# Patient Record
Sex: Female | Born: 1980 | Race: Black or African American | Hispanic: No | Marital: Single | Smoking: Never smoker
Health system: Southern US, Community
[De-identification: ages and names within clinical notes are randomized; demographics above are authoritative.]

## PROBLEM LIST (undated history)

## (undated) ENCOUNTER — Inpatient Hospital Stay (HOSPITAL_COMMUNITY): Payer: Self-pay

## (undated) DIAGNOSIS — G43909 Migraine, unspecified, not intractable, without status migrainosus: Secondary | ICD-10-CM

## (undated) DIAGNOSIS — I10 Essential (primary) hypertension: Secondary | ICD-10-CM

## (undated) HISTORY — PX: CHOLECYSTECTOMY: SHX55

---
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
## 2001-01-02 ENCOUNTER — Encounter: Admission: RE | Admit: 2001-01-02 | Discharge: 2001-01-02 | Payer: Self-pay | Admitting: Family Medicine

## 2001-02-09 ENCOUNTER — Encounter: Admission: RE | Admit: 2001-02-09 | Discharge: 2001-02-09 | Payer: Self-pay | Admitting: Sports Medicine

## 2001-02-13 ENCOUNTER — Emergency Department (HOSPITAL_COMMUNITY): Admission: EM | Admit: 2001-02-13 | Discharge: 2001-02-13 | Payer: Self-pay | Admitting: Emergency Medicine

## 2001-02-21 ENCOUNTER — Encounter: Admission: RE | Admit: 2001-02-21 | Discharge: 2001-02-21 | Payer: Self-pay | Admitting: Sports Medicine

## 2001-03-14 ENCOUNTER — Encounter: Admission: RE | Admit: 2001-03-14 | Discharge: 2001-03-14 | Payer: Self-pay | Admitting: Sports Medicine

## 2001-04-12 ENCOUNTER — Encounter: Admission: RE | Admit: 2001-04-12 | Discharge: 2001-04-12 | Payer: Self-pay | Admitting: Family Medicine

## 2001-04-19 ENCOUNTER — Encounter: Admission: RE | Admit: 2001-04-19 | Discharge: 2001-04-19 | Payer: Self-pay | Admitting: Family Medicine

## 2001-07-14 ENCOUNTER — Encounter: Admission: RE | Admit: 2001-07-14 | Discharge: 2001-07-14 | Payer: Self-pay | Admitting: Family Medicine

## 2001-10-09 ENCOUNTER — Encounter: Admission: RE | Admit: 2001-10-09 | Discharge: 2001-10-09 | Payer: Self-pay | Admitting: Sports Medicine

## 2002-01-01 ENCOUNTER — Encounter: Admission: RE | Admit: 2002-01-01 | Discharge: 2002-01-01 | Payer: Self-pay | Admitting: Family Medicine

## 2002-02-02 ENCOUNTER — Encounter: Admission: RE | Admit: 2002-02-02 | Discharge: 2002-02-02 | Payer: Self-pay | Admitting: Family Medicine

## 2002-03-27 ENCOUNTER — Encounter: Admission: RE | Admit: 2002-03-27 | Discharge: 2002-03-27 | Payer: Self-pay | Admitting: Family Medicine

## 2002-03-29 ENCOUNTER — Encounter: Payer: Self-pay | Admitting: Emergency Medicine

## 2002-03-29 ENCOUNTER — Emergency Department (HOSPITAL_COMMUNITY): Admission: EM | Admit: 2002-03-29 | Discharge: 2002-03-29 | Payer: Self-pay | Admitting: Emergency Medicine

## 2002-06-20 ENCOUNTER — Encounter: Admission: RE | Admit: 2002-06-20 | Discharge: 2002-06-20 | Payer: Self-pay | Admitting: Family Medicine

## 2002-06-25 ENCOUNTER — Encounter: Admission: RE | Admit: 2002-06-25 | Discharge: 2002-06-25 | Payer: Self-pay | Admitting: Family Medicine

## 2002-07-11 ENCOUNTER — Encounter: Admission: RE | Admit: 2002-07-11 | Discharge: 2002-07-11 | Payer: Self-pay | Admitting: Family Medicine

## 2002-08-23 ENCOUNTER — Encounter: Admission: RE | Admit: 2002-08-23 | Discharge: 2002-08-23 | Payer: Self-pay | Admitting: Family Medicine

## 2002-08-29 ENCOUNTER — Encounter: Admission: RE | Admit: 2002-08-29 | Discharge: 2002-08-29 | Payer: Self-pay | Admitting: Family Medicine

## 2002-09-18 ENCOUNTER — Encounter: Admission: RE | Admit: 2002-09-18 | Discharge: 2002-09-18 | Payer: Self-pay | Admitting: Family Medicine

## 2002-10-10 ENCOUNTER — Encounter: Admission: RE | Admit: 2002-10-10 | Discharge: 2002-10-10 | Payer: Self-pay | Admitting: Sports Medicine

## 2002-10-10 ENCOUNTER — Encounter: Payer: Self-pay | Admitting: Sports Medicine

## 2002-10-10 ENCOUNTER — Encounter: Admission: RE | Admit: 2002-10-10 | Discharge: 2002-10-10 | Payer: Self-pay | Admitting: Family Medicine

## 2002-10-11 ENCOUNTER — Encounter: Admission: RE | Admit: 2002-10-11 | Discharge: 2002-10-11 | Payer: Self-pay | Admitting: Family Medicine

## 2002-12-13 ENCOUNTER — Encounter: Admission: RE | Admit: 2002-12-13 | Discharge: 2002-12-13 | Payer: Self-pay | Admitting: Sports Medicine

## 2003-02-28 ENCOUNTER — Encounter: Admission: RE | Admit: 2003-02-28 | Discharge: 2003-02-28 | Payer: Self-pay | Admitting: Family Medicine

## 2003-03-13 ENCOUNTER — Encounter: Admission: RE | Admit: 2003-03-13 | Discharge: 2003-03-13 | Payer: Self-pay | Admitting: Family Medicine

## 2003-05-12 ENCOUNTER — Emergency Department (HOSPITAL_COMMUNITY): Admission: EM | Admit: 2003-05-12 | Discharge: 2003-05-12 | Payer: Self-pay | Admitting: Emergency Medicine

## 2003-05-20 ENCOUNTER — Encounter: Admission: RE | Admit: 2003-05-20 | Discharge: 2003-05-20 | Payer: Self-pay | Admitting: Family Medicine

## 2003-06-26 ENCOUNTER — Encounter: Admission: RE | Admit: 2003-06-26 | Discharge: 2003-06-26 | Payer: Self-pay | Admitting: Family Medicine

## 2003-08-05 ENCOUNTER — Encounter: Admission: RE | Admit: 2003-08-05 | Discharge: 2003-08-05 | Payer: Self-pay | Admitting: Family Medicine

## 2003-10-10 ENCOUNTER — Encounter: Admission: RE | Admit: 2003-10-10 | Discharge: 2003-10-10 | Payer: Self-pay | Admitting: Sports Medicine

## 2003-10-24 ENCOUNTER — Encounter: Admission: RE | Admit: 2003-10-24 | Discharge: 2003-10-24 | Payer: Self-pay | Admitting: Sports Medicine

## 2003-11-18 ENCOUNTER — Encounter: Admission: RE | Admit: 2003-11-18 | Discharge: 2003-11-18 | Payer: Self-pay | Admitting: Family Medicine

## 2003-12-06 ENCOUNTER — Encounter: Admission: RE | Admit: 2003-12-06 | Discharge: 2003-12-06 | Payer: Self-pay | Admitting: Family Medicine

## 2003-12-28 ENCOUNTER — Emergency Department (HOSPITAL_COMMUNITY): Admission: EM | Admit: 2003-12-28 | Discharge: 2003-12-28 | Payer: Self-pay | Admitting: Family Medicine

## 2004-01-22 ENCOUNTER — Encounter: Admission: RE | Admit: 2004-01-22 | Discharge: 2004-01-22 | Payer: Self-pay | Admitting: Sports Medicine

## 2004-02-06 ENCOUNTER — Encounter: Admission: RE | Admit: 2004-02-06 | Discharge: 2004-02-06 | Payer: Self-pay | Admitting: Sports Medicine

## 2004-04-22 ENCOUNTER — Ambulatory Visit: Payer: Self-pay | Admitting: Family Medicine

## 2004-07-22 ENCOUNTER — Ambulatory Visit: Payer: Self-pay | Admitting: Family Medicine

## 2004-10-21 ENCOUNTER — Ambulatory Visit: Payer: Self-pay | Admitting: Family Medicine

## 2005-01-12 ENCOUNTER — Emergency Department (HOSPITAL_COMMUNITY): Admission: EM | Admit: 2005-01-12 | Discharge: 2005-01-12 | Payer: Self-pay | Admitting: Emergency Medicine

## 2005-01-19 ENCOUNTER — Ambulatory Visit: Payer: Self-pay | Admitting: Family Medicine

## 2005-01-28 ENCOUNTER — Ambulatory Visit: Payer: Self-pay | Admitting: Family Medicine

## 2005-04-15 ENCOUNTER — Ambulatory Visit: Payer: Self-pay | Admitting: Family Medicine

## 2005-06-08 ENCOUNTER — Emergency Department (HOSPITAL_COMMUNITY): Admission: EM | Admit: 2005-06-08 | Discharge: 2005-06-08 | Payer: Self-pay | Admitting: Family Medicine

## 2005-07-12 ENCOUNTER — Ambulatory Visit: Payer: Self-pay | Admitting: Family Medicine

## 2005-10-11 ENCOUNTER — Ambulatory Visit: Payer: Self-pay | Admitting: Family Medicine

## 2005-10-22 ENCOUNTER — Ambulatory Visit: Payer: Self-pay | Admitting: Family Medicine

## 2005-12-12 ENCOUNTER — Emergency Department (HOSPITAL_COMMUNITY): Admission: EM | Admit: 2005-12-12 | Discharge: 2005-12-12 | Payer: Self-pay | Admitting: Family Medicine

## 2005-12-28 ENCOUNTER — Ambulatory Visit (HOSPITAL_COMMUNITY): Admission: RE | Admit: 2005-12-28 | Discharge: 2005-12-28 | Payer: Self-pay | Admitting: Family Medicine

## 2005-12-28 ENCOUNTER — Ambulatory Visit: Payer: Self-pay | Admitting: Family Medicine

## 2006-01-03 ENCOUNTER — Ambulatory Visit: Payer: Self-pay | Admitting: Family Medicine

## 2006-01-14 ENCOUNTER — Ambulatory Visit: Payer: Self-pay | Admitting: Sports Medicine

## 2006-03-28 ENCOUNTER — Ambulatory Visit: Payer: Self-pay | Admitting: Sports Medicine

## 2006-04-08 ENCOUNTER — Ambulatory Visit: Payer: Self-pay | Admitting: Family Medicine

## 2006-08-11 DIAGNOSIS — F329 Major depressive disorder, single episode, unspecified: Secondary | ICD-10-CM

## 2006-08-11 DIAGNOSIS — K649 Unspecified hemorrhoids: Secondary | ICD-10-CM

## 2006-08-12 ENCOUNTER — Encounter (INDEPENDENT_AMBULATORY_CARE_PROVIDER_SITE_OTHER): Payer: Self-pay | Admitting: *Deleted

## 2006-09-28 ENCOUNTER — Encounter (INDEPENDENT_AMBULATORY_CARE_PROVIDER_SITE_OTHER): Payer: Self-pay | Admitting: *Deleted

## 2006-09-28 ENCOUNTER — Ambulatory Visit: Payer: Self-pay | Admitting: Family Medicine

## 2006-12-02 ENCOUNTER — Telehealth: Payer: Self-pay | Admitting: *Deleted

## 2006-12-25 ENCOUNTER — Telehealth (INDEPENDENT_AMBULATORY_CARE_PROVIDER_SITE_OTHER): Payer: Self-pay | Admitting: Family Medicine

## 2007-05-10 ENCOUNTER — Encounter (INDEPENDENT_AMBULATORY_CARE_PROVIDER_SITE_OTHER): Payer: Self-pay | Admitting: *Deleted

## 2007-05-10 ENCOUNTER — Ambulatory Visit: Payer: Self-pay | Admitting: Family Medicine

## 2007-05-28 ENCOUNTER — Emergency Department (HOSPITAL_COMMUNITY): Admission: EM | Admit: 2007-05-28 | Discharge: 2007-05-28 | Payer: Self-pay | Admitting: Emergency Medicine

## 2007-06-20 ENCOUNTER — Telehealth (INDEPENDENT_AMBULATORY_CARE_PROVIDER_SITE_OTHER): Payer: Self-pay | Admitting: Family Medicine

## 2007-06-21 ENCOUNTER — Ambulatory Visit: Payer: Self-pay

## 2007-07-14 ENCOUNTER — Encounter: Payer: Self-pay | Admitting: *Deleted

## 2007-07-14 ENCOUNTER — Telehealth: Payer: Self-pay | Admitting: *Deleted

## 2007-07-14 ENCOUNTER — Ambulatory Visit: Payer: Self-pay | Admitting: Family Medicine

## 2007-07-14 ENCOUNTER — Encounter (INDEPENDENT_AMBULATORY_CARE_PROVIDER_SITE_OTHER): Payer: Self-pay | Admitting: Family Medicine

## 2007-07-14 LAB — CONVERTED CEMR LAB
Antibody Screen: NEGATIVE
Basophils Absolute: 0 10*3/uL (ref 0.0–0.1)
Basophils Relative: 0 % (ref 0–1)
Eosinophils Relative: 2 % (ref 0–5)
HCT: 36.2 % (ref 36.0–46.0)
Hemoglobin: 12.1 g/dL (ref 12.0–15.0)
MCHC: 33.4 g/dL (ref 30.0–36.0)
MCV: 87.7 fL (ref 78.0–100.0)
Monocytes Absolute: 1 10*3/uL (ref 0.1–1.0)
Monocytes Relative: 8 % (ref 3–12)
RDW: 14.9 % (ref 11.5–15.5)

## 2007-07-15 ENCOUNTER — Encounter (INDEPENDENT_AMBULATORY_CARE_PROVIDER_SITE_OTHER): Payer: Self-pay | Admitting: Family Medicine

## 2007-07-19 ENCOUNTER — Telehealth: Payer: Self-pay | Admitting: *Deleted

## 2007-07-19 ENCOUNTER — Encounter (INDEPENDENT_AMBULATORY_CARE_PROVIDER_SITE_OTHER): Payer: Self-pay | Admitting: *Deleted

## 2007-07-20 ENCOUNTER — Telehealth: Payer: Self-pay | Admitting: *Deleted

## 2007-07-31 ENCOUNTER — Telehealth: Payer: Self-pay | Admitting: *Deleted

## 2007-08-07 ENCOUNTER — Encounter (INDEPENDENT_AMBULATORY_CARE_PROVIDER_SITE_OTHER): Payer: Self-pay | Admitting: Family Medicine

## 2007-08-07 ENCOUNTER — Ambulatory Visit: Payer: Self-pay | Admitting: Sports Medicine

## 2007-08-07 LAB — CONVERTED CEMR LAB
Chlamydia, DNA Probe: NEGATIVE
Chlamydia, DNA Probe: NEGATIVE
Pap Smear: NORMAL
Protein, U semiquant: NEGATIVE

## 2007-08-10 ENCOUNTER — Encounter (INDEPENDENT_AMBULATORY_CARE_PROVIDER_SITE_OTHER): Payer: Self-pay | Admitting: Family Medicine

## 2007-08-17 ENCOUNTER — Ambulatory Visit (HOSPITAL_COMMUNITY): Admission: RE | Admit: 2007-08-17 | Discharge: 2007-08-17 | Payer: Self-pay | Admitting: Family Medicine

## 2007-08-17 ENCOUNTER — Encounter (INDEPENDENT_AMBULATORY_CARE_PROVIDER_SITE_OTHER): Payer: Self-pay | Admitting: Family Medicine

## 2007-08-23 ENCOUNTER — Encounter (INDEPENDENT_AMBULATORY_CARE_PROVIDER_SITE_OTHER): Payer: Self-pay | Admitting: Family Medicine

## 2007-08-24 ENCOUNTER — Telehealth: Payer: Self-pay | Admitting: *Deleted

## 2007-08-25 ENCOUNTER — Encounter (INDEPENDENT_AMBULATORY_CARE_PROVIDER_SITE_OTHER): Payer: Self-pay | Admitting: *Deleted

## 2007-08-25 ENCOUNTER — Ambulatory Visit: Payer: Self-pay | Admitting: Family Medicine

## 2007-08-25 ENCOUNTER — Telehealth: Payer: Self-pay | Admitting: *Deleted

## 2007-08-25 LAB — CONVERTED CEMR LAB
BUN: 3 mg/dL — ABNORMAL LOW (ref 6–23)
CO2: 20 meq/L (ref 19–32)
Calcium: 8.6 mg/dL (ref 8.4–10.5)
Chloride: 105 meq/L (ref 96–112)
Creatinine, Ser: 0.55 mg/dL (ref 0.40–1.20)
Glucose, Bld: 80 mg/dL (ref 70–99)
Glucose, Urine, Semiquant: NEGATIVE
Nitrite: NEGATIVE
pH: 6.5

## 2007-08-28 ENCOUNTER — Ambulatory Visit: Payer: Self-pay | Admitting: Sports Medicine

## 2007-08-28 LAB — CONVERTED CEMR LAB
Blood in Urine, dipstick: NEGATIVE
Specific Gravity, Urine: >=1.03
pH: 6

## 2007-09-01 ENCOUNTER — Telehealth: Payer: Self-pay | Admitting: *Deleted

## 2007-09-04 ENCOUNTER — Ambulatory Visit: Payer: Self-pay | Admitting: Family Medicine

## 2007-09-04 LAB — CONVERTED CEMR LAB
Blood in Urine, dipstick: NEGATIVE
Nitrite: 0
WBC Urine, dipstick: NEGATIVE
pH: 6

## 2007-09-05 ENCOUNTER — Ambulatory Visit: Payer: Self-pay | Admitting: Family Medicine

## 2007-09-05 LAB — CONVERTED CEMR LAB
Glucose, Urine, Semiquant: NEGATIVE
Specific Gravity, Urine: >=1.03
pH: 6

## 2007-09-07 ENCOUNTER — Ambulatory Visit (HOSPITAL_COMMUNITY): Admission: RE | Admit: 2007-09-07 | Discharge: 2007-09-07 | Payer: Self-pay | Admitting: Family Medicine

## 2007-09-11 ENCOUNTER — Ambulatory Visit: Payer: Self-pay | Admitting: Family Medicine

## 2007-09-11 LAB — CONVERTED CEMR LAB
Glucose, Urine, Semiquant: NEGATIVE
Protein, U semiquant: NEGATIVE

## 2007-09-15 ENCOUNTER — Telehealth (INDEPENDENT_AMBULATORY_CARE_PROVIDER_SITE_OTHER): Payer: Self-pay | Admitting: Family Medicine

## 2007-09-19 ENCOUNTER — Ambulatory Visit: Payer: Self-pay | Admitting: Family Medicine

## 2007-09-19 LAB — CONVERTED CEMR LAB: GTT, 1 hr: 145 mg/dL

## 2007-09-25 ENCOUNTER — Inpatient Hospital Stay (HOSPITAL_COMMUNITY): Admission: AD | Admit: 2007-09-25 | Discharge: 2007-09-25 | Payer: Self-pay | Admitting: Family Medicine

## 2007-09-28 ENCOUNTER — Ambulatory Visit (HOSPITAL_COMMUNITY): Admission: RE | Admit: 2007-09-28 | Discharge: 2007-09-28 | Payer: Self-pay | Admitting: Family Medicine

## 2007-10-02 ENCOUNTER — Ambulatory Visit: Payer: Self-pay | Admitting: Sports Medicine

## 2007-10-02 ENCOUNTER — Ambulatory Visit: Payer: Self-pay | Admitting: Family Medicine

## 2007-10-02 ENCOUNTER — Encounter (INDEPENDENT_AMBULATORY_CARE_PROVIDER_SITE_OTHER): Payer: Self-pay | Admitting: Family Medicine

## 2007-10-02 ENCOUNTER — Encounter (INDEPENDENT_AMBULATORY_CARE_PROVIDER_SITE_OTHER): Payer: Self-pay | Admitting: *Deleted

## 2007-10-02 LAB — CONVERTED CEMR LAB

## 2007-10-04 ENCOUNTER — Telehealth (INDEPENDENT_AMBULATORY_CARE_PROVIDER_SITE_OTHER): Payer: Self-pay | Admitting: Family Medicine

## 2007-10-12 ENCOUNTER — Encounter (INDEPENDENT_AMBULATORY_CARE_PROVIDER_SITE_OTHER): Payer: Self-pay | Admitting: Family Medicine

## 2007-10-12 ENCOUNTER — Ambulatory Visit (HOSPITAL_COMMUNITY): Admission: RE | Admit: 2007-10-12 | Discharge: 2007-10-12 | Payer: Self-pay | Admitting: Sports Medicine

## 2007-10-16 ENCOUNTER — Telehealth (INDEPENDENT_AMBULATORY_CARE_PROVIDER_SITE_OTHER): Payer: Self-pay | Admitting: Family Medicine

## 2007-10-18 ENCOUNTER — Ambulatory Visit: Payer: Self-pay | Admitting: Family Medicine

## 2007-10-18 LAB — CONVERTED CEMR LAB: Glucose, Urine, Semiquant: NEGATIVE

## 2007-10-20 ENCOUNTER — Telehealth (INDEPENDENT_AMBULATORY_CARE_PROVIDER_SITE_OTHER): Payer: Self-pay | Admitting: Family Medicine

## 2007-10-25 ENCOUNTER — Telehealth (INDEPENDENT_AMBULATORY_CARE_PROVIDER_SITE_OTHER): Payer: Self-pay | Admitting: Family Medicine

## 2007-11-01 ENCOUNTER — Telehealth: Payer: Self-pay | Admitting: *Deleted

## 2007-11-02 ENCOUNTER — Encounter (INDEPENDENT_AMBULATORY_CARE_PROVIDER_SITE_OTHER): Payer: Self-pay | Admitting: Family Medicine

## 2007-11-03 ENCOUNTER — Telehealth: Payer: Self-pay | Admitting: *Deleted

## 2007-11-04 ENCOUNTER — Telehealth (INDEPENDENT_AMBULATORY_CARE_PROVIDER_SITE_OTHER): Payer: Self-pay | Admitting: Family Medicine

## 2007-11-08 ENCOUNTER — Ambulatory Visit: Payer: Self-pay | Admitting: Family Medicine

## 2007-11-13 ENCOUNTER — Telehealth: Payer: Self-pay | Admitting: *Deleted

## 2007-11-15 ENCOUNTER — Ambulatory Visit: Payer: Self-pay | Admitting: Family Medicine

## 2007-11-15 ENCOUNTER — Telehealth: Payer: Self-pay | Admitting: *Deleted

## 2007-11-16 ENCOUNTER — Ambulatory Visit: Payer: Self-pay | Admitting: Family Medicine

## 2007-11-16 LAB — CONVERTED CEMR LAB: Glucose, Urine, Semiquant: NEGATIVE

## 2007-11-24 ENCOUNTER — Ambulatory Visit: Payer: Self-pay | Admitting: Obstetrics & Gynecology

## 2007-11-24 ENCOUNTER — Inpatient Hospital Stay (HOSPITAL_COMMUNITY): Admission: AD | Admit: 2007-11-24 | Discharge: 2007-11-24 | Payer: Self-pay | Admitting: Family Medicine

## 2007-12-01 ENCOUNTER — Ambulatory Visit: Payer: Self-pay | Admitting: Family Medicine

## 2007-12-01 ENCOUNTER — Telehealth: Payer: Self-pay | Admitting: *Deleted

## 2007-12-12 ENCOUNTER — Encounter (INDEPENDENT_AMBULATORY_CARE_PROVIDER_SITE_OTHER): Payer: Self-pay | Admitting: Family Medicine

## 2007-12-12 ENCOUNTER — Ambulatory Visit: Payer: Self-pay | Admitting: Family Medicine

## 2007-12-12 LAB — CONVERTED CEMR LAB
GTT, 1 hr: 136 mg/dL
Glucose, Urine, Semiquant: NEGATIVE
Hemoglobin: 10.2 g/dL

## 2007-12-13 ENCOUNTER — Encounter (INDEPENDENT_AMBULATORY_CARE_PROVIDER_SITE_OTHER): Payer: Self-pay | Admitting: Family Medicine

## 2007-12-13 ENCOUNTER — Ambulatory Visit: Payer: Self-pay | Admitting: Family Medicine

## 2007-12-28 ENCOUNTER — Telehealth: Payer: Self-pay | Admitting: *Deleted

## 2007-12-29 ENCOUNTER — Encounter (INDEPENDENT_AMBULATORY_CARE_PROVIDER_SITE_OTHER): Payer: Self-pay | Admitting: Family Medicine

## 2007-12-29 ENCOUNTER — Ambulatory Visit: Payer: Self-pay | Admitting: Family Medicine

## 2008-01-01 ENCOUNTER — Telehealth: Payer: Self-pay | Admitting: *Deleted

## 2008-01-01 ENCOUNTER — Ambulatory Visit: Payer: Self-pay | Admitting: Family Medicine

## 2008-01-11 ENCOUNTER — Telehealth (INDEPENDENT_AMBULATORY_CARE_PROVIDER_SITE_OTHER): Payer: Self-pay | Admitting: *Deleted

## 2008-01-16 ENCOUNTER — Ambulatory Visit: Payer: Self-pay | Admitting: Family Medicine

## 2008-01-19 ENCOUNTER — Ambulatory Visit: Payer: Self-pay | Admitting: Family Medicine

## 2008-01-19 ENCOUNTER — Telehealth: Payer: Self-pay | Admitting: *Deleted

## 2008-01-19 ENCOUNTER — Encounter: Payer: Self-pay | Admitting: Family Medicine

## 2008-01-19 LAB — CONVERTED CEMR LAB
Chlamydia, DNA Probe: NEGATIVE
GC Probe Amp, Genital: NEGATIVE
Glucose, Urine, Semiquant: NEGATIVE
Protein, U semiquant: NEGATIVE

## 2008-01-24 ENCOUNTER — Encounter: Payer: Self-pay | Admitting: Family Medicine

## 2008-02-06 ENCOUNTER — Encounter (INDEPENDENT_AMBULATORY_CARE_PROVIDER_SITE_OTHER): Payer: Self-pay | Admitting: Family Medicine

## 2008-02-06 ENCOUNTER — Ambulatory Visit: Payer: Self-pay | Admitting: Family Medicine

## 2008-02-06 LAB — CONVERTED CEMR LAB

## 2008-02-15 ENCOUNTER — Encounter (INDEPENDENT_AMBULATORY_CARE_PROVIDER_SITE_OTHER): Payer: Self-pay | Admitting: Family Medicine

## 2008-02-15 ENCOUNTER — Ambulatory Visit: Payer: Self-pay | Admitting: Family Medicine

## 2008-02-20 ENCOUNTER — Ambulatory Visit: Payer: Self-pay | Admitting: Family Medicine

## 2008-02-20 LAB — CONVERTED CEMR LAB: Glucose, Urine, Semiquant: NEGATIVE

## 2008-02-28 ENCOUNTER — Telehealth: Payer: Self-pay | Admitting: *Deleted

## 2008-02-29 ENCOUNTER — Ambulatory Visit: Payer: Self-pay | Admitting: Family Medicine

## 2008-02-29 LAB — CONVERTED CEMR LAB

## 2008-03-01 ENCOUNTER — Encounter (INDEPENDENT_AMBULATORY_CARE_PROVIDER_SITE_OTHER): Payer: Self-pay | Admitting: Family Medicine

## 2008-03-01 ENCOUNTER — Ambulatory Visit (HOSPITAL_COMMUNITY): Admission: RE | Admit: 2008-03-01 | Discharge: 2008-03-01 | Payer: Self-pay | Admitting: Family Medicine

## 2008-03-06 ENCOUNTER — Ambulatory Visit: Payer: Self-pay | Admitting: Family Medicine

## 2008-03-06 LAB — CONVERTED CEMR LAB: Protein, U semiquant: NEGATIVE

## 2008-03-07 ENCOUNTER — Ambulatory Visit: Payer: Self-pay | Admitting: Family Medicine

## 2008-03-07 ENCOUNTER — Encounter (INDEPENDENT_AMBULATORY_CARE_PROVIDER_SITE_OTHER): Payer: Self-pay | Admitting: Family Medicine

## 2008-03-09 ENCOUNTER — Ambulatory Visit: Payer: Self-pay | Admitting: Physician Assistant

## 2008-03-09 ENCOUNTER — Inpatient Hospital Stay (HOSPITAL_COMMUNITY): Admission: AD | Admit: 2008-03-09 | Discharge: 2008-03-10 | Payer: Self-pay | Admitting: Obstetrics & Gynecology

## 2008-03-11 ENCOUNTER — Encounter (INDEPENDENT_AMBULATORY_CARE_PROVIDER_SITE_OTHER): Payer: Self-pay | Admitting: Family Medicine

## 2008-03-12 ENCOUNTER — Telehealth (INDEPENDENT_AMBULATORY_CARE_PROVIDER_SITE_OTHER): Payer: Self-pay | Admitting: Family Medicine

## 2008-03-15 ENCOUNTER — Ambulatory Visit: Payer: Self-pay | Admitting: Family Medicine

## 2008-04-25 ENCOUNTER — Encounter (INDEPENDENT_AMBULATORY_CARE_PROVIDER_SITE_OTHER): Payer: Self-pay | Admitting: *Deleted

## 2008-06-04 ENCOUNTER — Ambulatory Visit: Payer: Self-pay | Admitting: Family Medicine

## 2008-06-21 ENCOUNTER — Telehealth: Payer: Self-pay | Admitting: *Deleted

## 2008-06-21 ENCOUNTER — Ambulatory Visit: Payer: Self-pay | Admitting: Family Medicine

## 2008-06-21 DIAGNOSIS — K219 Gastro-esophageal reflux disease without esophagitis: Secondary | ICD-10-CM

## 2008-07-09 ENCOUNTER — Telehealth (INDEPENDENT_AMBULATORY_CARE_PROVIDER_SITE_OTHER): Payer: Self-pay | Admitting: Family Medicine

## 2008-07-10 ENCOUNTER — Ambulatory Visit: Payer: Self-pay | Admitting: Family Medicine

## 2008-07-26 ENCOUNTER — Telehealth (INDEPENDENT_AMBULATORY_CARE_PROVIDER_SITE_OTHER): Payer: Self-pay | Admitting: Family Medicine

## 2008-07-27 ENCOUNTER — Emergency Department (HOSPITAL_COMMUNITY): Admission: EM | Admit: 2008-07-27 | Discharge: 2008-07-27 | Payer: Self-pay | Admitting: Family Medicine

## 2008-07-30 ENCOUNTER — Telehealth: Payer: Self-pay | Admitting: *Deleted

## 2008-08-02 ENCOUNTER — Ambulatory Visit: Payer: Self-pay | Admitting: Family Medicine

## 2008-08-02 DIAGNOSIS — E669 Obesity, unspecified: Secondary | ICD-10-CM | POA: Insufficient documentation

## 2008-08-02 LAB — CONVERTED CEMR LAB
Glucose, Urine, Semiquant: NEGATIVE
Ketones, urine, test strip: NEGATIVE
Protein, U semiquant: 30
Specific Gravity, Urine: >=1.03
pH: 5.5

## 2008-08-23 ENCOUNTER — Telehealth: Payer: Self-pay | Admitting: *Deleted

## 2008-08-30 ENCOUNTER — Ambulatory Visit: Payer: Self-pay | Admitting: Family Medicine

## 2008-10-31 ENCOUNTER — Ambulatory Visit: Payer: Self-pay | Admitting: Family Medicine

## 2008-10-31 DIAGNOSIS — J309 Allergic rhinitis, unspecified: Secondary | ICD-10-CM | POA: Insufficient documentation

## 2008-11-13 ENCOUNTER — Telehealth: Payer: Self-pay | Admitting: *Deleted

## 2008-11-13 ENCOUNTER — Ambulatory Visit: Payer: Self-pay | Admitting: Family Medicine

## 2008-11-20 ENCOUNTER — Encounter (INDEPENDENT_AMBULATORY_CARE_PROVIDER_SITE_OTHER): Payer: Self-pay | Admitting: Family Medicine

## 2008-11-28 ENCOUNTER — Ambulatory Visit: Payer: Self-pay | Admitting: Family Medicine

## 2009-02-10 ENCOUNTER — Emergency Department (HOSPITAL_COMMUNITY): Admission: EM | Admit: 2009-02-10 | Discharge: 2009-02-10 | Payer: Self-pay | Admitting: Emergency Medicine

## 2009-02-17 ENCOUNTER — Emergency Department (HOSPITAL_COMMUNITY): Admission: EM | Admit: 2009-02-17 | Discharge: 2009-02-17 | Payer: Self-pay | Admitting: Family Medicine

## 2009-02-20 ENCOUNTER — Ambulatory Visit: Payer: Self-pay | Admitting: Family Medicine

## 2009-05-14 ENCOUNTER — Ambulatory Visit: Payer: Self-pay | Admitting: Family Medicine

## 2009-06-06 ENCOUNTER — Emergency Department (HOSPITAL_COMMUNITY): Admission: EM | Admit: 2009-06-06 | Discharge: 2009-06-06 | Payer: Self-pay | Admitting: Emergency Medicine

## 2009-06-21 ENCOUNTER — Telehealth: Payer: Self-pay | Admitting: Family Medicine

## 2009-08-08 ENCOUNTER — Ambulatory Visit: Payer: Self-pay | Admitting: Family Medicine

## 2009-08-21 ENCOUNTER — Ambulatory Visit: Payer: Self-pay | Admitting: Family Medicine

## 2009-09-15 ENCOUNTER — Telehealth: Payer: Self-pay | Admitting: Family Medicine

## 2009-11-04 ENCOUNTER — Ambulatory Visit: Payer: Self-pay | Admitting: Family Medicine

## 2009-11-04 ENCOUNTER — Encounter: Payer: Self-pay | Admitting: Family Medicine

## 2009-11-04 DIAGNOSIS — M940 Chondrocostal junction syndrome [Tietze]: Secondary | ICD-10-CM | POA: Insufficient documentation

## 2010-01-30 ENCOUNTER — Ambulatory Visit: Payer: Self-pay | Admitting: Family Medicine

## 2010-02-09 ENCOUNTER — Telehealth: Payer: Self-pay | Admitting: Family Medicine

## 2010-02-09 DIAGNOSIS — K089 Disorder of teeth and supporting structures, unspecified: Secondary | ICD-10-CM | POA: Insufficient documentation

## 2010-03-10 ENCOUNTER — Encounter: Payer: Self-pay | Admitting: Family Medicine

## 2010-03-10 ENCOUNTER — Emergency Department (HOSPITAL_COMMUNITY): Admission: EM | Admit: 2010-03-10 | Discharge: 2010-03-10 | Payer: Self-pay | Admitting: Family Medicine

## 2010-03-13 ENCOUNTER — Ambulatory Visit: Payer: Self-pay | Admitting: Family Medicine

## 2010-03-13 ENCOUNTER — Telehealth: Payer: Self-pay | Admitting: *Deleted

## 2010-03-13 DIAGNOSIS — L03019 Cellulitis of unspecified finger: Secondary | ICD-10-CM | POA: Insufficient documentation

## 2010-03-15 ENCOUNTER — Emergency Department (HOSPITAL_COMMUNITY): Admission: EM | Admit: 2010-03-15 | Discharge: 2010-03-15 | Payer: Self-pay | Admitting: Emergency Medicine

## 2010-03-18 ENCOUNTER — Telehealth: Payer: Self-pay | Admitting: Family Medicine

## 2010-03-29 ENCOUNTER — Encounter: Payer: Self-pay | Admitting: Family Medicine

## 2010-04-23 ENCOUNTER — Ambulatory Visit: Payer: Self-pay | Admitting: Family Medicine

## 2010-06-28 ENCOUNTER — Emergency Department (HOSPITAL_COMMUNITY)
Admission: EM | Admit: 2010-06-28 | Discharge: 2010-06-28 | Payer: Self-pay | Source: Home / Self Care | Admitting: Family Medicine

## 2010-07-10 ENCOUNTER — Ambulatory Visit: Admit: 2010-07-10 | Payer: Self-pay

## 2010-07-14 NOTE — Progress Notes (Signed)
Summary: Emergency call: skin irritation   Phone Note Outgoing Call   Call placed by: Cat Ta MD,  March 18, 2010 6:59 PM Call placed to: Patient Summary of Call: Pt changed soap two days ago and now has skin irritation in vaginal area.  No vaginal discharge.  No bleeding.  On depot for bc.  Not pregnant.  Advised that she needs to stop using the soap.  If irritation continues then she can come in to be seen. Told her that this is Emergency line that this is not an emergency issue.  Initial call taken by: Cat Ta MD,  March 18, 2010 7:03 PM

## 2010-07-14 NOTE — Assessment & Plan Note (Signed)
Summary: depo/bmc   Nurse Visit   Allergies: No Known Drug Allergies  Medication Administration  Injection # 1:    Medication: Depo-Provera 150mg     Diagnosis: CONTRACEPTIVE MANAGEMENT (ICD-V25.09)    Route: IM    Site: RUOQ gluteus    Exp Date: 08/2012    Lot #: [id number]    Mfr: [email or web address]    Comments: next depo due Jan 26 thru Jul 23, 2010    Patient tolerated injection without complications    Given by: Theresia Lo RN (April 23, 2010 12:16 PM)  Orders Added: 1)  Depo-Provera 150mg  [J1055] 2)  Admin of Injection (IM/SQ) [04540]   Medication Administration  Injection # 1:    Medication: Depo-Provera 150mg     Diagnosis: CONTRACEPTIVE MANAGEMENT (ICD-V25.09)    Route: IM    Site: RUOQ gluteus    Exp Date: 08/2012    Lot #: [id number]    Mfr: [email or web address]    Comments: next depo due Jan 26 thru Jul 23, 2010    Patient tolerated injection without complications    Given by: Theresia Lo RN (April 23, 2010 12:16 PM)  Orders Added: 1)  Depo-Provera 150mg  [J1055] 2)  Admin of Injection (IM/SQ) [47829]  .  patient's last pap was 08/08/2007. Consulted with Dr. Mauricio Po and he advised to give depo today and schedule appointment for CPE and pap.  appointment scheduled for CPE for 05/06/2010 with Dr. Earlene Plater.  Theresia Lo RN  April 23, 2010 12:17 PM

## 2010-07-14 NOTE — Assessment & Plan Note (Signed)
Summary: hand pain,df   Vital Signs:  Patient profile:   30 year old female Height:      62 inches Weight:      201 pounds BMI:     36.90 Temp:     98.2 degrees F oral Pulse rate:   79 / minute BP sitting:   132 / 83  (left arm) Cuff size:   large  Vitals Entered By: Tessie Fass CMA (August 21, 2009 2:54 PM) CC: right hand pain x 3 days Is Patient Diabetic? No Pain Assessment Patient in pain? yes     Location: right hand Intensity: 5   Primary Care Provider:  Ardeen Garland  MD  CC:  right hand pain x 3 days.  History of Present Illness: Erin Osborn comes in today for right middle finger pain since Tuesday.  Woke up with "soreness" in MTP and PIP joint.  NO swelling.  No redness.  No known injury.  Has not tried anything for the pain.  Works in housekeeping, uses hands a lot, but no more than usual lately.   Also endorses heartburn, foul taste in her mouth, and nausea.  Used to take heartburn medicine but hasn't for a long time.  No blood in stool or dark, tarry stools.   Habits & Providers  Alcohol-Tobacco-Diet     Tobacco Status: never  Allergies: No Known Drug Allergies  Physical Exam  General:  obese, alert, NAD vitals reviewed Abdomen:  obese, soft, nontender, nondistended Extremities:  right middle finger with no swelling or erythema.  Pain with flexion at MCP and PIP joint though FROM.  No bruising.  Full strength of all fingers and hand.  NVI distally.   Impression & Recommendations:  Problem # 1:  FINGER PAIN (ICD-729.5) Assessment New  No red flags.  Possible tendinitis.  Will try short course of HD anti-inflammatory with ice.  May splint for comfort.  If does not improve or worsens, consider arthritis work-up.   Orders: [facility]- Est  Level 4 (01093)  Problem # 2:  GERD (ICD-530.81) Assessment: Deteriorated  Start Nexium.  Her updated medication list for this problem includes:    Nexium 40 Mg Cpdr (Esomeprazole magnesium) .Marland Kitchen... 1 tab by mouth daily  for acid reflux  Orders: [facility]- Est  Level 4 (23557)  Complete Medication List: 1)  Ibuprofen 800 Mg Tabs (Ibuprofen) .Marland Kitchen.. 1 tab by mouth three times a day with food for 10 days 2)  Nexium 40 Mg Cpdr (Esomeprazole magnesium) .Marland Kitchen.. 1 tab by mouth daily for acid reflux  Patient Instructions: 1)  Use the ibuprofen, with food, 3 times a day for the next 10 days.   2)  Use ice to that finger at least 1-2 times a day.   3)  You can use a splint if you like, but I wouldn't use it for more than 3-5 days or it may cause the finger to stiffen up.  4)  Start the nexium for your stomach and see if it helps the nausea and bad taste in your mouth. 5)  Schedule a full physical on your way out today.  Prescriptions: NEXIUM 40 MG CPDR (ESOMEPRAZOLE MAGNESIUM) 1 tab by mouth daily for acid reflux  #30 x 6   Entered and Authorized by:   Ardeen Garland  MD   Signed by:   Ardeen Garland  MD on 08/21/2009   Method used:   Print then Give to Patient   RxID:   [id number] IBUPROFEN 800 MG TABS (IBUPROFEN) 1  tab by mouth three times a day with food for 10 days  #30 x 1   Entered and Authorized by:   Ardeen Garland  MD   Signed by:   Ardeen Garland  MD on 08/21/2009   Method used:   Print then Give to Patient   RxID:   [id number]

## 2010-07-14 NOTE — Miscellaneous (Signed)
Summary: Asthma Update - Remove Dx

## 2010-07-14 NOTE — Progress Notes (Signed)
Summary: referral   Phone Note Call from Patient Call back at [phone]   Caller: Patient Summary of Call: needs referral to dental clinic Initial call taken by: De Nurse,  February 09, 2010 10:40 AM  Follow-up for Phone Call        spoke with patient and she has actually 2 teeth  hurting . started 2 days ago . she takes ibuprofen and this helps some. explained to her how the [facility]  works and that we have sent in our 10 allotment for the month of August. recommended she come in here to see the doctor and if she requires antibiotic and  pain  med we can ask for an urgent appointment. she can't come today or tomorrow. explained that we can send in referral but it will be Sept 1 before referral can be sent and may be several weeks before appointment can be made unless it requires urgent attention and we'll not know about that unless she comes here to examined. she may call back tomorrow for a workin appointment. Follow-up by: Theresia Lo RN,  February 09, 2010 11:30 AM  New Problems: DENTAL PAIN (ICD-525.9)   New Problems: DENTAL PAIN (ICD-525.9)  Appended Document: referral spoke with patient and explained that we will send in referral tomorrow. states she took ibuprofen and it helped the pain. again advised if needed to  call for appointment to be evaluated sooner for  more more acute dental problem and possibly more urgent appointment with dental clinic .

## 2010-07-14 NOTE — Progress Notes (Signed)
Summary: phone call about viral gastroenteritis    Pt says she is having diarrhea, stomach pain and vomiting. Hasn't eaten anything since last night (ate chicken and pepsi)  but vomited them up at 2am this morning. She is keeping down Ginger Ale. Her son brought this homr from school and now she and her 29 year old have it. Gave her precautions about when to come into the ED because of signs of dehydration. Told her she could drink some of the Pedialyte too if she wanted. It has electrolytes she needs. Pt voicese understanding.   Jamie Brookes MD  June 21, 2009 9:19 AM

## 2010-07-14 NOTE — Miscellaneous (Signed)
Summary: c/o carpel tunnell   Clinical Lists Changes states her hand is hurting her a lot & that she has carpel tunnel. wants to be seen now. told her we have no openings left & suggested UC. she agreed.Golden Circle RN  March 10, 2010 11:29 AM

## 2010-07-14 NOTE — Assessment & Plan Note (Signed)
Vital Signs:  Patient profile:   30 year old female Height:      62 inches Weight:      200 pounds BMI:     36.71 Temp:     98.1 degrees F oral Pulse rate:   71 / minute BP sitting:   114 / 79  (right arm) Cuff size:   large  Vitals Entered By: Tessie Fass CMA (Nov 04, 2009 11:13 AM) CC: soreness across chest x 3 days Is Patient Diabetic? No   Primary Care Provider:  Ardeen Garland  MD  CC:  soreness across chest x 3 days.  History of Present Illness: Chest wall pain for 2 days, did not do anything strenuous.  Has a two year old but does not carry her.  Habits & Providers  Alcohol-Tobacco-Diet     Tobacco Status: never  Current Medications (verified): 1)  Ibuprofen 800 Mg Tabs (Ibuprofen) .Marland Kitchen.. 1 Tab By Mouth Three Times A Day For 1-2 Weeks Then As Needed, 2)  Nexium 40 Mg Cpdr (Esomeprazole Magnesium) .Marland Kitchen.. 1 Tab By Mouth Daily For Acid Reflux  Allergies: No Known Drug Allergies  Review of Systems General:  Denies fever. CV:  Denies shortness of breath with exertion and swelling of feet.  Physical Exam  General:  Well-developed,well-nourished,in no acute distress; alert,appropriate and cooperative throughout examination Chest Wall:  Tender costochondral margins of chest Lungs:  normal respiratory effort and normal breath sounds.   Heart:  normal rate and regular rhythm.     Impression & Recommendations:  Problem # 1:  COSTOCHONDRITIS, ACUTE (ICD-733.6)  Ibuprofen 800 mg three times a day for one week then as needed, ice and heat, gave handout on condition  Orders: [facility]- Est Level  3 (16109)  Problem # 2:  CONTRACEPTIVE MANAGEMENT (ICD-V25.09)  Orders: Depo-Provera 150mg  (J1055) [facility]- Est Level  3 (60454)  Complete Medication List: 1)  Ibuprofen 800 Mg Tabs (Ibuprofen) .Marland Kitchen.. 1 tab by mouth three times a day for 1-2 weeks then as needed, 2)  Nexium 40 Mg Cpdr (Esomeprazole magnesium) .Marland Kitchen.. 1 tab by mouth daily for acid reflux  Patient Instructions: 1)   you condition is known as costochondritis, it will get better 2)  Use the ibuprofen three times a day for 1-2 weeks then as needed 3)  use ice and heat to help Prescriptions: IBUPROFEN 800 MG TABS (IBUPROFEN) 1 tab by mouth three times a day for 1-2 weeks then as needed,  #50 x 1   Entered and Authorized by:   Luretha Murphy NP   Signed by:   Luretha Murphy NP on 11/04/2009   Method used:   Electronically to        [facility]. [phone]* (retail)       [address], [place]  [address]       Ph: [phone] or [phone]       Fax: [phone]   RxID:   [id number]    Medication Administration  Injection # 1:    Medication: Depo-Provera 150mg     Diagnosis: CONTRACEPTIVE MANAGEMENT (ICD-V25.09)    Route: IM    Site: L deltoid    Exp Date: 09/2011    Lot #: [id number]    Mfr: Pharmacia    Comments: NEXT DEPO DUE 01/20/2010 THROUGH 02/03/2010    Patient tolerated injection without complications    Given by: Tessie Fass CMA (Nov 04, 2009 11:40 AM)  Orders Added: 1)  Depo-Provera 150mg  [J1055] 2)  [facility]- Est Level  3 [16109]

## 2010-07-14 NOTE — Assessment & Plan Note (Signed)
Summary: depo,df   Nurse Visit   Allergies: No Known Drug Allergies  Medication Administration  Injection # 1:    Medication: Depo-Provera 150mg     Diagnosis: CONTRACEPTIVE MANAGEMENT (ICD-V25.09)    Route: IM    Site: R deltoid    Exp Date: 08/2012    Lot #: [id number]    Mfr: [email or web address]    Comments: Next depo due Nov 4 thru May 01, 2010    Patient tolerated injection without complications    Given by: Theresia Lo RN (January 30, 2010 3:52 PM)  Orders Added: 1)  Depo-Provera 150mg  [J1055] 2)  Admin of Injection (IM/SQ) [62952]   Medication Administration  Injection # 1:    Medication: Depo-Provera 150mg     Diagnosis: CONTRACEPTIVE MANAGEMENT (ICD-V25.09)    Route: IM    Site: R deltoid    Exp Date: 08/2012    Lot #: [id number]    Mfr: [email or web address]    Comments: Next depo due Nov 4 thru May 01, 2010    Patient tolerated injection without complications    Given by: Theresia Lo RN (January 30, 2010 3:52 PM)  Orders Added: 1)  Depo-Provera 150mg  [J1055] 2)  Admin of Injection (IM/SQ) [40102]

## 2010-07-14 NOTE — Assessment & Plan Note (Signed)
Summary: WI for blisters to right hand/kf   Vital Signs:  Patient profile:   30 year old female Height:      62 inches Weight:      208 pounds BMI:     38.18 Temp:     98.0 degrees F oral Pulse rate:   64 / minute BP sitting:   123 / 80  (left arm) Cuff size:   large  Vitals Entered By: Jimmy Footman, CMA (March 13, 2010 10:10 AM) CC: right 3rd finger pain Is Patient Diabetic? No Pain Assessment Patient in pain? yes     Location: right hand Intensity: 5 Type: sharp   Primary Care Provider:  Ardeen Garland  MD  CC:  right 3rd finger pain.  History of Present Illness: 30 yo F:  1. Finger Pain: Right, 3rd digit, distally, x 2 days, worse with palpation, not improving. Tx - 1 day of finger soak in EOTH/H20 combo. + nail-biter. This has happened before to another finger on the same hand. Job - cleaning houses. No fever/chills, N/V/D, LE edema, other rash. Normal sensation to hand. Was seen in [facility] for right wrist pain this week - Dx with carpal tunnel. Rx Mobic and brace. Feeling better.  Habits & Providers  Alcohol-Tobacco-Diet     Tobacco Status: never  Current Medications (verified): 1)  Nexium 40 Mg Cpdr (Esomeprazole Magnesium) .Marland Kitchen.. 1 Tab By Mouth Daily For Acid Reflux 2)  Depo-Provera 150 Mg/ml Susp (Medroxyprogesterone Acetate) .... Q 3 Months Per Protocol 3)  Baciguent 500 Unit/gm Oint (Bacitracin) .... Apply To Nail Bed Four Times A Day After Soaking Per Instructions 4)  Meloxicam 15 Mg Tabs (Meloxicam) .... One By Mouth Daily  Allergies (verified): No Known Drug Allergies PMH-FH-SH reviewed for relevance  Review of Systems      See HPI  Physical Exam  General:  Well-developed, well-nourished, in no acute distress; alert, appropriate and cooperative throughout examination. Vitals reviewed. Skin:  Right 3rd digit with mild edema to distal end, + ttp along lateral nail bed. No drainage. No fluctuance. No open lesions. + DP.   Impression &  Recommendations:  Problem # 1:  PARONYCHIA, FINGER (ICD-681.02) Assessment New  No red flags. Paronchyia considered mild to moderate - characterized by minimal to moderate pain, little erythema, and no discharge. Will treat conservatively. See Patient Instructions. Follow up instructions provided.  Orders: [facility]- Est Level  3 (11914)  Complete Medication List: 1)  Nexium 40 Mg Cpdr (Esomeprazole magnesium) .Marland Kitchen.. 1 tab by mouth daily for acid reflux 2)  Depo-provera 150 Mg/ml Susp (Medroxyprogesterone acetate) .... Q 3 months per protocol 3)  Baciguent 500 Unit/gm Oint (Bacitracin) .... Apply to nail bed four times a day after soaking per instructions 4)  Meloxicam 15 Mg Tabs (Meloxicam) .... One by mouth daily  Patient Instructions: 1)  It was nice to meet you today! 2)  You have an infection next to your nail bed. Continue your water-peroxide soak four times a day. After, apply the Bacitracin - push this into your skin. If your finger gets worse or starts to drain pus, please follow up. Try not to bite your fingernails as this worsens the condition. Prescriptions: BACIGUENT 500 UNIT/GM OINT (BACITRACIN) apply to nail bed four times a day after soaking per instructions  #1 x 2   Entered and Authorized by:   Helane Rima DO   Signed by:   Helane Rima DO on 03/13/2010   Method used:   Print then Give to  Patient   RxID:   [id number]

## 2010-07-14 NOTE — Progress Notes (Signed)
Summary: triage   Phone Note Call from Patient Call back at [phone]   Caller: Patient Summary of Call: Pt's hand is getting blisters on them and hurting very bad.  Can she be seen today. Initial call taken by: Clydell Hakim,  March 13, 2010 9:04 AM  Follow-up for Phone Call        Fluid filled blisters on side of 2nd finger on right hand.  They do not itch and are very painful.  Advised her to come in around 10:30. Follow-up by: Dennison Nancy RN,  March 13, 2010 9:22 AM

## 2010-07-14 NOTE — Assessment & Plan Note (Signed)
Summary: depo,df   Nurse Visit   Allergies: No Known Drug Allergies  Medication Administration  Injection # 1:    Medication: Depo-Provera 150mg     Diagnosis: CONTRACEPTIVE MANAGEMENT (ICD-V25.09)    Route: IM    Site: L deltoid    Exp Date: 10/2011    Lot #: [id number]    Mfr: [email or web address]    Comments: next Depo due  May 13 thru May 25    Patient tolerated injection without complications    Given by: Theresia Lo RN (August 08, 2009 2:33 PM)  Orders Added: 1)  Depo-Provera 150mg  [J1055] 2)  Admin of Injection (IM/SQ) [81191]   Medication Administration  Injection # 1:    Medication: Depo-Provera 150mg     Diagnosis: CONTRACEPTIVE MANAGEMENT (ICD-V25.09)    Route: IM    Site: L deltoid    Exp Date: 10/2011    Lot #: [id number]    Mfr: greenstone    Comments: next Depo due  May 13 thru May 25    Patient tolerated injection without complications    Given by: Theresia Lo RN (August 08, 2009 2:33 PM)  Orders Added: 1)  Depo-Provera 150mg  [J1055] 2)  Admin of Injection (IM/SQ) [62130]

## 2010-07-14 NOTE — Progress Notes (Signed)
Summary: triage   Phone Note Call from Patient Call back at Home Phone [phone]   Caller: Patient Summary of Call: Has crick in her neck. Initial call taken by: Clydell Hakim,  September 15, 2009 4:02 PM  Follow-up for Phone Call        woke up with this today. took ibuprofen at 7am & 3pm. wanted to know if a heating pad or hot shower would help. yold her ok to try. she declined an appt. asked her to call early tomorrow if she changes her mind. able to move neck but painful on r side going into shoulder Follow-up by: Golden Circle RN,  September 15, 2009 4:12 PM

## 2010-07-23 ENCOUNTER — Encounter: Payer: Self-pay | Admitting: *Deleted

## 2010-07-23 ENCOUNTER — Ambulatory Visit (INDEPENDENT_AMBULATORY_CARE_PROVIDER_SITE_OTHER): Payer: Medicaid Other | Admitting: *Deleted

## 2010-07-23 DIAGNOSIS — Z309 Encounter for contraceptive management, unspecified: Secondary | ICD-10-CM

## 2010-07-23 MED ORDER — MEDROXYPROGESTERONE ACETATE 150 MG/ML IM SUSP
150.0000 mg | Freq: Once | INTRAMUSCULAR | Status: AC
  Administered 2010-07-23: 150 mg via INTRAMUSCULAR

## 2010-08-06 ENCOUNTER — Inpatient Hospital Stay (INDEPENDENT_AMBULATORY_CARE_PROVIDER_SITE_OTHER)
Admission: RE | Admit: 2010-08-06 | Discharge: 2010-08-06 | Disposition: A | Discharge status: Home / Self Care | Payer: Medicaid Other | Source: Ambulatory Visit | Attending: Family Medicine | Admitting: Family Medicine

## 2010-08-06 DIAGNOSIS — M545 Low back pain, unspecified: Secondary | ICD-10-CM

## 2010-08-23 ENCOUNTER — Inpatient Hospital Stay (INDEPENDENT_AMBULATORY_CARE_PROVIDER_SITE_OTHER)
Admission: RE | Admit: 2010-08-23 | Discharge: 2010-08-23 | Disposition: A | Discharge status: Home / Self Care | Payer: Medicaid Other | Source: Ambulatory Visit | Attending: Emergency Medicine | Admitting: Emergency Medicine

## 2010-08-23 ENCOUNTER — Telehealth: Payer: Self-pay | Admitting: Family Medicine

## 2010-08-23 DIAGNOSIS — H109 Unspecified conjunctivitis: Secondary | ICD-10-CM

## 2010-08-23 NOTE — Telephone Encounter (Signed)
Woke up this morning with some crusting, now it's really red, no mucus in the corner of her eye,  no stinging or burning, no vision changes. Pt wants to be seen tomorrow morning. Does not want to miss work. Pt wants a Rx for the pink eye tonight but explained to her that she needs to be seen in the urgent care or clinic. Pt wants to be seen in clinic tomorrow morning. Encouraged her to call at 8:30 and we will try to call her to make a same day appointment. Pt agrees.

## 2010-08-25 NOTE — Telephone Encounter (Signed)
Pt did not call as of 08/25/10

## 2010-09-19 LAB — HEPATIC FUNCTION PANEL
AST: 18 U/L (ref 0–37)
Albumin: 3.5 g/dL (ref 3.5–5.2)
Total Bilirubin: 0.6 mg/dL (ref 0.3–1.2)

## 2010-09-19 LAB — POCT URINALYSIS DIP (DEVICE)
Bilirubin Urine: NEGATIVE
Glucose, UA: NEGATIVE mg/dL
Nitrite: NEGATIVE
Urobilinogen, UA: 0.2 mg/dL (ref 0.0–1.0)
pH: 7 (ref 5.0–8.0)

## 2010-09-19 LAB — LIPASE, BLOOD: Lipase: 26 U/L (ref 11–59)

## 2010-09-29 LAB — URINE CULTURE: Colony Count: NO GROWTH

## 2010-09-29 LAB — GC/CHLAMYDIA PROBE AMP, GENITAL
Chlamydia, DNA Probe: NEGATIVE
GC Probe Amp, Genital: NEGATIVE

## 2010-09-29 LAB — POCT URINALYSIS DIP (DEVICE)
Glucose, UA: 250 mg/dL — AB
Specific Gravity, Urine: 1.02 (ref 1.005–1.030)
Urobilinogen, UA: 4 mg/dL — ABNORMAL HIGH (ref 0.0–1.0)

## 2010-09-29 LAB — GLUCOSE, CAPILLARY: Glucose-Capillary: 114 mg/dL — ABNORMAL HIGH (ref 70–99)

## 2010-09-29 LAB — WET PREP, GENITAL
Clue Cells Wet Prep HPF POC: NONE SEEN
Yeast Wet Prep HPF POC: NONE SEEN

## 2010-09-29 LAB — POCT PREGNANCY, URINE: Preg Test, Ur: NEGATIVE

## 2010-10-08 ENCOUNTER — Ambulatory Visit (INDEPENDENT_AMBULATORY_CARE_PROVIDER_SITE_OTHER): Payer: Medicaid Other | Admitting: Family Medicine

## 2010-10-08 ENCOUNTER — Encounter: Payer: Self-pay | Admitting: Family Medicine

## 2010-10-08 DIAGNOSIS — K219 Gastro-esophageal reflux disease without esophagitis: Secondary | ICD-10-CM

## 2010-10-08 DIAGNOSIS — R51 Headache: Secondary | ICD-10-CM

## 2010-10-08 DIAGNOSIS — R519 Headache, unspecified: Secondary | ICD-10-CM | POA: Insufficient documentation

## 2010-10-08 DIAGNOSIS — M940 Chondrocostal junction syndrome [Tietze]: Secondary | ICD-10-CM

## 2010-10-08 DIAGNOSIS — Z309 Encounter for contraceptive management, unspecified: Secondary | ICD-10-CM

## 2010-10-08 MED ORDER — IBUPROFEN 600 MG PO TABS: 600.0000 mg | ORAL_TABLET | Freq: Four times a day (QID) | ORAL | Status: AC | PRN

## 2010-10-08 MED ORDER — NORTRIPTYLINE HCL 25 MG PO CAPS: 25.0000 mg | ORAL_CAPSULE | Freq: Every day | ORAL | Status: DC

## 2010-10-08 MED ORDER — OMEPRAZOLE 20 MG PO CPDR: 20.0000 mg | DELAYED_RELEASE_CAPSULE | Freq: Two times a day (BID) | ORAL | Status: DC

## 2010-10-08 MED ORDER — MEDROXYPROGESTERONE ACETATE 150 MG/ML IM SUSP
150.0000 mg | Freq: Once | INTRAMUSCULAR | Status: AC
  Administered 2010-10-08: 150 mg via INTRAMUSCULAR

## 2010-10-08 NOTE — Patient Instructions (Signed)
Costochondritis (Costochondral Separation / Tietze Syndrome) Costochondritis (Tietze syndrome), or costochondral separation, is a swelling and irritation (inflammation) of the tissue (cartilage) that connects your ribs with your breastbone (sternum). It may occur on its own (spontaneously), through damage caused by an accident (trauma), or simply from coughing or minor exercise. It may take up to 6 weeks to get better and longer if you are unable to be conservative in your activities. HOME CARE INSTRUCTIONS  Avoid exhausting physical activity. Try not to strain your ribs during normal activity. This would include any activities using chest, belly (abdominal) and side muscles, especially if heavy weights are used.   Use ice for 10 minutes per hour while awake for the first 2 days. Place the ice in a plastic bag, and place a towel between the bag of ice and your skin.   Only take over-the-counter or prescription medicines for pain, discomfort, or fever as directed by your caregiver.  SEEK IMMEDIATE MEDICAL CARE IF:  Your pain increases or you are very uncomfortable.   An oral temperature above 100.4 develops.   You develop difficulty with your breathing.   You cough up blood.   You develop worse chest pains, shortness of breath, sweating, or vomiting.   You develop new, unexplained problems (symptoms).  MAKE SURE YOU:   Understand these instructions.   Will watch your condition.   Will get help right away if you are not doing well or get worse.  Document Released: 03/10/2005 Document Re-Released: 08/25/2009 [facility] Patient Information 2011 [place], [place].

## 2010-10-12 ENCOUNTER — Encounter: Payer: Self-pay | Admitting: Family Medicine

## 2010-10-12 NOTE — Assessment & Plan Note (Signed)
NO red flags. Discussed Dx and Tx - including NDAIDs - take for 4 more days with gentle stretching. Precautions given.

## 2010-10-12 NOTE — Assessment & Plan Note (Signed)
Discussed sleep hygiene at length. Also, discussed tapering NSAIDs (after next few days) as they will cause rebound HA. Starting Nortriptyline today. Follow up in 3-4 weeks.

## 2010-10-12 NOTE — Progress Notes (Signed)
  Subjective:    Patient ID: Erin Osborn, female    DOB: 1980-08-26, 30 y.o.   MRN: [id number]  HPI  1. CP: x several days, anterior, mid-chest, described as ache, worse with and after cleaning, not associated with SOB, diaphoresis, or exertion. No comorbidities except obesity. FamHx DM and HTN. No early cardiac death.  2. HA: Almost daily x for months, sometimes bandlike, sometimes unilateral pounding. +/- N, photo/phonophobia. No focal neurological deficits. Not sleeping well - wakes nightly at 3 am and stays awake for one hour watching TV. Taking Motrin DAILY or BID.  Review of Systems SEE HPI.   Objective:   Physical Exam  Constitutional: She is oriented to person, place, and time. She appears well-developed and well-nourished. No distress.  Cardiovascular: Normal rate, regular rhythm, normal heart sounds and intact distal pulses.   Pulmonary/Chest: Effort normal and breath sounds normal.  Abdominal: Soft. Bowel sounds are normal.  Musculoskeletal:       TTP along left costochondral junction and with arm abduction in the same spot.  Neurological: She is alert and oriented to person, place, and time. She has normal reflexes. No cranial nerve deficit. She exhibits normal muscle tone. Coordination normal.  Psychiatric: She has a normal mood and affect.      Assessment & Plan:

## 2010-10-14 ENCOUNTER — Telehealth: Payer: Self-pay | Admitting: Family Medicine

## 2010-10-14 NOTE — Telephone Encounter (Signed)
Received call from pt about R eye swelling x 1 day. Pt reports feeling that something went in to her eye yesterday. Throughout the day today, pt with noted worsening eye redness and puffiness. No visual loss, HA, fever, N/V. Pt came home from work, took a nap, woke up with R eye crusting and difficulty opening. Eye now with yellowish drainage. No nuchal rigidity, decreased extraocular eye movement. Discussed with pt no current red flags of systemic disease/infection/neuro involvement.  Instructed pt to go to UC/ED for further evaluation.

## 2010-10-30 NOTE — Op Note (Signed)
[facility]  Patient:    Erin Osborn, Erin Osborn                    MRN: [id number] Proc. Date: 11/24/99 Adm. Date:  [id number] Disc. Date: [id number] Attending:  Bonnetta Barry CC:         Ward Givens, M.D.                           Operative Report  PREOPERATIVE DIAGNOSES:  Chronic cholelithiasis, cholelithiasis, rule out common bile duct stone.  POSTOPERATIVE DIAGNOSES:  Chronic cholecystitis, cholelithiasis.  PROCEDURE:  Laparoscopic cholecystectomy with intraoperative cholangiography.  SURGEON:  Velora Heckler, M.D.  ASSISTANT:  Anselm Pancoast. Zachery Dakins, M.D.  ANESTHESIA:  General.  ESTIMATED BLOOD LOSS:  Minimal.  PREPARATION:  Betadine.  COMPLICATIONS:  None.  INDICATIONS:  The patient is an 30-year-old black female referred by Dr. Ward Givens at [facility].  The patient was Erin Osborn in the emergency room on two occasions in late May and early June for abdominal  pain.  Laboratory studies were normal.  An abdominal ultrasound performed on November 16, 1999, at [facility] demonstrated cholelithiasis with tiny gallstones within the gallbladder.  The common bile duct was borderline dilated at 7.7 mm.  Of note, the patient is four weeks postpartum.  DESCRIPTION OF PROCEDURE:  The procedure is done in operating room #1 at the [facility].  The patient is brought to the operating room and placed in the supine position on the operating room table.  Following the administration of general anesthesia the patient is positioned and then prepped and draped in he usual strict aspect fashion.  After ascertaining an adequate level of anesthesia had been obtained, an infraumbilical incision is made in the midline with a #15  blade.  Dissection is carried through the subcutaneous tissues.  The fascia is incised in the midline.  The peritoneal cavity is entered cautiously.  A  #0 Vicryl pursestring suture is placed in the fascia.  A Hasson cannula is introduced under direct vision and secured with the pursestring suture.  The abdomen is insufflated with carbon dioxide.  Operative ports are placed along the right costal margin n the midline, midclavicular line, and anterior axillary line.  The abdomen is explored.  The gallbladder is moderately intrahepatic.  It is elevated.  The cystic duct and cystic artery can clearly be seen through the peritoneum as well as the common bile duct.  The cystic duct is dissected out along its length.  The peritoneum is incised in the triangle of Calot and the cystic artery is dissected out along its length.  The cystic artery is doubly clipped proximally and distally and divided.  A clip is placed at the junction of the cystic duct with the neck of the gallbladder.  The cystic duct is incised with the scissors.  A Cook catheter is introducer percutaneously into the peritoneal cavity, and inserted without difficulty into the cystic duct.  It is secured with a Ligaclip.  Using C-arm fluoroscopy, real time cholangiography is performed.  Three separate dye runs are recorded and reviewed by Dr. Blair Hailey. Manson Passey from radiology.  Although the biliary tree appears mildly dilated, there are no filling defects and no obstruction to flow.  Both the right and left ductal systems are filled.  There is free flow into the duodenum.  The [facility] catheter is withdrawn.  The cystic duct s triply clipped and divided.  The gallbladder is then excised from the gallbladder bed using the spatial electrocautery for hemostasis.  The gallbladder is completely excised and extracted through the umbilical port without difficulty.  Then #0 Vicryl pursestring suture is tied securely.  The right upper quadrant is inspected for hemostasis.  It is irrigated with warm saline, which is evacuated.  The ports are removed under direct vision with good  hemostasis noted.  The pneumoperitoneum is released.  Both midline surgical wounds are closed with interrupted #4-0 Vicryl subcuticular sutures.  All four wounds are washed and dried, and Benzoin and Steri-Strips are applied.  Sterile gauze dressings are applied.  The patient is  awakened from anesthesia and brought to the recovery room in stable condition. The patient tolerated the procedure well. DD:  11/24/99 TD:  11/27/99 Job: [id number]

## 2010-12-22 ENCOUNTER — Encounter: Payer: Self-pay | Admitting: Family Medicine

## 2010-12-22 ENCOUNTER — Ambulatory Visit (INDEPENDENT_AMBULATORY_CARE_PROVIDER_SITE_OTHER): Payer: Medicaid Other | Admitting: Family Medicine

## 2010-12-22 ENCOUNTER — Ambulatory Visit (HOSPITAL_COMMUNITY)
Admission: RE | Admit: 2010-12-22 | Discharge: 2010-12-22 | Disposition: A | Discharge status: Home / Self Care | Payer: Medicaid Other | Source: Ambulatory Visit | Attending: Family Medicine | Admitting: Family Medicine

## 2010-12-22 VITALS — BP 123/80 | HR 77 | Temp 97.7°F | Ht 62.6 in | Wt 206.0 lb

## 2010-12-22 DIAGNOSIS — M79672 Pain in left foot: Secondary | ICD-10-CM

## 2010-12-22 DIAGNOSIS — M79609 Pain in unspecified limb: Secondary | ICD-10-CM

## 2010-12-22 DIAGNOSIS — R609 Edema, unspecified: Secondary | ICD-10-CM | POA: Insufficient documentation

## 2010-12-22 DIAGNOSIS — K006 Disturbances in tooth eruption: Secondary | ICD-10-CM

## 2010-12-22 DIAGNOSIS — K007 Teething syndrome: Secondary | ICD-10-CM | POA: Insufficient documentation

## 2010-12-22 MED ORDER — TRAMADOL HCL 50 MG PO TABS: 50.0000 mg | ORAL_TABLET | Freq: Four times a day (QID) | ORAL | Status: AC | PRN

## 2010-12-22 NOTE — Patient Instructions (Signed)
I think that you have a wisdom tooth coming in Try orajel on the area as well as motrin or tramadol. Come back if you think the tooth is draining or the swelling is increased or you have a fever   For the foot, I would like you to go get an xray.   Go to the radiology dept for this at [facility] They can look up the order I will call you this PM or tomorrow to talk about that.

## 2010-12-24 ENCOUNTER — Telehealth: Payer: Self-pay | Admitting: Family Medicine

## 2010-12-24 NOTE — Telephone Encounter (Signed)
Called to give result of foot xray.  Left VM.  No broken bones in her foot so can wear any comfortable shoe

## 2010-12-24 NOTE — Assessment & Plan Note (Signed)
Foot pain and swelling after dropping a vase on foot.  No bruising, walking on it.  Unlikely broken but will check.  Advised wear supportive shoe, xray of foot.

## 2010-12-24 NOTE — Progress Notes (Signed)
  Subjective:    Patient ID: Erin Osborn, female    DOB: Nov 10, 1980, 30 y.o.   MRN: [id number]  HPI Pt dropped vase on top of foot 3 days previous.  Has been walking on it but still in pain.   Some swelling but no bruising.  Able to walk on it immediately.  Having trouble with pain while at work   Tooth ache- x2 days.  No fevers, no drainage.  Pain but able to eat   Review of Systems See above    Objective:   Physical Exam     Vital signs reviewed General appearance - alert, well appearing, and in no distress and oriented to person, place, and time MSK-left foot with minor swelling.  Good ROM.  Good dp and PT pulses.  No ecchymosis.  TTP over mid foot and forefoot   Assessment & Plan:

## 2010-12-24 NOTE — Assessment & Plan Note (Signed)
Wisdom tooth eruption.  Advised salt water gargles and tramadol.  Pt should call dentist to see about payment plan for extraction if needed.

## 2011-01-05 ENCOUNTER — Ambulatory Visit (INDEPENDENT_AMBULATORY_CARE_PROVIDER_SITE_OTHER): Payer: Medicaid Other | Admitting: *Deleted

## 2011-01-05 DIAGNOSIS — Z3049 Encounter for surveillance of other contraceptives: Secondary | ICD-10-CM

## 2011-01-05 DIAGNOSIS — Z309 Encounter for contraceptive management, unspecified: Secondary | ICD-10-CM

## 2011-01-05 MED ORDER — MEDROXYPROGESTERONE ACETATE 150 MG/ML IM SUSP
150.0000 mg | Freq: Once | INTRAMUSCULAR | Status: AC
  Administered 2011-01-05: 150 mg via INTRAMUSCULAR

## 2011-03-09 LAB — URINALYSIS, ROUTINE W REFLEX MICROSCOPIC
Ketones, ur: >80 — AB
Nitrite: NEGATIVE
Protein, ur: NEGATIVE
pH: 6.5

## 2011-03-11 LAB — URINALYSIS, ROUTINE W REFLEX MICROSCOPIC
Bilirubin Urine: NEGATIVE
Ketones, ur: NEGATIVE
Nitrite: NEGATIVE
Specific Gravity, Urine: 1.01
Urobilinogen, UA: 0.2

## 2011-03-15 LAB — CBC
HCT: 35.1 — ABNORMAL LOW
Hemoglobin: 11.6 — ABNORMAL LOW
MCHC: 32.9
MCHC: 33.4
MCV: 91.7
Platelets: 192
RDW: 14.4

## 2011-03-22 LAB — POCT URINALYSIS DIP (DEVICE)
Operator id: 235561
Protein, ur: NEGATIVE
Urobilinogen, UA: 0.2

## 2011-03-22 LAB — POCT PREGNANCY, URINE: Preg Test, Ur: NEGATIVE

## 2011-03-23 ENCOUNTER — Telehealth: Payer: Self-pay | Admitting: Family Medicine

## 2011-03-23 NOTE — Telephone Encounter (Signed)
Spoke with patient and advised since she has had chicken pox she  will not contract the virus from someone with shingles. explained to her how shingles  occurs.

## 2011-03-23 NOTE — Telephone Encounter (Signed)
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  She has had the Chicken Pox, but is concerned about some symptoms she is having.  She would like to speak to a nurse.

## 2011-03-30 ENCOUNTER — Ambulatory Visit (INDEPENDENT_AMBULATORY_CARE_PROVIDER_SITE_OTHER): Payer: Medicaid Other | Admitting: Family Medicine

## 2011-03-30 ENCOUNTER — Encounter: Payer: Self-pay | Admitting: Family Medicine

## 2011-03-30 VITALS — BP 118/80 | Temp 98.2°F | Wt 205.5 lb

## 2011-03-30 DIAGNOSIS — H10509 Unspecified blepharoconjunctivitis, unspecified eye: Secondary | ICD-10-CM | POA: Insufficient documentation

## 2011-03-30 DIAGNOSIS — Z309 Encounter for contraceptive management, unspecified: Secondary | ICD-10-CM

## 2011-03-30 DIAGNOSIS — H109 Unspecified conjunctivitis: Secondary | ICD-10-CM

## 2011-03-30 MED ORDER — MEDROXYPROGESTERONE ACETATE 150 MG/ML IM SUSP
150.0000 mg | Freq: Once | INTRAMUSCULAR | Status: AC
  Administered 2011-03-30: 150 mg via INTRAMUSCULAR

## 2011-03-30 NOTE — Patient Instructions (Addendum)
It does not look like there is a scratch on your eye. It could be bacterial conjunctivitis (pink eye).  Since you already have the clindamycin eye drops, go ahead and use 1 drop 1x/day for the next 5 days. Make sure you clean the tip of the dropper off very well before using it so that you don't re-infect your eye with a previous bacteria.  You also got your depo today.  Please come back if you start having eye pain or tenderness, itching, pain with eye movement, blurry vision, fever or chills.

## 2011-03-30 NOTE — Assessment & Plan Note (Signed)
Unilateral so less likely to be viral, could be bacterial, pt already w/ clinda eye drops so will have her use these in case it is a bacterial infection. No obvious foreign body or scratch. NO vision changes, pain, or other red flags.

## 2011-03-30 NOTE — Progress Notes (Signed)
S: Pt comes in today for depo shot and ?conjunctivitis.  Noticed redness in her left eye yesterday. No pain or itching, no burning, no foreign body sensation, no pain with movement or light, no vision changes or blurry vision.  Does not think she has been rubbing her eyes or got anything in it recently.  Did wake up w/ some "green goop" that she had to wipe off when she got up this morning. No runny nose, cough, or congestion recently.  No fever or chills. No N/V/D. 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 Does not wear contacts.    ROS: Per HPI  History  Smoking status  . Never Smoker   Smokeless tobacco  . Never Used    O:  Filed Vitals:   03/30/11 1022  BP: 118/80  Temp: 98.2 F (36.8 C)    Gen: NAD HEENT: MMM, no pharyngeal erythema or exudate, right eye normal, left eye with scleral erythema, EOMI, PERRLA, fluorescin/black light test negative- no obvious scratches    A/P: 30 y.o. female p/w scleral erythema -See problem list -f/u PRN

## 2011-06-02 ENCOUNTER — Ambulatory Visit: Payer: Self-pay

## 2011-06-02 ENCOUNTER — Other Ambulatory Visit: Payer: Self-pay | Admitting: Occupational Medicine

## 2011-06-02 DIAGNOSIS — R52 Pain, unspecified: Secondary | ICD-10-CM

## 2011-06-09 ENCOUNTER — Ambulatory Visit (HOSPITAL_COMMUNITY): Payer: Medicaid Other

## 2011-06-09 ENCOUNTER — Ambulatory Visit (HOSPITAL_COMMUNITY)
Admission: RE | Admit: 2011-06-09 | Discharge: 2011-06-09 | Disposition: A | Discharge status: Home / Self Care | Payer: Medicaid Other | Source: Ambulatory Visit | Attending: Family Medicine | Admitting: Family Medicine

## 2011-06-09 ENCOUNTER — Other Ambulatory Visit (HOSPITAL_COMMUNITY): Payer: Self-pay | Admitting: Family Medicine

## 2011-06-09 DIAGNOSIS — M79609 Pain in unspecified limb: Secondary | ICD-10-CM | POA: Insufficient documentation

## 2011-06-09 DIAGNOSIS — R52 Pain, unspecified: Secondary | ICD-10-CM

## 2011-06-09 NOTE — Progress Notes (Signed)
*  PRELIMINARY RESULTS*  Left Lower Extremity Venous has been performed. Left:  No evidence of DVT or Baker's cyst.   Farrel Demark RDMS 06/09/2011, 4:42 PM

## 2011-06-17 ENCOUNTER — Encounter (HOSPITAL_COMMUNITY): Payer: Medicaid Other

## 2011-06-21 ENCOUNTER — Ambulatory Visit (INDEPENDENT_AMBULATORY_CARE_PROVIDER_SITE_OTHER): Payer: Medicaid Other | Admitting: *Deleted

## 2011-06-21 DIAGNOSIS — Z309 Encounter for contraceptive management, unspecified: Secondary | ICD-10-CM

## 2011-06-21 MED ORDER — MEDROXYPROGESTERONE ACETATE 150 MG/ML IM SUSP
150.0000 mg | Freq: Once | INTRAMUSCULAR | Status: AC
  Administered 2011-06-21: 150 mg via INTRAMUSCULAR

## 2011-06-22 NOTE — Progress Notes (Signed)
Patient notified of need for CPE and pap. Appointment scheduled for 07/02/2011 with PCP.

## 2011-07-01 ENCOUNTER — Ambulatory Visit: Payer: Self-pay

## 2011-07-02 ENCOUNTER — Ambulatory Visit: Payer: Self-pay | Admitting: Family Medicine

## 2011-08-04 ENCOUNTER — Encounter: Payer: Self-pay | Admitting: Family Medicine

## 2011-08-04 ENCOUNTER — Ambulatory Visit: Payer: Self-pay | Admitting: Family Medicine

## 2011-08-04 ENCOUNTER — Ambulatory Visit (INDEPENDENT_AMBULATORY_CARE_PROVIDER_SITE_OTHER): Payer: Self-pay | Admitting: Family Medicine

## 2011-08-04 VITALS — BP 122/82 | HR 84 | Temp 98.8°F | Ht 62.0 in | Wt 208.0 lb

## 2011-08-04 DIAGNOSIS — K219 Gastro-esophageal reflux disease without esophagitis: Secondary | ICD-10-CM

## 2011-08-04 MED ORDER — BENZONATATE 100 MG PO CAPS: 100.0000 mg | ORAL_CAPSULE | Freq: Two times a day (BID) | ORAL | Status: AC | PRN

## 2011-08-04 MED ORDER — OMEPRAZOLE 20 MG PO CPDR: 20.0000 mg | DELAYED_RELEASE_CAPSULE | Freq: Two times a day (BID) | ORAL | Status: DC

## 2011-08-04 NOTE — Patient Instructions (Signed)
If you develop fever, chills, vomiting or worsening cough, please return to clinic. Take Omeprazole and Tessalon perles as directed. Schedule follow up appointment with Dr. Denyse Amass in 2 weeks.  Upper Respiratory Infection, Adult An upper respiratory infection (URI) is also known as the common cold. It is often caused by a type of germ (virus). Colds are easily spread (contagious). You can pass it to others by kissing, coughing, sneezing, or drinking out of the same glass. Usually, you get better in 1 or 2 weeks.  HOME CARE   Only take medicine as told by your doctor.   Use a warm mist humidifier or breathe in steam from a hot shower.   Drink enough water and fluids to keep your pee (urine) clear or pale yellow.   Get plenty of rest.   Return to work when your temperature is back to normal or as told by your doctor. You may use a face mask and wash your hands to stop your cold from spreading.  GET HELP RIGHT AWAY IF:   After the first few days, you feel you are getting worse.   You have questions about your medicine.   You have chills, shortness of breath, or brown or red spit (mucus).   You have yellow or brown snot (nasal discharge) or pain in the face, especially when you bend forward.   You have a fever, puffy (swollen) neck, pain when you swallow, or white spots in the back of your throat.   You have a bad headache, ear pain, sinus pain, or chest pain.   You have a high-pitched whistling sound when you breathe in and out (wheezing).   You have a lasting cough or cough up blood.   You have sore muscles or a stiff neck.  MAKE SURE YOU:   Understand these instructions.   Will watch your condition.   Will get help right away if you are not doing well or get worse.  Document Released: 11/17/2007 Document Revised: 02/10/2011 Document Reviewed: 10/05/2010 [facility] Patient Information 2012 [place], [place].

## 2011-08-04 NOTE — Progress Notes (Signed)
  Subjective:     Erin Osborn is a 31 y.o. female who presents for evaluation of symptoms of a URI. Symptoms include congestion and cough described as nonproductive and nocturnal.  She also complains of rhinorrhea.  Patient does not smoke cigarettes.  She denies any CP, SOB, fever, chills, NS, N/V.  Patient denies any acid reflux, indigestion, or abdominal pain.  Onset of symptoms was 2 weeks ago, and has been unchanged since that time. Treatment to date: she has tried Mucinex DM which has not relieved cough.  Patient was taking Omeprazole at one time, but has stopped.  She cannot remember why she was on that medication.  The following portions of the patient's history were reviewed and updated as appropriate: allergies, current medications and past social history.  Review of Systems Pertinent items are noted in HPI.   Objective:    BP 122/82  Pulse 84  Temp(Src) 98.8 F (37.1 C) (Oral)  Ht 5\' 2"  (1.575 m)  Wt 208 lb (94.348 kg)  BMI 38.04 kg/m2  General Appearance:    Alert, cooperative, no distress, appears stated age  Head:    Normocephalic, without obvious abnormality, atraumatic  Eyes:    PERRL, conjunctiva/corneas clear, EOM's intact, fundi    benign, both eyes  Ears:    Normal TM's and external ear canals, both ears  Nose:   Nares normal, septum midline, mucosa normal, no drainage    or sinus tenderness  Throat:   Lips, mucosa, and tongue normal; teeth and gums normal  Neck:   Supple, symmetrical, trachea midline, no adenopathy;    thyroid:  no enlargement/tenderness/nodules; no carotid   bruit or JVD  Back:     Symmetric, no curvature, ROM normal, no CVA tenderness  Lungs:     Clear to auscultation bilaterally, respirations unlabored  Chest Wall:    No tenderness or deformity   Heart:    Regular rate and rhythm, S1 and S2 normal, no murmur, rub   or gallop  Breast Exam:    No tenderness, masses, or nipple abnormality  Abdomen:     Soft, non-tender, bowel sounds active  all four quadrants,    no masses, no organomegaly  Genitalia:    Normal female without lesion, discharge or tenderness  Rectal:    Normal tone, normal prostate, no masses or tenderness;   guaiac negative stool  Extremities:   Extremities normal, atraumatic, no cyanosis or edema  Pulses:   2+ and symmetric all extremities  Skin:   Skin color, texture, turgor normal, no rashes or lesions  Lymph nodes:   Cervical, supraclavicular, and axillary nodes normal  Neurologic:   CNII-XII intact, normal strength, sensation and reflexes    throughout     Assessment:    viral upper respiratory illness   Plan:     Discussed diagnosis and treatment of URI. Discussed the importance of avoiding unnecessary antibiotic therapy. Sent Rx for [employer] as needed for cough. Also, refilled PPI in case this is secondary to GERD. Nasal saline spray for congestion. Follow up as needed. Call in 7 days if symptoms aren't resolving. Follow up with PCP in 2 weeks or as needed.

## 2011-08-05 ENCOUNTER — Encounter: Payer: Self-pay | Admitting: Family Medicine

## 2011-08-05 ENCOUNTER — Ambulatory Visit (INDEPENDENT_AMBULATORY_CARE_PROVIDER_SITE_OTHER): Payer: Self-pay | Admitting: Family Medicine

## 2011-08-05 VITALS — BP 136/82 | HR 80 | Temp 98.3°F | Ht 62.0 in | Wt 209.0 lb

## 2011-08-05 DIAGNOSIS — H109 Unspecified conjunctivitis: Secondary | ICD-10-CM

## 2011-08-05 MED ORDER — POLYMYXIN B-TRIMETHOPRIM 10000-0.1 UNIT/ML-% OP SOLN: 1.0000 [drp] | OPHTHALMIC | Status: AC

## 2011-08-05 NOTE — Progress Notes (Signed)
  Subjective:    Patient ID: Erin Osborn, female    DOB: 08-07-1980, 31 y.o.   MRN: [id number]  HPI Evaluated and examined patient with MS3.  Agree with note as written  HPI:  Left pinkeye in setting of uri.  Itchy, crusty.  No other sick contacts.  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  NO pain, no vision change.   Review of Systemssee above     Objective:   Physical Exam GEN: Alert & Oriented, No acute distress EYE: EOMI, PERRLA.  Injected left conjunctiva. CV:  Regular Rate & Rhythm, no murmur Respiratory:  Normal work of breathing, CTAB         Assessment & Plan:

## 2011-08-05 NOTE — Progress Notes (Signed)
Erin Osborn is a 31 y.o. female with no significant PMHx who presents to [facility] today for eye pain and redness.  Pt reports that her right eye started to hurt yesterday evening but was not red until this morning.  Today when she woke up, her right eye was closed shut with crust and was very red and uncomfortable.  She describes the feeling in her eye as itchy and scratchy.  Denies vision changes, n/v/d, fever, chills, rash.  Denies HA, numbness, tingling.  Pt was seen yesterday here with URI.  Pt does not wear contacts.  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   PMH reviewed.  ROS as above otherwise neg Medications reviewed. Current Outpatient Prescriptions  Medication Sig Dispense Refill  . benzonatate (TESSALON) 100 MG capsule Take 1 capsule (100 mg total) by mouth 2 (two) times daily as needed for cough.  40 capsule  0  . medroxyPROGESTERone (DEPO-PROVERA) 150 MG/ML injection Inject 150 mg into the muscle every 3 (three) months. Per protocol.       . nortriptyline (PAMELOR) 25 MG capsule Take 1 capsule (25 mg total) by mouth at bedtime.  30 capsule  2  . omeprazole (PRILOSEC) 20 MG capsule Take 1 capsule (20 mg total) by mouth 2 (two) times daily.  60 capsule  1  . traMADol (ULTRAM) 50 MG tablet Take 1 tablet (50 mg total) by mouth every 6 (six) hours as needed for pain.  60 tablet  0  . trimethoprim-polymyxin b (POLYTRIM) ophthalmic solution Place 1 drop into the right eye every 4 (four) hours.  10 mL  0    Exam:  BP 136/82  Pulse 80  Temp(Src) 98.3 F (36.8 C) (Oral)  Ht 5\' 2"  (1.575 m)  Wt 209 lb (94.802 kg)  BMI 38.23 kg/m2 Gen: Well NAD HEENT: EOMI,  MMM Lungs: CTABL, no w/r/r Heart: RRR no MRG Eye: PERRL, no pain with ocular movements, right conjunctiva is diffusely injected, minimal crusting around medial aspect of right eye, no blepharitis or edema surrounding eye; fundoscopic: no papilledema, red reflexes intact   Assessment and Plan: Ms. Weilbacher is a 31 yo  F with no significant PMHx who presents to PCP with eye redness and pain.  History and physical findings are consistent with conjunctivitis (most likely viral).  Due to patient's current viral URI, suspect that the same virus could be causing her conjunctivitis.  Because bacterial and viral conjunctivitis are quite similar in presentation and disease course, management is quite similar.  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  1. Polytrim drops to affected eye  2. May return to work tomorrow  Gerlene Fee, MS3

## 2011-08-05 NOTE — Patient Instructions (Signed)
polytrim drops in right eye sent to your pharmacy  Conjunctivitis Conjunctivitis is commonly called "pink eye." Conjunctivitis can be caused by bacterial or viral infection, allergies, or injuries. There is usually redness of the lining of the eye, itching, discomfort, and sometimes discharge. There may be deposits of matter along the eyelids. A viral infection usually causes a watery discharge, while a bacterial infection causes a yellowish, thick discharge. Pink eye is very contagious and spreads by direct contact. You may be given antibiotic eyedrops as part of your treatment. Before using your eye medicine, remove all drainage from the eye by washing gently with warm water and cotton balls. Continue to use the medication until you have awakened 2 mornings in a row without discharge from the eye. Do not rub your eye. This increases the irritation and helps spread infection. Use separate towels from other household members. Wash your hands with soap and water before and after touching your eyes. Use cold compresses to reduce pain and sunglasses to relieve irritation from light. Do not wear contact lenses or wear eye makeup until the infection is gone. SEEK MEDICAL CARE IF:    Your symptoms are not better after 3 days of treatment.     You have increased pain or trouble seeing.     The outer eyelids become very red or swollen.  Document Released: 07/08/2004 Document Revised: 02/10/2011 Document Reviewed: 05/31/2005 [facility] Patient Information 2012 [place], [place].

## 2011-08-05 NOTE — Assessment & Plan Note (Signed)
Conjunctivitis, likely viral given her concurrent viral uri.  Needs treatment in order to return to work.  Will treat empirically with polytrim.  Discussed precautions and proper hygiene to prevent spreading infection.  May return to work tomorrow.

## 2011-08-22 ENCOUNTER — Encounter (HOSPITAL_COMMUNITY): Payer: Self-pay

## 2011-08-22 ENCOUNTER — Emergency Department (INDEPENDENT_AMBULATORY_CARE_PROVIDER_SITE_OTHER)
Admission: EM | Admit: 2011-08-22 | Discharge: 2011-08-22 | Disposition: A | Discharge status: Home / Self Care | Payer: Self-pay | Source: Home / Self Care | Attending: Emergency Medicine | Admitting: Emergency Medicine

## 2011-08-22 ENCOUNTER — Emergency Department (INDEPENDENT_AMBULATORY_CARE_PROVIDER_SITE_OTHER): Payer: Self-pay

## 2011-08-22 DIAGNOSIS — M84374A Stress fracture, right foot, initial encounter for fracture: Secondary | ICD-10-CM

## 2011-08-22 DIAGNOSIS — M8430XA Stress fracture, unspecified site, initial encounter for fracture: Secondary | ICD-10-CM

## 2011-08-22 DIAGNOSIS — M79609 Pain in unspecified limb: Secondary | ICD-10-CM

## 2011-08-22 MED ORDER — DICLOFENAC SODIUM 75 MG PO TBEC: 75.0000 mg | DELAYED_RELEASE_TABLET | Freq: Two times a day (BID) | ORAL | Status: DC

## 2011-08-22 MED ORDER — TRAMADOL HCL 50 MG PO TABS: 100.0000 mg | ORAL_TABLET | Freq: Three times a day (TID) | ORAL | Status: AC | PRN

## 2011-08-22 NOTE — ED Provider Notes (Signed)
Chief Complaint  Patient presents with  . Foot Pain    History of Present Illness:   Erin Osborn is a 31 year old female who has had a two-day history of right foot pain. She denies any history of injury, although she states she is on her feet continuously during the day. The pain is worse when she's on her feet but doesn't go completely away when she lies down. There is no swelling, redness, or heat. She's never had anything like this before. She denies any history of arthritis or gout. No family history of gout. Her only medication right now is Depo-Provera.  Review of Systems:  Other than noted above, the patient denies any of the following symptoms: Systemic:  No fevers, chills, sweats, or aches.  No fatigue or tiredness. Musculoskeletal:  No joint pain, arthritis, bursitis, swelling, back pain, or neck pain. Neurological:  No muscular weakness, paresthesias, headache, or trouble with speech or coordination.  No dizziness.   PMFSH:  Past medical history, family history, social history, meds, and allergies were reviewed.  Physical Exam:   Vital signs:  BP 118/81  Pulse 77  Temp(Src) 98.7 F (37.1 C) (Oral)  Resp 16  SpO2 99% Gen:  Alert and oriented times 3.  In no distress. Musculoskeletal: There is diffuse pain of the entire dorsum of the foot from the knees to the ankle joint. There is no pain to palpation over the toes, the medial or lateral malleoli, the calcaneus, leg, or plantar surface of the foot. There is no swelling, redness, or heat. The ankle joint has a full range of motion but it is painful. Otherwise, all joints had a full a ROM with no swelling, bruising or deformity.  No edema, pulses full. Extremities were warm and pink.  Capillary refill was brisk.  Skin:  Clear, warm and dry.  No rash. Neuro:  Alert and oriented times 3.  Muscle strength was normal.  Sensation was intact to light touch.   Radiology:  Dg Foot Complete Right  08/22/2011  *RADIOLOGY REPORT*  Clinical  Data: Right-sided foot pain for past 2 days.  No history of injury.  RIGHT FOOT COMPLETE - 3+ VIEW  Comparison: Right foot radiograph dated 06/06/2009  Findings: Three views of the right foot demonstrate no acute fracture, subluxation, dislocation, joint or soft tissue abnormality.  IMPRESSION: 1.  No acute radiographic abnormality of the right foot to account for the patient's symptoms.  Original Report Authenticated By: Florencia Reasons, M.D.   Medical Decision Making:  Diagnostic considerations include stress fracture, arthritis, tendinitis, or gout. Gout would be unusual given her age and also the fact that she's not taking any medications that might cause gout. I did obtain a uric acid level and is still pending at the time of this dictation. I suggested she followup with Dr. Denyse Amass [facility]. In the meantime she was given top shoe and crutches and should avoid weightbearing on the foot.  Assessment:   Diagnoses that have been ruled out:  None  Diagnoses that are still under consideration:  None  Final diagnoses:  Stress fracture of right foot    Plan:   1.  The following meds were prescribed:   New Prescriptions   DICLOFENAC (VOLTAREN) 75 MG EC TABLET    Take 1 tablet (75 mg total) by mouth 2 (two) times daily.   TRAMADOL (ULTRAM) 50 MG TABLET    Take 2 tablets (100 mg total) by mouth every 8 (  eight) hours as needed for pain.   2.  The patient was instructed in symptomatic care, including rest and activity, elevation, application of ice and compression.  Appropriate handouts were given. 3.  The patient was told to return if becoming worse in any way, if no better in 3 or 4 days, and given some red flag symptoms that would indicate earlier return.   4.  The patient was told to follow up next week with Dr. Denyse Amass.   Reuben Likes, MD 08/22/11 [phone]

## 2011-08-22 NOTE — ED Notes (Signed)
Pt has had rt foot pain for 3 days, no known injury.

## 2011-08-22 NOTE — Discharge Instructions (Signed)
Stress Fracture       When too much stress is put on the foot, as in running and jumping sports, the center shaft of the bones of the forefoot is very susceptible to stress fractures (break in bones) because of thinness of this bone. This injury is more common if osteoporosis is present or if inadequate running shoes are used. Shoes should be used which adequately cushion the foot to absorb the shocks of the activity participated in. Stress fractures are very common in competitive female runners who develop these small cracks on the surface of the bones in their legs and feet. The women most likely to suffer these injuries are those who restrict food and those who have irregular periods.   Stress fractures usually start out as a minor discomfort in the foot or leg. The fracture often occurs near the end of a long run. Usually the pain goes away with rest. On the next day, the pain returns earlier in the run. If an athlete notices that it hurts to touch just one spot on a bone and then stops running for a week, they can return to running quickly. But usually the pain is ignored and a stress fracture develops. The athlete now has to avoid the hard pounding of running, but can ride a bike or swim for exercise until the fracture heals in 6 to 12 weeks. The most common sites for stress fractures are the bones in the front of the feet and the long bone of the lower leg, but running can cause stress fractures anywhere, even in the pelvic bones.   DIAGNOSIS   Usually the diagnosis is made by history. The bone involved progressively becomes sorer with activities. X-rays may be negative (show no break) within the first two to three weeks of the beginning of pain. A later x-ray may show signs of healing bone (callus formation). A bone scan will usually make the diagnosis earlier.   HOME CARE INSTRUCTIONS   Treatment may or may not include a cast or walking shoe. When casts are needed the use is usually for short periods of  time so as not to slow down healing with muscle wasting (atrophy).   Activities should be stopped until further advised by your caregiver.   Wear shoes with adequate shock absorbing abilities.   Alternative exercise may be undertaken while waiting for healing. These may include bicycling and swimming, or as your caregiver suggests.  If you do not have a cast or splint:   You may walk on your injured foot as tolerated or advised.   Do not put any weight on your injured foot until instructed. Slowly increase the amount of time you walk on the foot as the pain allows or as advised.   Use crutches until you can bear weight without pain. A gradual increase in weight bearing may help.   Apply ice to the injury for 15 to 20 minutes each hour while awake for the first 2 days. Put the ice in a plastic bag and place a towel between the bag of ice and your skin.   Only take over-the-counter or prescription medicines for pain, discomfort, or fever as directed by your caregiver.   If your caregiver has given you a follow-up appointment, it is very important to keep that appointment. Not keeping the appointment could result in a chronic or permanent injury, pain, and disability. If there is any problem keeping the appointment, you must call back to this facility for   assistance.  SEEK IMMEDIATE MEDICAL CARE IF:   Pain is becoming worse rather than better, or if pain is uncontrolled with medicine.   You have increased swelling or redness in the foot.  Document Released: 08/21/2002 Document Revised: 05/20/2011 Document Reviewed: 01/15/2008   [employer]® Patient Information ©2012 [employer].

## 2011-09-06 ENCOUNTER — Ambulatory Visit (INDEPENDENT_AMBULATORY_CARE_PROVIDER_SITE_OTHER): Payer: Self-pay | Admitting: *Deleted

## 2011-09-06 DIAGNOSIS — Z309 Encounter for contraceptive management, unspecified: Secondary | ICD-10-CM

## 2011-09-06 MED ORDER — MEDROXYPROGESTERONE ACETATE 150 MG/ML IM SUSP
150.0000 mg | Freq: Once | INTRAMUSCULAR | Status: AC
  Administered 2011-09-06: 150 mg via INTRAMUSCULAR

## 2011-09-06 MED ORDER — MEDROXYPROGESTERONE ACETATE 150 MG/ML IM SUSP: 150.0000 mg | INTRAMUSCULAR | Status: DC

## 2011-11-22 ENCOUNTER — Ambulatory Visit: Payer: Self-pay

## 2011-12-02 ENCOUNTER — Ambulatory Visit: Payer: Self-pay

## 2011-12-06 ENCOUNTER — Ambulatory Visit (INDEPENDENT_AMBULATORY_CARE_PROVIDER_SITE_OTHER): Payer: Medicaid Other | Admitting: *Deleted

## 2011-12-06 DIAGNOSIS — Z309 Encounter for contraceptive management, unspecified: Secondary | ICD-10-CM

## 2011-12-06 MED ORDER — MEDROXYPROGESTERONE ACETATE 150 MG/ML IM SUSP
150.0000 mg | Freq: Once | INTRAMUSCULAR | Status: AC
  Administered 2011-12-06: 150 mg via INTRAMUSCULAR

## 2011-12-22 ENCOUNTER — Ambulatory Visit (INDEPENDENT_AMBULATORY_CARE_PROVIDER_SITE_OTHER): Payer: Self-pay | Admitting: Family Medicine

## 2011-12-22 ENCOUNTER — Encounter: Payer: Self-pay | Admitting: Family Medicine

## 2011-12-22 VITALS — BP 125/83 | HR 94 | Temp 98.7°F | Ht 62.0 in | Wt 201.5 lb

## 2011-12-22 DIAGNOSIS — J329 Chronic sinusitis, unspecified: Secondary | ICD-10-CM

## 2011-12-22 MED ORDER — AMOXICILLIN 500 MG PO CAPS: 500.0000 mg | ORAL_CAPSULE | Freq: Two times a day (BID) | ORAL | Status: AC

## 2011-12-22 NOTE — Patient Instructions (Addendum)
Try some claritin or zyrtec (buy store brand) for your sneezing and itching  Sinusitis Sinusitis an infection of the air pockets (sinuses) in your face. This can cause puffiness (swelling). It can also cause drainage from your sinuses.   HOME CARE    Only take medicine as told by your doctor.   Drink enough fluids to keep your pee (urine) clear or pale yellow.   Apply moist heat or ice packs for pain relief.   Use salt (saline) nose sprays. The spray will wet the thick fluid in the nose. This can help the sinuses drain.  GET HELP RIGHT AWAY IF:    You have a fever.   Your baby is older than 3 months with a rectal temperature of 102 F (38.9 C) or higher.   Your baby is 10 months old or younger with a rectal temperature of 100.4 F (38 C) or higher.   The pain gets worse.   You get a very bad headache.   You keep throwing up (vomiting).   Your face gets puffy.  MAKE SURE YOU:    Understand these instructions.   Will watch your condition.   Will get help right away if you are not doing well or get worse.  Document Released: 11/17/2007 Document Revised: 05/20/2011 Document Reviewed: 11/17/2007 [facility] Patient Information 2012 [place], [place].

## 2011-12-22 NOTE — Progress Notes (Signed)
  Subjective:    Patient ID: Erin Osborn, female    DOB: 1980/09/01, 31 y.o.   MRN: [id number]  HPI 2 week history of nasal congestion, rhinorrhea, postnasal drainage, cough.  States was improving, then got worse.  Some itchy nose and sneezing.  No fever, headache, chills.  I have reviewed patient's  PMH, FH, and Social history and Medications as related to this visit. Non smoker Review of Systems    see HPI Objective:   Physical Exam GEN: Alert & Oriented, No acute distress HEENT: [place]/AT. EOMI, PERRLA, no conjunctival injection or scleral icterus.  Bilateral tympanic membranes intact without erythema or effusion.  .  Right nasal turbinate edematous.  Oropharynx is without erythema or exudates.  No anterior or posterior cervical lymphadenopathy. CV:  Regular Rate & Rhythm, no murmur Respiratory:  Normal work of breathing, CTAB Abd:  + BS, soft, no tenderness to palpation Ext: no pre-tibial edema        Assessment & Plan:

## 2011-12-22 NOTE — Assessment & Plan Note (Signed)
Given second sickening, symptoms > 2 weeks, will treat with amox for acute sinusitis.  Also advised OTC allergy medication for symptom of sneezing.

## 2012-03-02 ENCOUNTER — Ambulatory Visit (INDEPENDENT_AMBULATORY_CARE_PROVIDER_SITE_OTHER): Payer: Medicaid Other | Admitting: *Deleted

## 2012-03-02 DIAGNOSIS — Z309 Encounter for contraceptive management, unspecified: Secondary | ICD-10-CM

## 2012-03-02 DIAGNOSIS — Z23 Encounter for immunization: Secondary | ICD-10-CM

## 2012-03-02 MED ORDER — MEDROXYPROGESTERONE ACETATE 150 MG/ML IM SUSP
150.0000 mg | Freq: Once | INTRAMUSCULAR | Status: AC
  Administered 2012-03-02: 150 mg via INTRAMUSCULAR

## 2012-03-02 NOTE — Progress Notes (Signed)
Appointment scheduled for CPE 03/30/2012

## 2012-03-30 ENCOUNTER — Encounter: Payer: Self-pay | Admitting: Family Medicine

## 2012-05-25 ENCOUNTER — Ambulatory Visit (INDEPENDENT_AMBULATORY_CARE_PROVIDER_SITE_OTHER): Payer: Medicaid Other | Admitting: *Deleted

## 2012-05-25 DIAGNOSIS — Z309 Encounter for contraceptive management, unspecified: Secondary | ICD-10-CM

## 2012-05-25 MED ORDER — MEDROXYPROGESTERONE ACETATE 150 MG/ML IM SUSP
150.0000 mg | Freq: Once | INTRAMUSCULAR | Status: AC
  Administered 2012-05-25: 150 mg via INTRAMUSCULAR

## 2012-05-25 NOTE — Progress Notes (Signed)
Next Depo due Feb 23 through  August 24, 2012. Reminder card given.  Advised patient she needs CPE.  She states she was 15 minutes late for her appointment that was scheduled in Oct and could not be seen that day. Has not rescheduled. Recheduled appointment for 06/22/2012.

## 2012-06-22 ENCOUNTER — Ambulatory Visit (INDEPENDENT_AMBULATORY_CARE_PROVIDER_SITE_OTHER): Payer: Medicaid Other | Admitting: Family Medicine

## 2012-06-22 ENCOUNTER — Encounter: Payer: Self-pay | Admitting: Family Medicine

## 2012-06-22 VITALS — BP 135/87 | HR 72 | Ht 61.25 in | Wt 203.0 lb

## 2012-06-22 DIAGNOSIS — Z01419 Encounter for gynecological examination (general) (routine) without abnormal findings: Secondary | ICD-10-CM

## 2012-06-22 DIAGNOSIS — Z0000 Encounter for general adult medical examination without abnormal findings: Secondary | ICD-10-CM

## 2012-06-22 DIAGNOSIS — Z1151 Encounter for screening for human papillomavirus (HPV): Secondary | ICD-10-CM | POA: Insufficient documentation

## 2012-06-22 DIAGNOSIS — K219 Gastro-esophageal reflux disease without esophagitis: Secondary | ICD-10-CM

## 2012-06-22 DIAGNOSIS — F329 Major depressive disorder, single episode, unspecified: Secondary | ICD-10-CM

## 2012-06-22 DIAGNOSIS — E669 Obesity, unspecified: Secondary | ICD-10-CM

## 2012-06-22 DIAGNOSIS — Z124 Encounter for screening for malignant neoplasm of cervix: Secondary | ICD-10-CM

## 2012-06-22 DIAGNOSIS — F3289 Other specified depressive episodes: Secondary | ICD-10-CM

## 2012-06-22 DIAGNOSIS — Z23 Encounter for immunization: Secondary | ICD-10-CM

## 2012-06-22 MED ORDER — TETANUS-DIPHTH-ACELL PERTUSSIS 5-2.5-18.5 LF-MCG/0.5 IM SUSP: 0.5000 mL | Freq: Once | INTRAMUSCULAR | Status: DC

## 2012-06-22 NOTE — Assessment & Plan Note (Signed)
No current complaints. Taking omeprazole only intermittently "when needed." Advised pt to take PPI regularly for better long-term effect.

## 2012-06-22 NOTE — Assessment & Plan Note (Signed)
A: Performed 1/9, sent for cytology and HSV testing. P: Will inform pt of results. If normal, no need to repeat for 5 years.

## 2012-06-22 NOTE — Assessment & Plan Note (Signed)
Per pt history, but denies current complaints. Also denies any previous medication, though nortriptyline is on her med list (removed today). Will screen/monitor routinely.

## 2012-06-22 NOTE — Progress Notes (Signed)
  Subjective:    Patient ID: Erin Osborn, female    DOB: 18-Aug-1980, 32 y.o.   MRN: [id number]  HPI: Pt presents for yearly physical and pap smear. States she feels well without complaints, though she is anxious to "get her pap over with." States she has not had a TDAP shot recently but is agreeable to having one, today. Some questions about the flu shot that she got in September, but no current flu-like symptoms (her husband has had a flu-like illness and she is concerned about getting the flu despite having had the shot).  Chronic conditions discussed: GERD: no current symptoms. Taking omeprazole only "when she has heartburn," but has not needed medication "in a while." Has not taken medication regularly (i.e., daily), in the past. Depression: on problem list, but pt denies ever having any depression diagnosed or treated. Denies having ever taken nortriptyline (currently on med list). No depression or anhedonia currently. Weight: Pt states she knows she is overweight and is working on improving diet/exercise habits. Birth control: Currently on Depo. No complaints. Denies spotting or frank vaginal bleeding.  Review of Systems: As above. Specifically denies chest pain, SOB, abdominal pain, change in bowel/bladder habits, N/V, vaginal discharge, dysuria, or any bleeding.     Objective:   Physical Exam BP 135/87  Pulse 72  Ht 5' 1.25" (1.556 m)  Wt 203 lb (92.08 kg)  BMI 38.04 kg/m2 Gen: adult female in NAD, good spirits, pleasant and cooperative with exam Breast: no masses appreciated in either breast; breasts symmetric and without obvious deformity, tenderness, or expressible nipple discharge Cardio: RRR, no rub/murmur Pulm: CTAB, no wheezes Abd: soft, nontender GU: sterile speculum exam reveals no gross vaginal or cervical discharge, external structures normal without suspicious lesions  Vaginal walls without friability or bleeding/lesions  Cervix with minimal friability on pap  smear  No CMT or adnexal tenderness/masses on bimanual exam; uterus midline, nontender, not enlarged     Assessment & Plan:

## 2012-06-22 NOTE — Assessment & Plan Note (Signed)
Physical exam unremarkable. No subjective complaints. Doing well on Depo for birth control. Pap smear performed. Minor cervical friability. No cervical motion tenderness or adnexal tenderness or masses on bimanual exam. TDAP administered. Follow up in 1 year or sooner if needed.

## 2012-06-22 NOTE — Patient Instructions (Signed)
Thank you for coming in, today! It was good to see you. Your exam looked all normal. I did not find anything suspicious on your breast exam. You should not need another tetanus shot for another 10 years. I will let you know what your pap smear results are. If they are normal, you will not need further testing for 5 more years. Take omeprazole every day; it will work better if you take it regularly instead of only occasionally. If you have any questions/concerns or feel ill, please feel free to come back or call the clinic any time. --Dr. Casper Harrison

## 2012-06-22 NOTE — Assessment & Plan Note (Signed)
Discussed healthy diet/exercise. Recommended 3x per week, 30 minutes per day of moderate exercise.

## 2012-06-28 ENCOUNTER — Other Ambulatory Visit (HOSPITAL_COMMUNITY)
Admission: RE | Admit: 2012-06-28 | Discharge: 2012-06-28 | Disposition: A | Discharge status: Home / Self Care | Payer: Medicaid Other | Source: Ambulatory Visit | Attending: Family Medicine | Admitting: Family Medicine

## 2012-06-28 ENCOUNTER — Encounter: Payer: Self-pay | Admitting: Family Medicine

## 2012-08-10 ENCOUNTER — Ambulatory Visit (INDEPENDENT_AMBULATORY_CARE_PROVIDER_SITE_OTHER): Payer: Medicaid Other | Admitting: *Deleted

## 2012-08-10 DIAGNOSIS — Z309 Encounter for contraceptive management, unspecified: Secondary | ICD-10-CM

## 2012-08-10 MED ORDER — MEDROXYPROGESTERONE ACETATE 150 MG/ML IM SUSP
150.0000 mg | Freq: Once | INTRAMUSCULAR | Status: AC
  Administered 2012-08-10: 150 mg via INTRAMUSCULAR

## 2012-08-10 NOTE — Progress Notes (Signed)
Next Depo due May 15 through Nov 09, 2012

## 2012-09-21 ENCOUNTER — Telehealth: Payer: Self-pay | Admitting: Family Medicine

## 2012-09-21 DIAGNOSIS — K0889 Other specified disorders of teeth and supporting structures: Secondary | ICD-10-CM

## 2012-09-21 NOTE — Telephone Encounter (Signed)
Called pt. Informed pt that we would fax the request to the dental clinic and it may take a while due to waiting list. Pt verbalized understanding. Fwd. To PCP for info. Lorenda Hatchet, Renato Battles I did put in the dental referral.

## 2012-09-21 NOTE — Telephone Encounter (Signed)
Patient now has the orange card and need a referral to the dental clinic.  She hasn't been to the dentist in over 12 years and is having tooth pain.

## 2012-10-30 ENCOUNTER — Encounter: Payer: Self-pay | Admitting: Family Medicine

## 2012-10-30 ENCOUNTER — Ambulatory Visit (INDEPENDENT_AMBULATORY_CARE_PROVIDER_SITE_OTHER): Payer: No Typology Code available for payment source | Admitting: Family Medicine

## 2012-10-30 VITALS — BP 123/85 | HR 83 | Temp 98.1°F | Wt 207.0 lb

## 2012-10-30 DIAGNOSIS — R21 Rash and other nonspecific skin eruption: Secondary | ICD-10-CM

## 2012-10-30 DIAGNOSIS — R238 Other skin changes: Secondary | ICD-10-CM

## 2012-10-30 DIAGNOSIS — L089 Local infection of the skin and subcutaneous tissue, unspecified: Secondary | ICD-10-CM

## 2012-10-30 MED ORDER — ACYCLOVIR 200 MG PO CAPS: 400.0000 mg | ORAL_CAPSULE | Freq: Three times a day (TID) | ORAL | Status: DC

## 2012-10-30 MED ORDER — MUPIROCIN CALCIUM 2 % EX CREA: TOPICAL_CREAM | Freq: Three times a day (TID) | CUTANEOUS | Status: DC

## 2012-10-30 NOTE — Patient Instructions (Signed)
It was nice to meet you today.  It looks like the little bumps on your bottom may be caused by a rash.  I am giving you prescriptions for both a topical anti-bacterial cream as well as a by mouth anti-viral medicine.  Out of the 2, the antiviral medicine is more important. You can get it for $4 at [facility].  Come back to see your regular doctor in a few weeks to discuss the results of the test (we will find out if it was a virus, and if so, which one).  Come back sooner if it is not getting better despite the medicines.

## 2012-10-30 NOTE — Assessment & Plan Note (Signed)
Precepted with Dr. Mauricio Po, appears to be viral in nature, will send viral culture.  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  Will treat with acyclovir.  Pt given bactroban to put on topically in case there is any overlying bacterial super infection.  If not improving with antivirals, should consider po keflex to treat cellulitis.  Red flags for return discussed.  Pt will make appt with PCP to discuss viral culture results in 1-2 weeks.

## 2012-10-30 NOTE — Progress Notes (Signed)
S: Pt comes in today for SDA for insect bite on bottom.  Patient reports that this is the second time she has had an infected bug bite on the same spot of her bottom (the other time was >1 year ago)-- she was given a by mouth medicine for it and it cleared up.  She reports that on Friday (4 days ago), she noticed a little bump on her L buttock that looked like a mosquito bite.  Over the weekend, it got bigger and redder until yesterday when she noticed some white pus spots on top of it.  It has not drained, and she has not noticed any liquid on her underwear.  The area is not really painful, she just can tell that it's there.  It is very itchy and she has been scratching it, but not with her hands.  She denies fevers/chills, N/V/D currently.   ROS: Per HPI  History  Smoking status  . Never Smoker   Smokeless tobacco  . Never Used    O:  Filed Vitals:   10/30/12 1451  BP: 123/85  Pulse: 83  Temp: 98.1 F (36.7 C)    Gen: NAD Skin: 2.3cm area of induration and erythema on middle of L buttock, no fluctuance, no drainage but does have multiple, raised, white vesicles (10-15), all 2-73mm overlying the area of redness.  Purulent d/c appreciated when vesicles are unroofed.    A/P: 32 y.o. female p/w viral rash on buttock -See problem list -f/u in 1-2 weeks

## 2012-11-05 ENCOUNTER — Emergency Department (HOSPITAL_COMMUNITY)
Admission: EM | Admit: 2012-11-05 | Discharge: 2012-11-05 | Disposition: A | Discharge status: Home / Self Care | Payer: No Typology Code available for payment source | Attending: Emergency Medicine | Admitting: Emergency Medicine

## 2012-11-05 ENCOUNTER — Encounter (HOSPITAL_COMMUNITY): Payer: Self-pay | Admitting: Emergency Medicine

## 2012-11-05 DIAGNOSIS — R51 Headache: Secondary | ICD-10-CM | POA: Insufficient documentation

## 2012-11-05 DIAGNOSIS — B9789 Other viral agents as the cause of diseases classified elsewhere: Secondary | ICD-10-CM | POA: Insufficient documentation

## 2012-11-05 DIAGNOSIS — J029 Acute pharyngitis, unspecified: Secondary | ICD-10-CM

## 2012-11-05 DIAGNOSIS — Z8659 Personal history of other mental and behavioral disorders: Secondary | ICD-10-CM | POA: Insufficient documentation

## 2012-11-05 DIAGNOSIS — J45909 Unspecified asthma, uncomplicated: Secondary | ICD-10-CM | POA: Insufficient documentation

## 2012-11-05 DIAGNOSIS — J3489 Other specified disorders of nose and nasal sinuses: Secondary | ICD-10-CM | POA: Insufficient documentation

## 2012-11-05 DIAGNOSIS — J069 Acute upper respiratory infection, unspecified: Secondary | ICD-10-CM | POA: Insufficient documentation

## 2012-11-05 DIAGNOSIS — M542 Cervicalgia: Secondary | ICD-10-CM | POA: Insufficient documentation

## 2012-11-05 DIAGNOSIS — Z8669 Personal history of other diseases of the nervous system and sense organs: Secondary | ICD-10-CM | POA: Insufficient documentation

## 2012-11-05 DIAGNOSIS — B349 Viral infection, unspecified: Secondary | ICD-10-CM

## 2012-11-05 MED ORDER — CYCLOBENZAPRINE HCL 10 MG PO TABS: 5.0000 mg | ORAL_TABLET | Freq: Two times a day (BID) | ORAL | Status: DC | PRN

## 2012-11-05 MED ORDER — CLINDAMYCIN HCL 150 MG PO CAPS: ORAL_CAPSULE | ORAL | Status: DC

## 2012-11-05 MED ORDER — IBUPROFEN 800 MG PO TABS
800.0000 mg | ORAL_TABLET | Freq: Once | ORAL | Status: AC
  Administered 2012-11-05: 800 mg via ORAL
  Filled 2012-11-05: qty 1

## 2012-11-05 NOTE — ED Provider Notes (Signed)
History     CSN: [id number]  Arrival date & time 11/05/12  0531   First MD Initiated Contact with Patient 11/05/12 [phone]      Chief Complaint  Patient presents with  . Sore Throat  . Facial Pain    (Consider location/radiation/quality/duration/timing/severity/associated sxs/prior treatment) HPI Comments: Erin Osborn is a 32 y/o F with PMHx of depression, asthma, CTS presenting to the ED with sore throat and nasal congestion. Patient reported that the sore throat started approximately 3 days ago, described discomfort as a constant scratching sensation that gets worse when swallowing foods and liquids. Patient reported nasal congestion that started approximately 2 days ago - stated that when she blows her nose she has clear mucus. Patient stated that yesterday, before going to bed she noticed that her right ear and neck became sore. Stated that there with discomfort when she moves her neck from side to side mainly to the right side of the neck. Reported tearing eyes yesterday, clear. Stated that she was up all night yesterday due to the discomfort of her throat - that is why she is presenting today. Patient reported that she has not been taking anything for the discomfort. Denied fever, chills, sweating, abdominal pain, melena, hematochezia, n/v/d, cough, chest pain, shortness of breath, difficulty breathing, cough, muffled ear sounds, numbness, tingling, weakness, change in vision/visual distortions, changes to voice, hoarseness.  PCP: Dr. Salena [address]   Patient is a 32 y.o. female presenting with pharyngitis. The history is provided by the patient. No language interpreter was used.  Sore Throat Associated symptoms include congestion (Nasal), neck pain (Right sided neck pain x 1 day) and a sore throat. Pertinent negatives include no abdominal pain, chest pain, chills, coughing, fatigue, fever, headaches, nausea, numbness, rash, vomiting or weakness.    Past Medical History  Diagnosis Date    . Depression   . Asthma   . CTS (carpal tunnel syndrome) 09/2003    Past Surgical History  Procedure Laterality Date  . Cholecystectomy      Around 32 years of age    Family History  Problem Relation Age of Onset  . Hypertension Mother   . Diabetes Mother   . Hypertension Maternal Grandmother   . Diabetes Maternal Grandmother   . Acute myelogenous leukemia Neg Hx     History  Substance Use Topics  . Smoking status: Never Smoker   . Smokeless tobacco: Never Used  . Alcohol Use: No    OB History   Grav Para Term Preterm Abortions TAB SAB Ect Mult Living                  Review of Systems  Constitutional: Negative for fever, chills and fatigue.  HENT: Positive for congestion (Nasal), sore throat and neck pain (Right sided neck pain x 1 day). Negative for trouble swallowing, dental problem and voice change.   Eyes: Negative for photophobia, pain and visual disturbance.  Respiratory: Negative for cough, chest tightness and shortness of breath.   Cardiovascular: Negative for chest pain and palpitations.  Gastrointestinal: Negative for nausea, vomiting, abdominal pain, diarrhea, constipation, blood in stool and anal bleeding.  Genitourinary: Negative for dysuria, hematuria, flank pain, decreased urine volume and difficulty urinating.  Skin: Negative for rash.  Neurological: Negative for dizziness, weakness, light-headedness, numbness and headaches.  All other systems reviewed and are negative.    Allergies  Review of patient's allergies indicates no known allergies.  Home Medications   Current Outpatient Rx  Name  Route  Sig  Dispense  Refill  . clindamycin (CLEOCIN) 150 MG capsule      Take 3 tablets PO TID x 5 days   45 capsule   0   . medroxyPROGESTERone (DEPO-PROVERA) 150 MG/ML injection   Intramuscular   Inject 1 mL (150 mg total) into the muscle every 3 (three) months. Per protocol.   1 mL   0     BP 129/84  Pulse 86  Temp(Src) 98.6 F (37 C)  (Oral)  Resp 16  SpO2 100%  Physical Exam  Nursing note and vitals reviewed. Constitutional: She is oriented to person, place, and time. She appears well-developed and well-nourished. No distress.  HENT:  Head: Normocephalic and atraumatic.  Mouth/Throat: Oropharynx is clear and moist. No oropharyngeal exudate.  Uvula midline, symmetrical elevation  Negative facial swelling noted.   Mouth/Oropharynx: Negative sores, lesions, swelling, inflammation noted to the mouth. Negative inflammation, swelling, sores, lesions noted to the gums and buccal mucosa bilaterally. Mild erythema noted to the posterior oropharynx. Negative enlargement of the tonsils. Negative swelling noted. Negative peritonsillar abscess noted on either side. Negative exudate noted. Negative petechiae noted.   Ears: Negative swelling, erythema, inflammation, drainage noted to the ears bilaterally. Bilateral cerumen impaction  Eyes: Conjunctivae and EOM are normal. Pupils are equal, round, and reactive to light. Right eye exhibits no discharge. Left eye exhibits no discharge.  Neck: Normal range of motion. Neck supple. No tracheal deviation present. No thyromegaly present.    Negative neck stiffness Negative nuchal rigidity Negative lymphadenopathy  Mild discomfort upon palpation to the neck on the right side - patient has full ROM to the neck.  Negative swelling noted to the neck Negative masses noted to the neck   Cardiovascular: Normal rate, regular rhythm and normal heart sounds.  Exam reveals no friction rub.   No murmur heard. Pulmonary/Chest: Effort normal and breath sounds normal. No respiratory distress. She has no wheezes. She has no rales.  Lymphadenopathy:    She has no cervical adenopathy.  Neurological: She is alert and oriented to person, place, and time. No cranial nerve deficit. She exhibits normal muscle tone. Coordination normal. GCS eye subscore is 4. GCS verbal subscore is 5. GCS motor subscore is 6.   Cranial nerves III-XII grossly intact  Skin: Skin is warm and dry. No rash noted. She is not diaphoretic. No erythema.  Psychiatric: She has a normal mood and affect. Her behavior is normal. Thought content normal.    ED Course  Procedures (including critical care time)  Ibuprofen 800 mg PO given in ED  Labs Reviewed  RAPID STREP SCREEN  CULTURE, GROUP A STREP   No results found.   1. Viral illness   2. Sore throat   3. URI (upper respiratory infection)   4. Asthma       MDM  Patient is afebrile, non-tachycardic, non-tachypneic, normotensive, adequate saturation on room air, alert and oriented,. Negative fever, neck stiffness, nuchal rigidity - less likely to be meningitis.  Negative swelling to the oropharynx, negative peritonsillar abscess noted at the moment. Mild discomfort upon palpation of the right side of the neck - possibly be the beginnings of peritonsillar abscess. Rapid Strep Test negative - Group A Strep culture being processed.  Discussed case with Dr. Haywood Lasso - with negative fever, negative oropharynx swelling, negative enlarged tonsils, negative change in voice - agreed imaging not warranted at the moment - recommended antibiotic therapy.  Patient aseptic, non-toxic appearing, in no acute distress,  in no respiratory distress. Ibuprofen aided in relief. Negative signs of peritonsillar abscess noted - negative torticollis - negative neck masses felt - negative lymphadenopathy noted. Suspected URI, viral illness leading to sore throat - suspected viral etiology. Neck discomfort suspected to be musculoskeletal in nature. Discharged patient. Discharged patient with antibiotics for coverage in case of beginnings of peritonsillar abscess, possible infection beginnings. Discharged with a small dose of muscle relaxers for neck discomfort. Discussed with patient the course of the medications and the precautions. Discussed with patient to rest and remain hydrated. Referred  patient to PCP and ENT to follow-up next week. Discussed with patient to apply heat compressions to the neck to aid in relief. Discussed with patient to monitor symptoms and if symptoms are to worsen or change to report back to the ED. Patient agreed to plan of care, understood, all questions answered.         Raymon Mutton, PA-C 11/05/12 [phone]

## 2012-11-05 NOTE — ED Notes (Signed)
Pt presents with sore throat for approx 1 week. Pt's tonsils are 1+ and red on assessment and no exudate was noted. Pt's airway is intact, respirations unlabored and even. Pt also reports nack pain and facial pain that developed 2 days ago predominantly on the Right side.

## 2012-11-05 NOTE — ED Notes (Signed)
Pt reports having a sore throat for approx 1 week. Pt reports pain on swallowing that has progressively worsened. Pt states 2 days ago her face and neck started hurting.

## 2012-11-06 NOTE — ED Provider Notes (Signed)
Medical screening examination/treatment/procedure(s) were performed by non-physician practitioner and as supervising physician I was immediately available for consultation/collaboration.  Sayf Kerner M Daelynn Blower, MD 11/06/12 0357 

## 2012-11-07 LAB — CULTURE, GROUP A STREP

## 2012-11-08 ENCOUNTER — Ambulatory Visit (INDEPENDENT_AMBULATORY_CARE_PROVIDER_SITE_OTHER): Payer: No Typology Code available for payment source | Admitting: *Deleted

## 2012-11-08 DIAGNOSIS — Z309 Encounter for contraceptive management, unspecified: Secondary | ICD-10-CM

## 2012-11-08 MED ORDER — MEDROXYPROGESTERONE ACETATE 150 MG/ML IM SUSP
150.0000 mg | Freq: Once | INTRAMUSCULAR | Status: AC
  Administered 2012-11-08: 150 mg via INTRAMUSCULAR

## 2012-11-08 NOTE — Progress Notes (Signed)
Pt here for depo. Depo given left upper outer quead. Next depo due aug 13-27. Reminder card given. Wyatt Haste, RN-BSN

## 2012-12-25 ENCOUNTER — Ambulatory Visit (INDEPENDENT_AMBULATORY_CARE_PROVIDER_SITE_OTHER): Payer: No Typology Code available for payment source | Admitting: Family Medicine

## 2012-12-25 ENCOUNTER — Encounter: Payer: Self-pay | Admitting: Family Medicine

## 2012-12-25 VITALS — BP 131/86 | HR 66 | Temp 99.5°F | Ht 62.0 in | Wt 206.0 lb

## 2012-12-25 DIAGNOSIS — K089 Disorder of teeth and supporting structures, unspecified: Secondary | ICD-10-CM

## 2012-12-25 DIAGNOSIS — K0889 Other specified disorders of teeth and supporting structures: Secondary | ICD-10-CM

## 2012-12-25 MED ORDER — AMOXICILLIN-POT CLAVULANATE 875-125 MG PO TABS: 1.0000 | ORAL_TABLET | Freq: Two times a day (BID) | ORAL | Status: DC

## 2012-12-25 NOTE — Progress Notes (Signed)
Patient ID: Erin Osborn    DOB: 1981-05-23, 32 y.o.   MRN: [id number] --- Subjective:  Kenyon is a 32 y.o.female who presents with left lower tooth pain. Present for 3 weeks. Pain is throbbing, 6/10, intermittent pain. When present, can last up to several hours. Pain is worst with chewing on that side but still has been able to eat and drink normally. She denies any swelling in the area. She denies any fevers or chills. She has been trying mouth wash and salt water rinses which have not helped.  She was seen by a dentist 2 months ago who recommended wisdom tooth removal in September.    ROS: see HPI Past Medical History: reviewed and updated medications and allergies. Social History: Tobacco: none  Objective: Filed Vitals:   12/25/12 0927  BP: 131/86  Pulse: 66  Temp: 99.5 F (37.5 C)    Physical Examination:   General appearance - alert, well appearing, and in no distress  Mouth - mucous membranes moist, edematous soft tissue around left lower gingiva, left lower wisdom tooth cutting into gum.   Neck - supple, no significant adenopathy

## 2012-12-25 NOTE — Patient Instructions (Addendum)
You have an infection of the gum, likely caused by the wisdom tooth cutting into it.  Continue with the salt rinses. Start the antibiotics. You can also take ibuprofen 600mg  every 6 hours as needed for pain.   Gingivitis Gingivitis is a form of gum (periodontal) disease that causes redness, soreness, and swelling (inflammation) of your gums. CAUSES The most common cause of gingivitis is poor oral hygiene. A sticky substance made of bacteria, mucus, and food particles (plaque), is deposited on the exposed part of teeth. As plaque builds up, it reacts with the saliva in your mouth to form something called  tartar. Tartar is a hard deposit that becomes trapped around the base of the tooth. Plaque and tartar irritate the gums, leading to the formation of gingivitis. Other factors that increase your risk for gingivitis include:   Tobacco use.  Diabetes.  Older age.  Certain medications.  Certain viral or fungal infections.  Dry mouth.  Hormonal changes such as during pregnancy.  Poor nutrition.  Substance abuse.  Poor fitting dental restorations or appliances. SYMPTOMS You may notice inflammation of the soft tissue (gingiva) around the teeth. When these tissues become inflamed, they bleed easily, especially during flossing or brushing. The gums may also be:   Tender to the touch.  Bright red, purple red, or have a shiny appearance.  Swollen.  Wearing away from the teeth (receding), which exposes more of the tooth. Bad breath is often present. Continued infection around teeth can eventually cause cavities and loosen teeth. This may lead to eventual tooth loss. DIAGNOSIS A medical and dental history will be taken. Your mouth, teeth, and gums will be examined. Your dentist will look for soft, swollen purple-red, irritated gums. There may be deposits of plaque and tartar at the base of the teeth. Your gums will be looked at for the degree of redness, puffiness, and bleeding tendencies.  Your dentist will see if any of the teeth are loose. X-rays may be taken to see if the inflammation has spread to the supporting structures of the teeth. TREATMENT The goal is to reduce and reverse the inflammation. Proper treatment can usually reverse the symptoms of gingivitis and prevent further progression of the disease. Have your teeth cleaned. During the cleaning, all plaque and tartar will be removed. Instruction for proper home care will be given. You will need regular professional cleanings and check-ups in the future. HOME CARE INSTRUCTIONS  Brush your teeth twice a day and floss at least once per day. When flossing, it is best to floss first then brush.  Limit sugar between meals and maintain a well-balanced diet.  Even the best dental hygiene will not prevent plaque from developing. It is necessary for you to see your dentist on a regular basis for cleaning and regular checkups.  Your dentist can recommend proper oral hygiene and mouth care and suggest special toothpastes or mouth rinses.  Stop smoking. SEEK DENTAL OR MEDICAL CARE IF:  You have painful, reddened tissue around your teeth, or you have puffy swollen gums.  You have difficulty chewing.  You notice any loose or infected teeth.  You have swollen glands.  Your gums bleed easily when you brush your teeth or are very tender to the touch. Document Released: 11/24/2000 Document Revised: 08/23/2011 Document Reviewed: 09/04/2010 [facility] Patient Information 2014 [place], [place].

## 2012-12-25 NOTE — Assessment & Plan Note (Signed)
Likely from impaction of wisdom tooth.  - start augmentin - continue salt water rinses and oral care - follow up with her dentist with possible speeding of referral to [facility] center for coverage of wisdom tooth removal (for patient with orange card).

## 2013-01-23 ENCOUNTER — Telehealth: Payer: Self-pay | Admitting: *Deleted

## 2013-01-23 NOTE — Telephone Encounter (Signed)
Pt reports red spot on buttocks similar to what was on right side a few months ago - advised that she could use ointment as previously used for other side but if no improvement in 48 hours to come in for appointment - pt verbalized understanding Wyatt Haste, RN-BSN

## 2013-02-07 ENCOUNTER — Ambulatory Visit (INDEPENDENT_AMBULATORY_CARE_PROVIDER_SITE_OTHER): Payer: No Typology Code available for payment source | Admitting: *Deleted

## 2013-02-07 DIAGNOSIS — Z309 Encounter for contraceptive management, unspecified: Secondary | ICD-10-CM

## 2013-02-07 MED ORDER — MEDROXYPROGESTERONE ACETATE 150 MG/ML IM SUSP
150.0000 mg | Freq: Once | INTRAMUSCULAR | Status: AC
  Administered 2013-02-07: 150 mg via INTRAMUSCULAR

## 2013-02-07 NOTE — Progress Notes (Signed)
Pt here for depo. Depo given. Next depo due nov 11-29 Wyatt Haste, RN-BSN

## 2013-03-15 ENCOUNTER — Ambulatory Visit (INDEPENDENT_AMBULATORY_CARE_PROVIDER_SITE_OTHER): Payer: No Typology Code available for payment source | Admitting: *Deleted

## 2013-03-15 DIAGNOSIS — Z23 Encounter for immunization: Secondary | ICD-10-CM

## 2013-04-03 ENCOUNTER — Ambulatory Visit: Payer: Self-pay

## 2013-04-10 ENCOUNTER — Ambulatory Visit: Payer: Medicaid Other

## 2013-05-03 ENCOUNTER — Ambulatory Visit: Payer: Self-pay

## 2013-05-07 ENCOUNTER — Ambulatory Visit (INDEPENDENT_AMBULATORY_CARE_PROVIDER_SITE_OTHER): Payer: No Typology Code available for payment source | Admitting: *Deleted

## 2013-05-07 DIAGNOSIS — Z309 Encounter for contraceptive management, unspecified: Secondary | ICD-10-CM

## 2013-05-07 MED ORDER — MEDROXYPROGESTERONE ACETATE 150 MG/ML IM SUSP
150.0000 mg | Freq: Once | INTRAMUSCULAR | Status: AC
  Administered 2013-05-07: 150 mg via INTRAMUSCULAR

## 2013-05-07 NOTE — Progress Notes (Signed)
Patient in today for depo. Injection given in left deltoid, site unremarkable, patient without complaints. Next depo due February 9 - February 23, patient aware.

## 2013-05-16 ENCOUNTER — Ambulatory Visit: Payer: Self-pay

## 2013-06-12 ENCOUNTER — Ambulatory Visit (INDEPENDENT_AMBULATORY_CARE_PROVIDER_SITE_OTHER): Payer: No Typology Code available for payment source | Admitting: Family Medicine

## 2013-06-12 ENCOUNTER — Telehealth: Payer: Self-pay | Admitting: Family Medicine

## 2013-06-12 ENCOUNTER — Encounter: Payer: Self-pay | Admitting: Family Medicine

## 2013-06-12 VITALS — BP 126/88 | HR 83 | Temp 98.3°F | Ht 62.0 in | Wt 202.0 lb

## 2013-06-12 DIAGNOSIS — J329 Chronic sinusitis, unspecified: Secondary | ICD-10-CM

## 2013-06-12 MED ORDER — AMOXICILLIN 875 MG PO TABS: 875.0000 mg | ORAL_TABLET | Freq: Two times a day (BID) | ORAL | Status: DC

## 2013-06-12 NOTE — Telephone Encounter (Signed)
Pt was seen on 12/30 and was given a note so she can go back to work. The issue is she lost it and would like another one print out and left up front. jw

## 2013-06-12 NOTE — Progress Notes (Signed)
SUBJECTIVE:  Erin Osborn is a 32 y.o. female who complains of coryza, congestion, sneezing and dry cough for 10 days. She denies a history of chest pain, chills, fevers and myalgias and denies a history of asthma. Patient denies smoke cigarettes.   OBJECTIVE: BP 126/88  Pulse 83  Temp(Src) 98.3 F (36.8 C) (Oral)  Ht 5\' 2"  (1.575 m)  Wt 202 lb (91.627 kg)  BMI 36.94 kg/m2 Gen:  Patient sitting on exam table, appears stated age in no acute distress Head: Normocephalic atraumatic Eyes: EOMI, PERRL, sclera and conjunctiva non-erythematous Ears:  Canals clear bilaterally.  TMs pearly gray bilaterally without erythema or bulging.   Nose:  Nasal turbinates grossly enlarged bilaterally. Some exudates noted. Tender to palpation of maxillary sinus  Mouth: Mucosa membranes moist. Tonsils +2, nonenlarged, non-erythematous. Neck: No cervical lymphadenopathy noted Heart:  RRR, no murmurs auscultated. Pulm:  Clear to auscultation bilaterally with good air movement.  No wheezes or rales noted.

## 2013-06-12 NOTE — Assessment & Plan Note (Signed)
Plan to treat due to length of symptoms and no improvement.   Will prescribe Amoxicillin x 7 days. Instructed patient to return in 1 week for checkup or sooner if worsening or no improvement.

## 2013-06-12 NOTE — Telephone Encounter (Signed)
Pt came by and got another letter already.  Karuna Balducci, Darlyne Russian, CMA

## 2013-06-12 NOTE — Patient Instructions (Signed)
Take the Amoxicillin  For the next 7 days   Use the Afrin to clear up your nose for 3 days and no more (oxymetazoline).  Twice a day  If you start having worsening fevers, trouble breathing, or not getting better come back and see [place].  This should be clear by the weekend or so.  Feel better!

## 2013-06-13 ENCOUNTER — Ambulatory Visit: Payer: Self-pay | Admitting: Family Medicine

## 2013-06-16 ENCOUNTER — Emergency Department (HOSPITAL_COMMUNITY)
Admission: EM | Admit: 2013-06-16 | Discharge: 2013-06-16 | Disposition: A | Discharge status: Home / Self Care | Payer: Medicaid Other | Attending: Emergency Medicine | Admitting: Emergency Medicine

## 2013-06-16 ENCOUNTER — Encounter (HOSPITAL_COMMUNITY): Payer: Self-pay | Admitting: Emergency Medicine

## 2013-06-16 ENCOUNTER — Emergency Department (HOSPITAL_COMMUNITY): Payer: Medicaid Other

## 2013-06-16 DIAGNOSIS — Z8669 Personal history of other diseases of the nervous system and sense organs: Secondary | ICD-10-CM | POA: Insufficient documentation

## 2013-06-16 DIAGNOSIS — R070 Pain in throat: Secondary | ICD-10-CM | POA: Insufficient documentation

## 2013-06-16 DIAGNOSIS — Z8659 Personal history of other mental and behavioral disorders: Secondary | ICD-10-CM | POA: Insufficient documentation

## 2013-06-16 DIAGNOSIS — R1310 Dysphagia, unspecified: Secondary | ICD-10-CM | POA: Insufficient documentation

## 2013-06-16 DIAGNOSIS — Z792 Long term (current) use of antibiotics: Secondary | ICD-10-CM | POA: Insufficient documentation

## 2013-06-16 DIAGNOSIS — J45909 Unspecified asthma, uncomplicated: Secondary | ICD-10-CM | POA: Insufficient documentation

## 2013-06-16 DIAGNOSIS — K1120 Sialoadenitis, unspecified: Secondary | ICD-10-CM | POA: Insufficient documentation

## 2013-06-16 MED ORDER — IBUPROFEN 400 MG PO TABS
800.0000 mg | ORAL_TABLET | Freq: Once | ORAL | Status: AC
  Administered 2013-06-16: 800 mg via ORAL
  Filled 2013-06-16: qty 2

## 2013-06-16 MED ORDER — CLINDAMYCIN HCL 300 MG PO CAPS: 300.0000 mg | ORAL_CAPSULE | Freq: Three times a day (TID) | ORAL | Status: DC

## 2013-06-16 MED ORDER — CLINDAMYCIN HCL 150 MG PO CAPS
300.0000 mg | ORAL_CAPSULE | Freq: Once | ORAL | Status: AC
  Administered 2013-06-16: 300 mg via ORAL
  Filled 2013-06-16: qty 2

## 2013-06-16 NOTE — Discharge Instructions (Signed)
Sialadenitis  Sialadenitis is an inflammation (soreness) of the salivary glands. The parotid is the main salivary gland. It lies behind the angle of the jaw below the ear. The saliva produced comes out of a tiny opening (duct) inside the cheek on either side. This is usually at the level of the upper back teeth. If it is swollen, the ear is pushed up and out. This helps tell this condition apart from a simple lymph gland infection (swollen glands) in the same area. Mumps has mostly disappeared since the start of immunization against mumps. Now the most common cause of parotitis is germ (bacterial) infection or inflammation of the lymphatics (the lymph channels). The other major salivary gland is located in the floor of the mouth. Smaller salivary glands are located in the mouth. This includes the:   Lips.   Lining of the mouth.   Pharynx.   Hard palate (front part of the roof of the mouth).  The salivary glands do many things, including:   Lubrication.   Breaking down food.   Production of hormones and antibodies (to protect against germs which may cause illness).   Help with the sense of taste.  ACUTE BACTERIAL SIALADENITIS  This is a sudden inflammatory response to bacterial infection. This causes redness, pain, swelling and tenderness over the infected gland. In the past, it was common in dehydrated and debilitated patients often following an operation. It is now more commonly seen:   After radiotherapy.   In patients with poor immune systems.  Treatment is:   The correction of fluid balance (rehydration).   Medicine that kill germs (antibiotics).   Pain relief.  CHRONIC RECURRENT SIALADENITIS  This refers to repeated episodes of discomfort and swelling of one of the salivary glands. It often occurs after eating. Chronic sialadenitis is usually less painful. It is associated with recurrent enlargement of a salivary gland, often following meals, and typically with an absence of redness. The chronic  form of the disease often is associated with conditions linked to decreased salivary flow, rather than dehydration (loss of body fluids). These conditions include:   A stone, or concretion, formed in the gallbladder, kidneys, or other parts of the body (calculi).   Salivary stasis.   A change in the fluid and electrolyte (the salts in your body fluids) makeup of the gland.  It is treated with:   Gland massage.   Methods to stimulate the flow of saliva, (for example, lemon juice).   Antibiotics if required.  Surgery to remove the gland is possible, but its benefits need to be balanced against risks.   VIRAL SIALADENITIS  Several viruses infect the salivary glands. Some of these include the mumps virus that commonly infects the parotid gland. Other viruses causing problems are:   The HIV virus.   Herpes.   Some of the influenza ("flu") viruses.  RECURRENT SIALADENITIS IN CHILDREN  This condition is thought to be due to swelling or ballooning of the ducts. It results in the same symptoms as acute bacterial parotitis. It is usually caused by germs (bacteria). It is often treated using penicillin. It may get well without treatment. Surgery is usually not required.  TUBERCULOUS SIALADENITIS  The salivary glands may become infected with the same bacteria causing tuberculosis ("TB"). Treatment is with anti-tuberculous antibiotic therapy.  OTHER UNCOMMON CAUSES OF SIALADENITIS    Sjogren's syndrome is a condition in which arthritis is associated with a decrease in activity of the glands of the body that produce saliva   and tears. The diagnosis is made with blood tests or by examination of a piece of tissue from the inside of the lip. Some people with this condition are bothered by:   A dry mouth.   Intermittent salivary gland enlargement.   Atypical mycobacteria is a germ similar to tuberculosis. It often infects children. It is often resistant to antibiotic treatment. It may require surgical treatment to remove  the infected salivary gland.   Actinomycosis is an infection of the parotid gland that may also involve the overlying skin. The diagnosis is made by detecting granules of sulphur produced by the bacteria on microscopic examination. Treatment is a prolonged course of penicillin for up to one year.   Nutritional causes include vitamin deficiencies and bulimia.   Diabetes and problems with your thyroid.   Obesity, cirrhosis, and malabsorption are some metabolic causes.  HOME CARE INSTRUCTIONS    Apply ice bags every 2 hours for 15-20 minutes, while awake, to the sore gland for 24 hours, then as directed by your caregiver. Place the ice in a plastic bag with a towel around it to prevent frostbite to the skin.   Only take over-the-counter or prescription medicines for pain, discomfort, or fever as directed by your caregiver.  SEEK IMMEDIATE MEDICAL CARE IF:    There is increased pain or swelling in your gland that is not controlled with medicine.   An oral temperature above 102 F (38.9 C) develops, not controlled by medicine.   You develop difficulty opening your mouth, swallowing, or speaking.  Document Released: 11/20/2001 Document Revised: 08/23/2011 Document Reviewed: 01/15/2008  [employer] Patient Information 2014 [employer].

## 2013-06-16 NOTE — ED Notes (Signed)
Pt c/o rt neck pain, states she woke up this afternoon and noticed neck was swollen and she felt like she had a "knot" in her neck. Pt states it's painful to swallow. No difficulty breathing, airway intact. Pt rates pain 4-5/10. Pt states she did not take any medication for pain. Pt states she needs wisdom teeth pulled on rt side but she is currently not having any dental pain.

## 2013-06-16 NOTE — ED Provider Notes (Signed)
CSN: [id number]     Arrival date & time 06/16/13  1731 History  This chart was scribed for non-physician practitioner Felicie Morn, NP working with Celene Kras, MD by Danella Maiers, ED Scribe. This patient was seen in room TR07C/TR07C and the patient's care was started at 7:52 PM.   Chief Complaint  Patient presents with  . Dental Pain   Patient is a 33 y.o. female presenting with general illness. The history is provided by the patient. No language interpreter was used.  Illness Onset quality:  Gradual Duration:  12 hours Timing:  Constant Chronicity:  New Associated symptoms: no abdominal pain, no ear pain, no fever, no nausea and no sore throat    HPI Comments: Erin Osborn is a 33 y.o. female who presents to the Emergency Department complaining of right-sided throat pain. She states she woke this morning with a painful "knot" to her right anterior neck. She reports having trouble swallowing sometimes. She denies fever, chills, sore throat, nausea, vomiting, abdominal pain, otalgia.    Past Medical History  Diagnosis Date  . Depression   . Asthma   . CTS (carpal tunnel syndrome) 09/2003   Past Surgical History  Procedure Laterality Date  . Cholecystectomy      Around 33 years of age   Family History  Problem Relation Age of Onset  . Hypertension Mother   . Diabetes Mother   . Hypertension Maternal Grandmother   . Diabetes Maternal Grandmother   . Acute myelogenous leukemia Neg Hx    History  Substance Use Topics  . Smoking status: Never Smoker   . Smokeless tobacco: Never Used  . Alcohol Use: No   OB History   Grav Para Term Preterm Abortions TAB SAB Ect Mult Living                 Review of Systems  Constitutional: Negative for fever.  HENT: Positive for trouble swallowing. Negative for ear pain and sore throat.   Gastrointestinal: Negative for nausea and abdominal pain.  Musculoskeletal: Positive for neck pain.  All other systems reviewed and are  negative.    Allergies  Review of patient's allergies indicates no known allergies.  Home Medications   Current Outpatient Rx  Name  Route  Sig  Dispense  Refill  . amoxicillin (AMOXIL) 875 MG tablet   Oral   Take 1 tablet (875 mg total) by mouth 2 (two) times daily.   14 tablet   0   . medroxyPROGESTERone (DEPO-PROVERA) 150 MG/ML injection   Intramuscular   Inject 1 mL (150 mg total) into the muscle every 3 (three) months. Per protocol.   1 mL   0    BP 130/92  Pulse 65  Temp(Src) 99.4 F (37.4 C) (Oral)  Resp 18  SpO2 100% Physical Exam  Nursing note and vitals reviewed. Constitutional: She is oriented to person, place, and time. She appears well-developed and well-nourished. No distress.  HENT:  Head: Normocephalic and atraumatic.  Eyes: EOM are normal.  Neck: Neck supple. No tracheal deviation present.  Submandibular swelling on the right  Cardiovascular: Normal rate.   Pulmonary/Chest: Effort normal. No respiratory distress.  Musculoskeletal: Normal range of motion.  Neurological: She is alert and oriented to person, place, and time.  Skin: Skin is warm and dry.  Psychiatric: She has a normal mood and affect. Her behavior is normal.    ED Course  Procedures (including critical care time) Medications - No data to display  DIAGNOSTIC STUDIES: Oxygen Saturation is 100% on RA, normal by my interpretation.    COORDINATION OF CARE: 8:00 PM- Discussed treatment plan with pt which includes CT neck. Pt agrees to plan.    Labs Review Labs Reviewed - No data to display Imaging Review Ct Soft Tissue Neck Wo Contrast  06/16/2013   CLINICAL DATA:  Submandibular swelling/tenderness.  EXAM: CT NECK WITHOUT CONTRAST  TECHNIQUE: Multidetector CT imaging of the neck was performed following the standard protocol without intravenous contrast.  COMPARISON:  None.  FINDINGS: Orbits are normal and symmetric. The paranasal sinuses are well aerated with minimal opacification  over the ethmoid sinus and mild mucosal membrane thickening over the floor the maxillary sinuses. Spaces of the suprahyoid neck are notable for mild enlargement of the right submandibular gland compared to the left with adjacent stranding of the fat with minimal fluid. No definite salivary glands stone is seen. There are a few small reactive cervical chain and submandibular/submental lymph nodes. The airway is patent. Note that there is elongation with pseudoarticulation of the right styloid process of the temporal bone extending all the way to the hyoid bone and measuring approximately 5.8cm in length. There is a more normal length left styloid process, although there is calcification of the stylohyoid ligament.  The spaces of the infrahyoid neck are within normal. Visualized superior mediastinum and lung apices are normal.  IMPRESSION: Inflammation of the right submandibular compatible with sialoadenitis. No focal stone seen.  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 Calcification of the left stylohyoid ligament.  Minimal chronic sinus inflammatory disease.   Electronically Signed   By: Elberta Fortisaniel  Boyle M.D.   On: 06/16/2013 21:15    EKG Interpretation   None       MDM  Sialadenitis. Non-toxic appearing.  No difficulty swallowing.  Will start on cleocin.  Return precautions discussed.  Follow-up with PCP.  I personally performed the services described in this documentation, which was scribed in my presence. The recorded information has been reviewed and is accurate.    Jimmye Normanavid John Paulett Kaufhold, NP 06/17/13 [phone]

## 2013-06-16 NOTE — ED Notes (Signed)
Discharge instructions reviewed with pt, pt verbalized understanding.

## 2013-06-16 NOTE — ED Notes (Signed)
Reports mild dental pain last night, woke up this am with a "knot" in her throat, under her right jawline. Denies sore throat. Airway is intact.

## 2013-06-17 NOTE — ED Provider Notes (Signed)
Medical screening examination/treatment/procedure(s) were performed by non-physician practitioner and as supervising physician I was immediately available for consultation/collaboration.    Geovanie Winnett R Kwesi Sangha, MD 06/17/13 1546 

## 2013-07-26 ENCOUNTER — Ambulatory Visit: Payer: Self-pay

## 2013-08-06 ENCOUNTER — Ambulatory Visit: Payer: Self-pay

## 2013-08-16 ENCOUNTER — Encounter (HOSPITAL_COMMUNITY): Payer: Self-pay | Admitting: Emergency Medicine

## 2013-08-16 ENCOUNTER — Emergency Department (INDEPENDENT_AMBULATORY_CARE_PROVIDER_SITE_OTHER)
Admission: EM | Admit: 2013-08-16 | Discharge: 2013-08-16 | Disposition: A | Discharge status: Home / Self Care | Payer: Self-pay | Source: Home / Self Care | Attending: Emergency Medicine | Admitting: Emergency Medicine

## 2013-08-16 DIAGNOSIS — I889 Nonspecific lymphadenitis, unspecified: Secondary | ICD-10-CM

## 2013-08-16 DIAGNOSIS — J029 Acute pharyngitis, unspecified: Secondary | ICD-10-CM

## 2013-08-16 LAB — POCT RAPID STREP A: Streptococcus, Group A Screen (Direct): NEGATIVE

## 2013-08-16 MED ORDER — CLINDAMYCIN HCL 300 MG PO CAPS: 300.0000 mg | ORAL_CAPSULE | Freq: Three times a day (TID) | ORAL | Status: DC

## 2013-08-16 MED ORDER — METHYLPREDNISOLONE 4 MG PO KIT
PACK | ORAL | Status: DC
Start: 2013-08-16 — End: 2013-09-28

## 2013-08-16 MED ORDER — TRAMADOL HCL 50 MG PO TABS: 50.0000 mg | ORAL_TABLET | Freq: Four times a day (QID) | ORAL | Status: DC | PRN

## 2013-08-16 NOTE — ED Notes (Signed)
C/o pain and swelling R neck and hard to swallow onset 2 days ago.  Pain worse tonight.  She had a knot there 1 month ago and got antibiotic. It went away but its come back.

## 2013-08-16 NOTE — Discharge Instructions (Signed)
Swollen Lymph Nodes The lymphatic system filters fluid from around cells. It is like a system of blood vessels. These channels carry lymph instead of blood. The lymphatic system is an important part of the immune (disease fighting) system. When people talk about "swollen glands in the neck," they are usually talking about swollen lymph nodes. The lymph nodes are like the little traps for infection. You and your caregiver may be able to feel lymph nodes, especially swollen nodes, in these common areas: the groin (inguinal area), armpits (axilla), and above the clavicle (supraclavicular). You may also feel them in the neck (cervical) and the back of the head just above the hairline (occipital). Swollen glands occur when there is any condition in which the body responds with an allergic type of reaction. For instance, the glands in the neck can become swollen from insect bites or any type of minor infection on the head. These are very noticeable in children with only minor problems. Lymph nodes may also become swollen when there is a tumor or problem with the lymphatic system, such as Hodgkin's disease. TREATMENT   Most swollen glands do not require treatment. They can be observed (watched) for a short period of time, if your caregiver feels it is necessary. Most of the time, observation is not necessary.  Antibiotics (medicines that kill germs) may be prescribed by your caregiver. Your caregiver may prescribe these if he or she feels the swollen glands are due to a bacterial (germ) infection. Antibiotics are not used if the swollen glands are caused by a virus. HOME CARE INSTRUCTIONS   Take medications as directed by your caregiver. Only take over-the-counter or prescription medicines for pain, discomfort, or fever as directed by your caregiver. SEEK MEDICAL CARE IF:   If you begin to run a temperature greater than 102 F (38.9 C), or as your caregiver suggests. MAKE SURE YOU:   Understand these  instructions.  Will watch your condition.  Will get help right away if you are not doing well or get worse. Document Released: 05/21/2002 Document Revised: 08/23/2011 Document Reviewed: 05/31/2005 [facility] Patient Information 2014 [place], [place].  Pharyngitis Pharyngitis is redness, pain, and swelling (inflammation) of your pharynx.  CAUSES  Pharyngitis is usually caused by infection. Most of the time, these infections are from viruses (viral) and are part of a cold. However, sometimes pharyngitis is caused by bacteria (bacterial). Pharyngitis can also be caused by allergies. Viral pharyngitis may be spread from person to person by coughing, sneezing, and personal items or utensils (cups, forks, spoons, toothbrushes). Bacterial pharyngitis may be spread from person to person by more intimate contact, such as kissing.  SIGNS AND SYMPTOMS  Symptoms of pharyngitis include:   Sore throat.   Tiredness (fatigue).   Low-grade fever.   Headache.  Joint pain and muscle aches.  Skin rashes.  Swollen lymph nodes.  Plaque-like film on throat or tonsils (often seen with bacterial pharyngitis). DIAGNOSIS  Your health care provider will ask you questions about your illness and your symptoms. Your medical history, along with a physical exam, is often all that is needed to diagnose pharyngitis. Sometimes, a rapid strep test is done. Other lab tests may also be done, depending on the suspected cause.  TREATMENT  Viral pharyngitis will usually get better in 3 4 days without the use of medicine. Bacterial pharyngitis is treated with medicines that kill germs (antibiotics).  HOME CARE INSTRUCTIONS   Drink enough water and fluids to keep your urine clear or pale  yellow.   Only take over-the-counter or prescription medicines as directed by your health care provider:   If you are prescribed antibiotics, make sure you finish them even if you start to feel better.   Do not take aspirin.    Get lots of rest.   Gargle with 8 oz of salt water ( tsp of salt per 1 qt of water) as often as every 1 2 hours to soothe your throat.   Throat lozenges (if you are not at risk for choking) or sprays may be used to soothe your throat. SEEK MEDICAL CARE IF:   You have large, tender lumps in your neck.  You have a rash.  You cough up green, yellow-brown, or bloody spit. SEEK IMMEDIATE MEDICAL CARE IF:   Your neck becomes stiff.  You drool or are unable to swallow liquids.  You vomit or are unable to keep medicines or liquids down.  You have severe pain that does not go away with the use of recommended medicines.  You have trouble breathing (not caused by a stuffy nose). MAKE SURE YOU:   Understand these instructions.  Will watch your condition.  Will get help right away if you are not doing well or get worse. Document Released: 05/31/2005 Document Revised: 03/21/2013 Document Reviewed: 02/05/2013 [facility] Patient Information 2014 [place].

## 2013-08-16 NOTE — ED Provider Notes (Signed)
CSN: [id number]     Arrival date & time 08/16/13  1855 History   First MD Initiated Contact with Patient 08/16/13 2003     Chief Complaint  Patient presents with  . Neck Pain   (Consider location/radiation/quality/duration/timing/severity/associated sxs/prior Treatment) HPI Comments: 33 year old female presents for evaluation of swelling in her right neck, sore throat, and trouble swallowing. Her symptoms first began 2 days ago and have been getting progressively worse. She says this is similar to when she was seen previously and diagnosed with sialadenitis, but the swelling is in a slightly different spot this time. She says the last time she did not have sore throat either. She denies any symptoms systemic symptoms and does not feel ill at this time. She denies fever, chills, NVD. She is having some difficulty swallowing due to the pain.  Patient is a 33 y.o. female presenting with neck pain.  Neck Pain Associated symptoms: no chest pain, no fever and no weakness     Past Medical History  Diagnosis Date  . Depression   . CTS (carpal tunnel syndrome) 09/2003  . Asthma     as a child   Past Surgical History  Procedure Laterality Date  . Cholecystectomy      Gallstones removed-Around 33 years of age   Family History  Problem Relation Age of Onset  . Hypertension Mother   . Diabetes Mother   . Hypertension Maternal Grandmother   . Diabetes Maternal Grandmother   . Acute myelogenous leukemia Neg Hx    History  Substance Use Topics  . Smoking status: Never Smoker   . Smokeless tobacco: Never Used  . Alcohol Use: No   OB History   Grav Para Term Preterm Abortions TAB SAB Ect Mult Living                 Review of Systems  Constitutional: Negative for fever and chills.  HENT: Positive for sore throat. Negative for congestion, ear pain, postnasal drip, rhinorrhea and sinus pressure.   Eyes: Negative for visual disturbance.  Respiratory: Negative for cough and shortness of  breath.   Cardiovascular: Negative for chest pain, palpitations and leg swelling.  Gastrointestinal: Negative for nausea, vomiting and abdominal pain.  Endocrine: Negative for polydipsia and polyuria.  Genitourinary: Negative for dysuria, urgency and frequency.  Musculoskeletal: Positive for neck pain. Negative for arthralgias and myalgias.  Skin: Negative for rash.  Neurological: Negative for dizziness, weakness and light-headedness.  Hematological: Positive for adenopathy.    Allergies  Review of patient's allergies indicates no known allergies.  Home Medications   Current Outpatient Rx  Name  Route  Sig  Dispense  Refill  . amoxicillin (AMOXIL) 875 MG tablet   Oral   Take 1 tablet (875 mg total) by mouth 2 (two) times daily.   14 tablet   0   . clindamycin (CLEOCIN) 300 MG capsule   Oral   Take 1 capsule (300 mg total) by mouth 3 (three) times daily.   21 capsule   0   . clindamycin (CLEOCIN) 300 MG capsule   Oral   Take 1 capsule (300 mg total) by mouth 3 (three) times daily.   30 capsule   0   . medroxyPROGESTERone (DEPO-PROVERA) 150 MG/ML injection   Intramuscular   Inject 1 mL (150 mg total) into the muscle every 3 (three) months. Per protocol.   1 mL   0   . methylPREDNISolone (MEDROL DOSEPAK) 4 MG tablet  follow package directions   21 tablet   0     Dispense as written.   . traMADol (ULTRAM) 50 MG tablet   Oral   Take 1 tablet (50 mg total) by mouth every 6 (six) hours as needed.   15 tablet   0    BP 132/86  Pulse 74  Temp(Src) 99 F (37.2 C) (Oral)  Resp 20  SpO2 100% Physical Exam  Nursing note and vitals reviewed. Constitutional: She is oriented to person, place, and time. Vital signs are normal. She appears well-developed and well-nourished. No distress.  HENT:  Head: Normocephalic and atraumatic.  Nose: Nose normal. Right sinus exhibits no maxillary sinus tenderness and no frontal sinus tenderness. Left sinus exhibits no  maxillary sinus tenderness and no frontal sinus tenderness.  Mouth/Throat: Uvula is midline and mucous membranes are normal. No uvula swelling. Oropharyngeal exudate and posterior oropharyngeal erythema present. No posterior oropharyngeal edema or tonsillar abscesses.  Neck: Normal range of motion. Neck supple.  Cardiovascular: Normal rate, regular rhythm and normal heart sounds.  Exam reveals no gallop and no friction rub.   No murmur heard. Pulmonary/Chest: Effort normal and breath sounds normal. No respiratory distress. She has no wheezes. She has no rales.  Lymphadenopathy:       Head (right side): Tonsillar adenopathy present. No submandibular adenopathy present.       Head (left side): No submandibular and no tonsillar adenopathy present.    She has no cervical adenopathy.  Neurological: She is alert and oriented to person, place, and time. She has normal strength. Coordination normal.  Skin: Skin is warm and dry. No rash noted. She is not diaphoretic.  Psychiatric: She has a normal mood and affect. Judgment normal.    ED Course  Procedures (including critical care time) Labs Review Labs Reviewed  CULTURE, GROUP A STREP  POCT RAPID STREP A ([facility] CARE ONLY)   Imaging Review No results found.   MDM   1. Exudative pharyngitis   2. Lymphadenitis    Exudative pharyngitis, rapid strep is negative. Culture sent. Treating with antibiotics due to rapid progression of painful symptoms. Followup when necessary if not improving. If acutely worsening, but the emergency room  Meds ordered this encounter  Medications  . clindamycin (CLEOCIN) 300 MG capsule    Sig: Take 1 capsule (300 mg total) by mouth 3 (three) times daily.    Dispense:  30 capsule    Refill:  0    Order Specific Question:  Supervising Provider    Answer:  Clementeen GrahamOREY, EVAN, S [id number]]  . methylPREDNISolone (MEDROL DOSEPAK) 4 MG tablet    Sig: follow package directions    Dispense:  21 tablet    Refill:  0    Order  Specific Question:  Supervising Provider    Answer:  Clementeen GrahamOREY, EVAN, S [[id number]]  . traMADol (ULTRAM) 50 MG tablet    Sig: Take 1 tablet (50 mg total) by mouth every 6 (six) hours as needed.    Dispense:  15 tablet    Refill:  0    Order Specific Question:  Supervising Provider    Answer:  Clementeen GrahamOREY, EVAN, Kathie RhodesS [[id number]]       Graylon GoodZachary H Merlen Gurry, PA-C 08/16/13 2120

## 2013-08-16 NOTE — ED Provider Notes (Signed)
Medical screening examination/treatment/procedure(s) were performed by non-physician practitioner and as supervising physician I was immediately available for consultation/collaboration.  Talik Casique, M.D.  Alper Guilmette C Desma Wilkowski, MD 08/16/13 2206 

## 2013-08-18 LAB — CULTURE, GROUP A STREP

## 2013-08-19 ENCOUNTER — Telehealth (HOSPITAL_COMMUNITY): Payer: Self-pay | Admitting: *Deleted

## 2013-08-19 NOTE — Progress Notes (Signed)
Quick Note:  Results are abnormal as noted, but have been adequately treated. No further action necessary. ______ 

## 2013-08-19 NOTE — ED Notes (Signed)
Throat culture: Group A Strep ( S. Pyogenes).  Pt. adequately treated with Cleocin.  I called pt. Pt. verified x 2 and given results. Pt. told she was adequately treated, to finish all of antibiotics and come back if not better with medication.  If anyone she exposed gets the same symptoms, should get checked for strep as well. Vassie MoselleYork, Adalaide Jaskolski M 08/19/2013

## 2013-09-25 ENCOUNTER — Ambulatory Visit: Payer: Self-pay | Admitting: Family Medicine

## 2013-09-28 ENCOUNTER — Ambulatory Visit (INDEPENDENT_AMBULATORY_CARE_PROVIDER_SITE_OTHER): Payer: No Typology Code available for payment source | Admitting: Family Medicine

## 2013-09-28 ENCOUNTER — Encounter: Payer: Self-pay | Admitting: Family Medicine

## 2013-09-28 ENCOUNTER — Ambulatory Visit: Payer: No Typology Code available for payment source

## 2013-09-28 VITALS — BP 120/75 | HR 88 | Temp 98.3°F | Ht 62.0 in | Wt 195.0 lb

## 2013-09-28 DIAGNOSIS — R51 Headache: Secondary | ICD-10-CM

## 2013-09-28 DIAGNOSIS — R519 Headache, unspecified: Secondary | ICD-10-CM

## 2013-09-28 MED ORDER — DEXAMETHASONE SODIUM PHOSPHATE 10 MG/ML IJ SOLN
10.0000 mg | Freq: Once | INTRAMUSCULAR | Status: AC
  Administered 2013-09-28: 10 mg via INTRAMUSCULAR

## 2013-09-28 MED ORDER — KETOROLAC TROMETHAMINE 30 MG/ML IJ SOLN
30.0000 mg | Freq: Once | INTRAMUSCULAR | Status: AC
  Administered 2013-09-28: 30 mg via INTRAMUSCULAR

## 2013-09-28 NOTE — Patient Instructions (Signed)
Go home and get lots of rest today. Try to avoid taking ibuprofen/tylenol or any other over the counter for at least 3 days to break the cycle of the rebound headache. Hopefully the 2 medicines today will knock out your headache.   Tension Headache A tension headache is a feeling of pain, pressure, or aching often felt over the front and sides of the head. The pain can be dull or can feel tight (constricting). It is the most common type of headache. Tension headaches are not normally associated with nausea or vomiting and do not get worse with physical activity. Tension headaches can last 30 minutes to several days.  CAUSES  The exact cause is not known, but it may be caused by chemicals and hormones in the brain that lead to pain. Tension headaches often begin after stress, anxiety, or depression. Other triggers may include:  Alcohol.  Caffeine (too much or withdrawal).  Respiratory infections (colds, flu, sinus infections).  Dental problems or teeth clenching.  Fatigue.  Holding your head and neck in one position too long while using a computer. SYMPTOMS   Pressure around the head.   Dull, aching head pain.   Pain felt over the front and sides of the head.   Tenderness in the muscles of the head, neck, and shoulders. DIAGNOSIS  A tension headache is often diagnosed based on:   Symptoms.   Physical examination.   A CT scan or MRI of your head. These tests may be ordered if symptoms are severe or unusual. TREATMENT  Medicines may be given to help relieve symptoms.  HOME CARE INSTRUCTIONS   Only take over-the-counter or prescription medicines for pain or discomfort as directed by your caregiver.   Lie down in a dark, quiet room when you have a headache.   Keep a journal to find out what may be triggering your headaches. For example, write down:  What you eat and drink.  How much sleep you get.  Any change to your diet or medicines.  Try massage or other  relaxation techniques.   Ice packs or heat applied to the head and neck can be used. Use these 3 to 4 times per day for 15 to 20 minutes each time, or as needed.   Limit stress.   Sit up straight, and do not tense your muscles.   Quit smoking if you smoke.  Limit alcohol use.  Decrease the amount of caffeine you drink, or stop drinking caffeine.  Eat and exercise regularly.  Get 7 to 9 hours of sleep, or as recommended by your caregiver.  Avoid excessive use of pain medicine as recurrent headaches can occur.  SEEK MEDICAL CARE IF:   You have problems with the medicines you were prescribed.  Your medicines do not work.  You have a change from the usual headache.  You have nausea or vomiting. SEEK IMMEDIATE MEDICAL CARE IF:   Your headache becomes severe.  You have a fever.  You have a stiff neck.  You have loss of vision.  You have muscular weakness or loss of muscle control.  You lose your balance or have trouble walking.  You feel faint or pass out.  You have severe symptoms that are different from your first symptoms. MAKE SURE YOU:   Understand these instructions.  Will watch your condition.  Will get help right away if you are not doing well or get worse. Document Released: 05/31/2005 Document Revised: 08/23/2011 Document Reviewed: 05/21/2011 Exit[employer] Patient Information 2014  [employer].  Headaches, Analgesic Rebound Analgesic agents are prescription or over-the-counter medications used to control pain, including headaches. However, overuse of theses medications is common and can lead to rebound headaches. Rebound headaches are headaches that recur after the analgesic medication wears off. Eventually, the rebound headaches can become longlasting (chronic). If this happens, you must completely stop using analgesic medications. If not, the chronic headache is likely to continue despite the use of any other treatment. Usually when you stop taking  analgesic medications, the headache may initally get worse for several days. Along with this you may experience sickness in your stomach (nausea), and you may throw up (vomit). After a period of 3 to 5 days, these symptoms begin to improve. Sometimes improvement may take longer. Eventually, the headaches will slowly improve with treatment with the right medications. Most people are able to stop using analgesic medications at home with a caregiver's supervision. But some find it difficult and may require hospitalization. Document Released: 08/21/2003 Document Revised: 08/23/2011 Document Reviewed: 12/14/2012 [facility] Patient Information 2014 [employer], [place].

## 2013-09-28 NOTE — Assessment & Plan Note (Addendum)
Normal neurologic exam and no red flags. Current headache likely started as tension headache and then developed into rebound headaches from analgesic use. Have advised avoid OTC meds for next 3 days, will treat with toradol x 1 for acute relief and dexadron 10mg  for hopefully more prolonged relief. 3/5 of POUND (no nausea and not unilateral) so possible migraine but will hold off on triptans for now given possibility of tension/rebound HA.

## 2013-09-28 NOTE — Progress Notes (Signed)
  Tana ConchStephen Hunter, MD Phone: [phone]  Subjective:   Erin Osborn is a 33 y.o. year old very pleasant female patient who presents with the following:  Headache Location-diffuse Quality-throbbing Severity-10/10, similar to pain experienced in 2012 when seen for chronic headaches Duration-x 2 weeks Timing- constant during last 2 weeks but will fluctuate from 4/10 to 10/10 Context- no triggering factor, did note some stress, and neck tightness before this started Modifying factors- better with excedrin extra strenght and laying down, other OTC meds have not helped, but has been taking them around the clock and pain flares several hours out from each use of analgesic Associated symptoms- neck tightness as above, mild photophobia. States unable to do normal activities due to severity of pain  ROS- no nausea/vomiting. No fevers/chills. Pain with pushing on neck muscles.   Past Medical History- history sinusitis, headaches, allergic rhinitis, obesity, GERd, depression Medications- no medications, last depo shot finished in february   Objective: BP 120/75  Pulse 88  Temp(Src) 98.3 F (36.8 C) (Oral)  Ht 5\' 2"  (1.575 m)  Wt 195 lb (88.451 kg)  BMI 35.66 kg/m2 Gen: appears uncomfortable due to headache HEENT: PERRLA.  Neck: supple, but pain when pushing in muscles of upper back of neck CV: RRR no mrg  Lungs: CTAB  Abd: soft/nontender/nondistended/normal bowel sounds  MSK: moves all extremities, no edema  Skin: warm and dry, no rash  Neuro: CN II-XII intact, sensation and reflexes normal throughout, 5/5 muscle strength in bilateral upper and lower extremities. Normal finger to nose. Normal rapid alternating movements. Normal heel to shin.  Assessment/Plan:  Chronic headaches Normal neurologic exam and no red flags. Current headache likely started as tension headache and then developed into rebound headaches from analgesic use. Have advised avoid OTC meds for next 3 days, will  treat with toradol x 1 for acute relief and dexadron 10mg  for hopefully more prolonged relief. 3/5 of POUND (no nausea and not unilateral) so possible migraine but will hold off on triptans for now given possibility of tension/rebound HA.    Meds ordered this encounter  Medications  . dexamethasone (DECADRON) injection 10 mg    Sig:   . ketorolac (TORADOL) 30 MG/ML injection 30 mg    Sig:

## 2013-10-11 ENCOUNTER — Telehealth: Payer: Self-pay | Admitting: Family Medicine

## 2013-10-11 NOTE — Telephone Encounter (Signed)
Pt notified to please make an appt with PCP per Dr. Durene CalHunter.  Radene OuKristen L Dezarae Mcclaran, CMA

## 2013-10-11 NOTE — Telephone Encounter (Signed)
Will FWD message to Dr. Durene CalHunter and patient's PCP.  Radene OuKristen L Javonne Louissaint, CMA

## 2013-10-11 NOTE — Telephone Encounter (Signed)
I advised patient may need something else. I would ask patient to schedule patient with PCP for follow up of this. It wasn't clear what type of headaches these were (tension vs. Medication rebound). Need further history and more than can be obtained by phone.

## 2013-10-11 NOTE — Telephone Encounter (Signed)
Pt called because when she was seen last week by Dr. Durene CalHunter ha gave her two shots for her headaches. He told her that if the headaches came back to call and he would send in something for her. The headaches are back and would like something called in. jw

## 2013-10-12 ENCOUNTER — Ambulatory Visit (INDEPENDENT_AMBULATORY_CARE_PROVIDER_SITE_OTHER): Payer: No Typology Code available for payment source | Admitting: Family Medicine

## 2013-10-12 ENCOUNTER — Encounter: Payer: Self-pay | Admitting: Family Medicine

## 2013-10-12 VITALS — BP 116/82 | HR 68 | Temp 98.2°F | Ht 62.0 in | Wt 198.5 lb

## 2013-10-12 DIAGNOSIS — R51 Headache: Secondary | ICD-10-CM

## 2013-10-12 DIAGNOSIS — G8929 Other chronic pain: Secondary | ICD-10-CM

## 2013-10-12 MED ORDER — NAPROXEN 500 MG PO TABS
500.0000 mg | ORAL_TABLET | Freq: Two times a day (BID) | ORAL | Status: DC
Start: 2013-10-12 — End: 2013-10-21

## 2013-10-12 MED ORDER — SUMATRIPTAN SUCCINATE 25 MG PO TABS
25.0000 mg | ORAL_TABLET | ORAL | Status: DC | PRN
Start: 2013-10-12 — End: 2014-09-11

## 2013-10-12 MED ORDER — KETOROLAC TROMETHAMINE 60 MG/2ML IM SOLN
60.0000 mg | Freq: Once | INTRAMUSCULAR | Status: AC
  Administered 2013-10-12: 60 mg via INTRAMUSCULAR

## 2013-10-12 MED ORDER — DEXAMETHASONE SODIUM PHOSPHATE 10 MG/ML IJ SOLN
10.0000 mg | Freq: Once | INTRAMUSCULAR | Status: AC
  Administered 2013-10-12: 10 mg via INTRAMUSCULAR

## 2013-10-12 NOTE — Progress Notes (Signed)
Patient ID: Erin Osborn, female   DOB: 07-08-1980, 33 y.o.   MRN: [id number]    Subjective: HPI: Patient is a 33 y.o. female presenting to clinic today for follow up headache.  Headache Patient presents for evaluation of headache. Symptoms began about a few months ago. Generally, the headaches last about all day and occur every day. The headaches are usually poorly described and are located in entire head.  The patient rates her most severe headaches a 8 on a scale from 1 to 10. Recently, the headaches have been increasing in severity. Work attendance or other daily activities are affected by the headaches. Precipitating factors include: none which have been determined. The headaches are usually not preceded by an aura. Associated neurologic symptoms: None. The patient denies muscle weakness, numbness of extremities, vision problems and vomiting in the early morning. Home treatment has included acetaminophen and ibuprofen with little improvement. Other history includes: nothing pertinent. Family history includes no known family members with significant headaches.  Patient states that she got Toradol and Decadron shots last week which helped a few days, but headache returned. She reports increased stress lately.  History Reviewed: Non-smoker. Health Maintenance: UTD  ROS: Please see HPI above.  Objective: Office vital signs reviewed. BP 116/82  Pulse 68  Temp(Src) 98.2 F (36.8 C) (Oral)  Ht 5\' 2"  (1.575 m)  Wt 198 lb 8 oz (90.039 kg)  BMI 36.30 kg/m2  Physical Examination:  General: Awake, alert. NAD HEENT: Atraumatic, normocephalic. MMM. Pupils equal and reactive. EOMI, TM wnl Neck: No masses palpated. No LAD Pulm: CTAB, no wheezes Cardio: RRR, no murmurs appreciated Neuro: CN grossly intact, normal strength and sensation  Assessment: 33 y.o. female with headache  Plan: See Problem List and After Visit Summary

## 2013-10-12 NOTE — Patient Instructions (Signed)
I am sorry you are still having headaches.  We will try the shots again today.  Take the Naprosyn 500mg  twice daily rather than the over the counter medications.  Use the Imitrex ONLY if your headache gets severe. Make sure you can rest when you take it.  Follow up with Dr. Casper HarrisonStreet.

## 2013-10-13 NOTE — Assessment & Plan Note (Signed)
A: Unsure if this is tension, migraine or medication rebound headahce but it appears to interfere with her daily life. No red flags on exam.  P: - Decadron and Toradol given - Avoid OTC medication, take Naprosyn 500mg  BID only - Imitrex if has persistent headache to break it. - F/u with PCP for further evaluation

## 2013-10-21 ENCOUNTER — Encounter (HOSPITAL_COMMUNITY): Payer: Self-pay | Admitting: Emergency Medicine

## 2013-10-21 ENCOUNTER — Emergency Department (HOSPITAL_COMMUNITY)
Admission: EM | Admit: 2013-10-21 | Discharge: 2013-10-21 | Disposition: A | Discharge status: Home / Self Care | Payer: No Typology Code available for payment source | Attending: Emergency Medicine | Admitting: Emergency Medicine

## 2013-10-21 DIAGNOSIS — Z8659 Personal history of other mental and behavioral disorders: Secondary | ICD-10-CM | POA: Insufficient documentation

## 2013-10-21 DIAGNOSIS — K089 Disorder of teeth and supporting structures, unspecified: Secondary | ICD-10-CM | POA: Insufficient documentation

## 2013-10-21 DIAGNOSIS — J45909 Unspecified asthma, uncomplicated: Secondary | ICD-10-CM | POA: Insufficient documentation

## 2013-10-21 DIAGNOSIS — Z8669 Personal history of other diseases of the nervous system and sense organs: Secondary | ICD-10-CM | POA: Insufficient documentation

## 2013-10-21 DIAGNOSIS — Z792 Long term (current) use of antibiotics: Secondary | ICD-10-CM | POA: Insufficient documentation

## 2013-10-21 DIAGNOSIS — Z791 Long term (current) use of non-steroidal anti-inflammatories (NSAID): Secondary | ICD-10-CM | POA: Insufficient documentation

## 2013-10-21 DIAGNOSIS — K0889 Other specified disorders of teeth and supporting structures: Secondary | ICD-10-CM

## 2013-10-21 MED ORDER — PENICILLIN V POTASSIUM 500 MG PO TABS: 500.0000 mg | ORAL_TABLET | Freq: Four times a day (QID) | ORAL | Status: DC

## 2013-10-21 MED ORDER — TRAMADOL HCL 50 MG PO TABS: 50.0000 mg | ORAL_TABLET | Freq: Four times a day (QID) | ORAL | Status: DC | PRN

## 2013-10-21 MED ORDER — NAPROXEN 500 MG PO TABS: 500.0000 mg | ORAL_TABLET | Freq: Two times a day (BID) | ORAL | Status: DC

## 2013-10-21 NOTE — Discharge Instructions (Signed)
Take penicillin to cover for infection. Recommend naproxen for dental pain. You may take tramadol as prescribed if your pain becomes severe. Followup with a dentist.  Dental Pain A tooth ache may be caused by cavities (tooth decay). Cavities expose the nerve of the tooth to air and hot or cold temperatures. It may come from an infection or abscess (also called a boil or furuncle) around your tooth. It is also often caused by dental caries (tooth decay). This causes the pain you are having. DIAGNOSIS  Your caregiver can diagnose this problem by exam. TREATMENT   If caused by an infection, it may be treated with medications which kill germs (antibiotics) and pain medications as prescribed by your caregiver. Take medications as directed.  Only take over-the-counter or prescription medicines for pain, discomfort, or fever as directed by your caregiver.  Whether the tooth ache today is caused by infection or dental disease, you should see your dentist as soon as possible for further care. SEEK MEDICAL CARE IF: The exam and treatment you received today has been provided on an emergency basis only. This is not a substitute for complete medical or dental care. If your problem worsens or new problems (symptoms) appear, and you are unable to meet with your dentist, call or return to this location. SEEK IMMEDIATE MEDICAL CARE IF:   You have a fever.  You develop redness and swelling of your face, jaw, or neck.  You are unable to open your mouth.  You have severe pain uncontrolled by pain medicine. MAKE SURE YOU:   Understand these instructions.  Will watch your condition.  Will get help right away if you are not doing well or get worse. Document Released: 05/31/2005 Document Revised: 08/23/2011 Document Reviewed: 01/17/2008 [facility] Patient Information 2014 [place], [place].  Emergency Department Resource Guide 1) Find a Doctor and Pay Out of Pocket Although you won't have to find out who is  covered by your insurance plan, it is a good idea to ask around and get recommendations. You will then need to call the office and see if the doctor you have chosen will accept you as a new patient and what types of options they offer for patients who are self-pay. Some doctors offer discounts or will set up payment plans for their patients who do not have insurance, but you will need to ask so you aren't surprised when you get to your appointment.  2) Contact Your Local Health Department Not all health departments have doctors that can see patients for sick visits, but many do, so it is worth a call to see if yours does. If you don't know where your local health department is, you can check in your phone book. The CDC also has a tool to help you locate your state's health department, and many state websites also have listings of all of their local health departments.  3) Find a Walk-in Clinic If your illness is not likely to be very severe or complicated, you may want to try a walk in clinic. These are popping up all over the country in pharmacies, drugstores, and shopping centers. They're usually staffed by nurse practitioners or physician assistants that have been trained to treat common illnesses and complaints. They're usually fairly quick and inexpensive. However, if you have serious medical issues or chronic medical problems, these are probably not your best option.  No Primary Care Doctor: - Call Health Connect at  [phone] - they can help you locate a primary care doctor  that  accepts your insurance, provides certain services, etc. - Physician Referral Service- [phone]  Chronic Pain Problems: Organization         Address  Phone   Notes  [facility]  [phone] Patients need to be referred by their primary care doctor.   Medication Assistance: Organization         Address  Phone   Notes  [facility] Medication [facility] [address] [place] [place], [place] [address] [phone] --Must be a resident of [facility] -- Must have NO insurance coverage whatsoever (no Medicaid/ Medicare, etc.) -- The pt. MUST have a primary care doctor that directs their care regularly and follows them in the community   [facility]  [phone]   [employer]  [phone]    Agencies that provide inexpensive medical care: Organization         Address  Phone   Notes  [name]  [phone]   [name]    [phone]   [facility] [address] [place], [place] [address] [phone]   [facility] [address] [place] [place] [phone]   Planned Parenthood    [phone]   [facility]    [phone]   [facility] and [facility]  [address], [place] Phone:  [phone], Fax:  [phone] Hours of Operation:  9 am - 6 pm, M-F.  Also accepts Medicaid/Medicare and self-pay.  [facility]  [address], [place] Phone: [phone], Fax: [phone]. Hours of Operation:  8:30 am - 5:30 pm, M-F.  Also accepts Medicaid and self-pay.  [facility] [place] [address], [place] Phone: [phone]   [facility] [address] Natasha Bence [place], [place] [phone], Ext. [phone] Mondays & Thursdays: 7-9 AM.  First 15 patients are seen on a first come, first serve basis.    Medicaid-accepting [facility] Providers:  Organization         Address  Phone   Notes  [facility] [address], [place] [phone] Also accepts self-pay patients.  [facility] [address] Laurell Josephs [place], [place]  [phone]   [facility] [address], [place] [phone]   [facility] [address] [place] [phone]   Renaye Rakers [address], [place]   [phone] Only accepts [place] patients after they have their name applied to their card.   Self-Pay (no insurance) in [facility]:  Organization         Address  Phone   Notes  Sickle Cell Patients, [facility] [address] [place], [place] [phone]   [facility] [address] [place], [place] [phone]   [name] [place]  [address], [place] [phone]   [facility]/Dr. Osei-Bonsu  [address] [place] or [address], [place] [phone] Phone number for both [place] and [place] locations is the same.  Urgent Medical and [facility] [address], [place] [phone]   [facility] [address] [place] or [address] [phone]  [facility] [address], [place] [phone]) [phone], phone; [phone](336) [phone], fax Sees patients 1st and 3rd Saturday of every month.  Must not qualify for public or private insurance (i.e. Medicaid, Medicare, [place] Health Choice, Veterans' Benefits)  Household income should be no more than 200% of the poverty level The clinic cannot treat you if you are pregnant or think you are pregnant  Sexually transmitted diseases are not treated at the clinic.    Dental Care: Organization         Address  Phone  Notes  [facility] [address], [place] [phone]) [phone] Accepts children up to age 32 who are enrolled in [place]icaid or [place] Health Choice; pregnant women with a Medicaid card; and children who have applied for Medicaid or [place] Health Choice, but were declined, whose parents can pay a reduced fee at time of service.  [facility] [place]  [address], [place] [phone]) [phone] Accepts children up to age 32 who are enrolled  in [place] or [place] Health Choice; pregnant women with a Medicaid card; and children who have applied for Medicaid or [place] Health Choice, but were declined, whose parents can pay a reduced fee at time of service.  [facility]  [address], [place] [phone] Patients are seen by appointment only. Walk-ins are not accepted. [facility] will see patients 32 years of age and older. Monday - Tuesday (8am-5pm) Most Wednesdays (8:30-5pm) $30 per visit, cash only  [facility]  [address], [facility] [phone] Patients are seen by appointment only. Walk-ins are not accepted. [facility] will see patients 418 years of age and older. One Wednesday Evening (Monthly: Volunteer Based).  $30 per visit, cash only  [employer] [facility]  [phone] for adults; Children under age 244, call Graduate Pediatric Dentistry at [phone]) [phone]. Children aged 814-14, please call [phone] to request a pediatric application.  Dental services are provided in all areas of dental care including fillings, crowns and bridges, complete and partial dentures, implants, gum treatment, root canals, and extractions. Preventive care is also provided. Treatment is provided to both adults and children. Patients are selected via a lottery and there is often a waiting list.   [facility] [address], [place]  [phone] [email or web address]   [facility] [address], [place], [place] [phone], Ext. [phone] Second and Fourth Thursday of each month, opens at 6:30 AM; Clinic ends at 9 AM.  Patients are seen on a first-come first-served basis, and a limited number are seen during each clinic.   [facility]  [address], [place], [place] [phone]) [phone]   Eligibility Requirements You must have lived in [place], [place], or [place] counties for at least the last three months.   You cannot be  eligible for state or federal sponsored [place] Cityhealthcare insurance, including CIGNAVeterans Administration, [place]icaid, or [employer]Medicare.   You generally cannot be eligible for healthcare insurance through your employer.    How to apply: Eligibility screenings are held every Tuesday and Wednesday afternoon from 1:00 pm until 4:00 pm. You do not need an appointment for the interview!  [facility] [address], [place], [place] [phone]   [facility]  [phone]   [facility]  [phone]   [facility]  [phone]    Behavioral Health Resources in the Community: Intensive Outpatient Programs Organization         Address  Phone  Notes  [facility] [address], [place], [place] [phone]   [facility] [address], [place], [place] [phone]   [facility] [address], [place], [place]  [phone]   [facility] [address],  [place], [place] [phone] or [phone]   Substance Abuse Resources Organization         Address  Phone  Notes  [facility]  [phone]   [facility]  [phone]   [facility]  [phone]   [facility]  [phone]   Residential & Outpatient Substance Abuse Program  [phone]   Psychological Services Organization         Address  Phone  Notes  [facility]  [phone]- [phone]   [facility]  [phone]- [phone]   [facility] [address], [place] [phone] or [phone]    Mobile Crisis Teams Organization         Address  Phone  Notes  [facility], Mobile Crisis Care Unit  [phone]   [facility]  [address] [place], [place] [phone]   Doristine LocksSharon DeEsch [address] [place] [phone]    Self-Help/Support Groups Organization          Address  Phone             Notes  [facility] [place] - variety of support groups  [phone]- [phone] Call for more information  Narcotics Anonymous (NA), [facility] [address], [place]  2 meetings at this location   Statisticianesidential Treatment Programs Organization         Address  Phone  Notes  [facility] [address],    [place]  [phone]   [facility]  [address], [place] [phone], [place], [place] [phone]   [facility] [address], [place] [phone] Admissions: 8am-3pm M-F  [facility] [address]    [place], [place] [phone]   [facility] [address], [place], [place] [phone]   [facility] [address]  [place], [place] [phone]   [facility] - Intensive Outpatient [address], [place], [place] [phone]   [facility] ([facility] [address]  [place], [place] [phone] or [phone]   [facility] ([facility]) [address] [place], [place] [phone] Accepts Medicaid  [facility] [address]  [place] [phone] Substance Abuse/Addiction Treatment   [facility] Resources Organization         Address  Phone  Notes  [facility]  [phone]) [phone]   Angie FavaJulie Brannon, PhD [address], [address] [place], [place]   [phone](336) [phone] or [phone]) [phone]   [facility]   [address] [place], [place] [phone]) [phone]   [facility] [address], [place], [place] [phone]) [phone] Insurance/Medicaid/sponsorship through [facility]  [facility] [address]                                    [place], [place] [phone](336) [phone] Therapy/tele-psych/case  [facility]  [address]   [place], [place] [phone]) [phone]    Dr. Lolly MustacheArfeen  [phone](336) [phone]   [facility] [place]  [employer]  [facility] 1) [address], [place] 2) [address], [place] 3)  [address], [place] [phone]) [phone]  [phone]) [phone]   [facility] Child Abuse Hotline [phone]) [phone] or [phone](336) [phone] (After Hours)

## 2013-10-21 NOTE — ED Provider Notes (Signed)
CSN: [id number]     Arrival date & time 10/21/13  0800 History   First MD Initiated Contact with Patient 10/21/13 0801     No chief complaint on file.    (Consider location/radiation/quality/duration/timing/severity/associated sxs/prior Treatment) HPI Comments: 33 year old female presents for dentalgia which has been persistent for "weeks". Patient states that this morning she noticed a slight worsening in her pain. She states that pain is throbbing in nature and is intermittent, but has been constant since this morning. Pain is nonradiating and without alleviating factors; however, patient has not taken anything to attempt relief of her symptoms. She denies any aggravating factors as well as associated fever, pain or discharge or ear pain, inability to swallow, drooling, dental trauma, bleeding in the mouth, inability to open her jaw, and shortness of breath. She does not have a dentist.  The history is provided by the patient. No language interpreter was used.    Past Medical History  Diagnosis Date  . Depression   . CTS (carpal tunnel syndrome) 09/2003  . Asthma     as a child   Past Surgical History  Procedure Laterality Date  . Cholecystectomy      Gallstones removed-Around 33 years of age   Family History  Problem Relation Age of Onset  . Hypertension Mother   . Diabetes Mother   . Hypertension Maternal Grandmother   . Diabetes Maternal Grandmother   . Acute myelogenous leukemia Neg Hx    History  Substance Use Topics  . Smoking status: Never Smoker   . Smokeless tobacco: Never Used  . Alcohol Use: No   OB History   Grav Para Term Preterm Abortions TAB SAB Ect Mult Living                 Review of Systems  HENT: Positive for dental problem.   All other systems reviewed and are negative.     Allergies  Review of patient's allergies indicates no known allergies.  Home Medications   Prior to Admission medications   Medication Sig Start Date End Date Taking?  Authorizing Provider  naproxen (NAPROSYN) 500 MG tablet Take 1 tablet (500 mg total) by mouth 2 (two) times daily with a meal. 10/21/13   Antony MaduraKelly Macaria Bias, PA-C  penicillin v potassium (VEETID) 500 MG tablet Take 1 tablet (500 mg total) by mouth 4 (four) times daily. 10/21/13   Antony MaduraKelly Izaih Kataoka, PA-C  SUMAtriptan (IMITREX) 25 MG tablet Take 1 tablet (25 mg total) by mouth every 2 (two) hours as needed for migraine or headache. May repeat in 2 hours if headache persists or recurs. 10/12/13   Amber Nydia BoutonM [place], MD  traMADol (ULTRAM) 50 MG tablet Take 1 tablet (50 mg total) by mouth every 6 (six) hours as needed. 10/21/13   Antony MaduraKelly Mostyn Varnell, PA-C   BP 131/88  Pulse 73  Temp(Src) 98.6 F (37 C) (Oral)  Resp 18  Wt 193 lb (87.544 kg)  SpO2 100%  Physical Exam  Nursing note and vitals reviewed. Constitutional: She is oriented to person, place, and time. She appears well-developed and well-nourished. No distress.  HENT:  Head: Normocephalic and atraumatic.  Right Ear: Hearing, external ear and ear canal normal.  Left Ear: Hearing, external ear and ear canal normal.  Nose: Nose normal.  Mouth/Throat: Uvula is midline, oropharynx is clear and moist and mucous membranes are normal. No oral lesions. No trismus in the jaw. No dental abscesses or uvula swelling. No oropharyngeal exudate, posterior oropharyngeal edema or posterior oropharyngeal  erythema.    B/l cerumen impaction; unable to visualize TMs b/l. TTP of L lower 3rd molar which has not completely grown in/through gums. No surrounding swelling, erythema, or purulent drainage.  Eyes: Conjunctivae and EOM are normal. Pupils are equal, round, and reactive to light. No scleral icterus.  Neck: Normal range of motion. Neck supple.  Cardiovascular: Normal rate, regular rhythm and intact distal pulses.   Distal radial pulse 2+ in left upper extremity  Pulmonary/Chest: Effort normal. No stridor. No respiratory distress.  Musculoskeletal: Normal range of motion.    Neurological: She is alert and oriented to person, place, and time.  GCS 15. Speech is goal oriented. Patient moves extremities without ataxia.  Skin: Skin is warm and dry. No rash noted. She is not diaphoretic. No erythema. No pallor.  Psychiatric: She has a normal mood and affect. Her behavior is normal.    ED Course  Procedures (including critical care time) Labs Review Labs Reviewed - No data to display  Imaging Review No results found.   EKG Interpretation None      MDM   Final diagnoses:  Dentalgia   Patient with toothache x "weeks". No dental trauma. Uvula midline without evidence of PTA. No trismus or stridor. No gross abscess. Exam unconcerning for Ludwig's angina or spread of infection. Pain likely from emerging L lower 3rd molar. Will treat with penicillin to cover for infection as well as pain medicine. Urged patient to follow-up with dentist.     Filed Vitals:   10/21/13 0806  BP: 131/88  Pulse: 73  Temp: 98.6 F (37 C)  TempSrc: Oral  Resp: 18  Weight: 193 lb (87.544 kg)  SpO2: 100%      Antony MaduraKelly Geri Hepler, PA-C 10/21/13 [phone]

## 2013-10-22 NOTE — ED Provider Notes (Signed)
Medical screening examination/treatment/procedure(s) were performed by non-physician practitioner and as supervising physician I was immediately available for consultation/collaboration.   EKG Interpretation None        Shanna CiscoMegan E Docherty, MD 10/22/13 1614

## 2013-10-25 ENCOUNTER — Emergency Department (HOSPITAL_COMMUNITY)
Admission: EM | Admit: 2013-10-25 | Discharge: 2013-10-25 | Disposition: A | Discharge status: Home / Self Care | Payer: No Typology Code available for payment source | Attending: Emergency Medicine | Admitting: Emergency Medicine

## 2013-10-25 ENCOUNTER — Emergency Department (HOSPITAL_COMMUNITY): Payer: No Typology Code available for payment source

## 2013-10-25 ENCOUNTER — Encounter (HOSPITAL_COMMUNITY): Payer: Self-pay | Admitting: Emergency Medicine

## 2013-10-25 DIAGNOSIS — Z791 Long term (current) use of non-steroidal anti-inflammatories (NSAID): Secondary | ICD-10-CM | POA: Insufficient documentation

## 2013-10-25 DIAGNOSIS — Z792 Long term (current) use of antibiotics: Secondary | ICD-10-CM | POA: Insufficient documentation

## 2013-10-25 DIAGNOSIS — J45909 Unspecified asthma, uncomplicated: Secondary | ICD-10-CM | POA: Insufficient documentation

## 2013-10-25 DIAGNOSIS — Z3202 Encounter for pregnancy test, result negative: Secondary | ICD-10-CM | POA: Insufficient documentation

## 2013-10-25 DIAGNOSIS — F3289 Other specified depressive episodes: Secondary | ICD-10-CM | POA: Insufficient documentation

## 2013-10-25 DIAGNOSIS — Z8669 Personal history of other diseases of the nervous system and sense organs: Secondary | ICD-10-CM | POA: Insufficient documentation

## 2013-10-25 DIAGNOSIS — F329 Major depressive disorder, single episode, unspecified: Secondary | ICD-10-CM | POA: Insufficient documentation

## 2013-10-25 DIAGNOSIS — G43909 Migraine, unspecified, not intractable, without status migrainosus: Secondary | ICD-10-CM | POA: Insufficient documentation

## 2013-10-25 DIAGNOSIS — R519 Headache, unspecified: Secondary | ICD-10-CM | POA: Diagnosis present

## 2013-10-25 DIAGNOSIS — R51 Headache: Secondary | ICD-10-CM

## 2013-10-25 HISTORY — DX: Migraine, unspecified, not intractable, without status migrainosus: G43.909

## 2013-10-25 LAB — POC URINE PREG, ED: PREG TEST UR: NEGATIVE

## 2013-10-25 MED ORDER — SODIUM CHLORIDE 0.9 % IV BOLUS (SEPSIS)
1000.0000 mL | INTRAVENOUS | Status: AC
  Administered 2013-10-25: 1000 mL via INTRAVENOUS

## 2013-10-25 MED ORDER — DIPHENHYDRAMINE HCL 50 MG/ML IJ SOLN
25.0000 mg | Freq: Once | INTRAMUSCULAR | Status: AC
  Administered 2013-10-25: 25 mg via INTRAVENOUS
  Filled 2013-10-25: qty 1

## 2013-10-25 MED ORDER — DEXAMETHASONE SODIUM PHOSPHATE 10 MG/ML IJ SOLN
10.0000 mg | Freq: Once | INTRAMUSCULAR | Status: AC
  Administered 2013-10-25: 10 mg via INTRAVENOUS
  Filled 2013-10-25: qty 1

## 2013-10-25 MED ORDER — METOCLOPRAMIDE HCL 5 MG/ML IJ SOLN
5.0000 mg | Freq: Once | INTRAMUSCULAR | Status: AC
  Administered 2013-10-25: 5 mg via INTRAVENOUS
  Filled 2013-10-25: qty 2

## 2013-10-25 NOTE — ED Provider Notes (Signed)
CSN: [id number]     Arrival date & time 10/25/13  0714 History   First MD Initiated Contact with Patient 10/25/13 [phone]     Chief Complaint  Patient presents with  . Migraine     (Consider location/radiation/quality/duration/timing/severity/associated sxs/prior Treatment) Patient is a 33 y.o. female presenting with migraines. The history is provided by the patient.  Migraine This is a new problem. Episode onset: 2 mos ago. The problem occurs daily. The problem has been gradually worsening. Pertinent negatives include no chest pain, no abdominal pain, no headaches and no shortness of breath. Nothing aggravates the symptoms. Nothing relieves the symptoms. Treatments tried: imitrex, excedrin. The treatment provided mild relief.    Past Medical History  Diagnosis Date  . Depression   . CTS (carpal tunnel syndrome) 09/2003  . Asthma     as a child  . Migraines    Past Surgical History  Procedure Laterality Date  . Cholecystectomy      Gallstones removed-Around 33 years of age   Family History  Problem Relation Age of Onset  . Hypertension Mother   . Diabetes Mother   . Hypertension Maternal Grandmother   . Diabetes Maternal Grandmother   . Acute myelogenous leukemia Neg Hx    History  Substance Use Topics  . Smoking status: Never Smoker   . Smokeless tobacco: Never Used  . Alcohol Use: No   OB History   Grav Para Term Preterm Abortions TAB SAB Ect Mult Living                 Review of Systems  Constitutional: Negative for fever and fatigue.  HENT: Negative for congestion and drooling.   Eyes: Positive for photophobia. Negative for pain.  Respiratory: Negative for cough and shortness of breath.   Cardiovascular: Negative for chest pain.  Gastrointestinal: Negative for nausea, vomiting, abdominal pain and diarrhea.  Genitourinary: Negative for dysuria and hematuria.  Musculoskeletal: Negative for back pain, gait problem and neck pain.  Skin: Negative for color change.   Neurological: Negative for dizziness and headaches.  Hematological: Negative for adenopathy.  Psychiatric/Behavioral: Negative for behavioral problems.  All other systems reviewed and are negative.     Allergies  Review of patient's allergies indicates no known allergies.  Home Medications   Prior to Admission medications   Medication Sig Start Date End Date Taking? Authorizing Provider  naproxen (NAPROSYN) 500 MG tablet Take 1 tablet (500 mg total) by mouth 2 (two) times daily with a meal. 10/21/13   Antony MaduraKelly Humes, PA-C  penicillin v potassium (VEETID) 500 MG tablet Take 1 tablet (500 mg total) by mouth 4 (four) times daily. 10/21/13   Antony MaduraKelly Humes, PA-C  SUMAtriptan (IMITREX) 25 MG tablet Take 1 tablet (25 mg total) by mouth every 2 (two) hours as needed for migraine or headache. May repeat in 2 hours if headache persists or recurs. 10/12/13   Amber Nydia BoutonM Hairford, MD  traMADol (ULTRAM) 50 MG tablet Take 1 tablet (50 mg total) by mouth every 6 (six) hours as needed. 10/21/13   Antony MaduraKelly Humes, PA-C   BP 120/87  Pulse 82  Temp(Src) 98 F (36.7 C) (Oral)  Resp 20  Wt 195 lb (88.451 kg)  SpO2 100% Physical Exam  Nursing note and vitals reviewed. Constitutional: She is oriented to person, place, and time. She appears well-developed and well-nourished.  HENT:  Head: Normocephalic and atraumatic.  Nose: Nose normal.  Mouth/Throat: Oropharynx is clear and moist. No oropharyngeal exudate.  Eyes: Conjunctivae and  EOM are normal. Pupils are equal, round, and reactive to light.  Neck: Normal range of motion. Neck supple.  Cardiovascular: Normal rate, regular rhythm, normal heart sounds and intact distal pulses.  Exam reveals no gallop and no friction rub.   No murmur heard. Pulmonary/Chest: Effort normal and breath sounds normal. No respiratory distress. She has no wheezes.  Abdominal: Soft. Bowel sounds are normal. There is no tenderness. There is no rebound and no guarding.  Musculoskeletal:  Normal range of motion. She exhibits no edema and no tenderness.  Neurological: She is alert and oriented to person, place, and time.  alert, oriented x3 speech: normal in context and clarity memory: intact grossly cranial nerves II-XII: intact motor strength: full proximally and distally no involuntary movements or tremors sensation: intact to light touch diffusely  cerebellar: finger-to-nose and heel-to-shin intact gait: normal forwards and backwards, normal tandem gait   Skin: Skin is warm and dry.  Psychiatric: She has a normal mood and affect. Her behavior is normal.    ED Course  Procedures (including critical care time) Labs Review Labs Reviewed - No data to display  Imaging Review No results found.   EKG Interpretation None      MDM   Final diagnoses:  Headache    7:40 AM 33 y.o. female who presents with daily headaches which began approximately 2 months ago. She has taken over-the-counter medicines such as Excedrin without significant relief. She's been seen by family practice twice and started on Imitrex recently. She notes no significant relief with this medicine. She presents for ongoing headaches consistent with the pain she has had for the last 2 months. She denies any trauma or fevers. She is afebrile and vital signs are unremarkable here. She has a normal neurologic exam. Will get screening CT of head CT ongoing headaches and migraine cocktail.  9:15 AM: CT neg. HA now 3/10, pt feeling much better.  I have discussed the diagnosis/risks/treatment options with the patient and believe the pt to be eligible for discharge home to follow-up with her pcp w/in the next week if Ha's cont. We also discussed returning to the ED immediately if new or worsening sx occur. We discussed the sx which are most concerning (e.g., worsening HA, fever) that necessitate immediate return. Medications administered to the patient during their visit and any new prescriptions provided to the  patient are listed below.  Medications given during this visit Medications  metoCLOPramide (REGLAN) injection 5 mg (5 mg Intravenous Given 10/25/13 0742)  diphenhydrAMINE (BENADRYL) injection 25 mg (25 mg Intravenous Given 10/25/13 0742)  sodium chloride 0.9 % bolus 1,000 mL (0 mLs Intravenous Stopped 10/25/13 0845)  dexamethasone (DECADRON) injection 10 mg (10 mg Intravenous Given 10/25/13 0857)    New Prescriptions   No medications on file     Junius ArgyleForrest S Kurtiss Wence, MD 10/25/13 [phone]

## 2013-10-25 NOTE — ED Notes (Signed)
Pt states she has had a migraine for about two months now. She has been getting shots at her MD office- unsure of the name. Pt states these have not helped her migraine. She also has been taking imitrex and naprosen for the migraines with no relief. Pt has a hx of migraines. NO vision changes. No other neuro deficits

## 2013-10-25 NOTE — Discharge Instructions (Signed)

## 2013-11-15 ENCOUNTER — Encounter: Payer: Self-pay | Admitting: Family Medicine

## 2013-11-15 ENCOUNTER — Ambulatory Visit (INDEPENDENT_AMBULATORY_CARE_PROVIDER_SITE_OTHER): Payer: No Typology Code available for payment source | Admitting: Family Medicine

## 2013-11-15 VITALS — BP 130/71 | HR 67 | Temp 98.2°F | Ht 62.0 in | Wt 201.0 lb

## 2013-11-15 DIAGNOSIS — Z791 Long term (current) use of non-steroidal anti-inflammatories (NSAID): Secondary | ICD-10-CM

## 2013-11-15 DIAGNOSIS — R519 Headache, unspecified: Secondary | ICD-10-CM

## 2013-11-15 DIAGNOSIS — R51 Headache: Secondary | ICD-10-CM

## 2013-11-15 MED ORDER — KETOROLAC TROMETHAMINE 60 MG/2ML IM SOLN
60.0000 mg | Freq: Once | INTRAMUSCULAR | Status: AC
  Administered 2013-11-15: 60 mg via INTRAMUSCULAR

## 2013-11-15 MED ORDER — PROPRANOLOL HCL 40 MG PO TABS: 40.0000 mg | ORAL_TABLET | Freq: Two times a day (BID) | ORAL | Status: DC

## 2013-11-15 MED ORDER — DEXAMETHASONE SODIUM PHOSPHATE 10 MG/ML IJ SOLN
10.0000 mg | Freq: Once | INTRAMUSCULAR | Status: AC
Start: 2013-11-15 — End: 2013-11-15
  Administered 2013-11-15: 10 mg via INTRAMUSCULAR

## 2013-11-15 NOTE — Progress Notes (Signed)
   Subjective:    Patient ID: Erin Osborn, female    DOB: [birth year], 33 y.o.   MRN: [id number]  HPI: Pt presents to clinic for follow up of chronic headaches. Her symptoms started a few months ago. She has been to the ED and clinic several times; she has gotten various shots / medications, which help for a few hours then wear off. She was prescribed sumatriptan by the ED earlier this month (only 10 pills) but these did not help for long, either. Her headaches are worse in the mornings; she sleeps well and her headaches don't wake her up, but are present when she wakes up. She denies aura but does have photophobia and phonophobia. Her headaches are present daily. She does not take over-the-counter medications in order to avoid rebound headache. She endorses neck pain, eye pain, nausea with her headache. She denies numbness / tingling or weakness, dizziness, or balance changes.  Review of Systems: As above.     Objective:   Physical Exam BP 130/71  Pulse 67  Temp(Src) 98.2 F (36.8 C) (Oral)  Ht 5\' 2"  (1.575 m)  Wt 201 lb (91.173 kg)  BMI 36.75 kg/m2 Gen: well-appearing adult female in NAD HEENT: [place]/AT, EOMI, PERRLA, TM's clear Pulm: CTAB, no wheezes Cardio: RRR, no murmur Abd: soft, nontender, BS+ Ext: warm, well-perfused, no LE edema Neuro: CN II-XII intact, no gross focal deficit  Strength 5/5 throughout, sensation grossly intact     Assessment & Plan:

## 2013-11-15 NOTE — Assessment & Plan Note (Signed)
A: Overall history suggestive of migraines, but also could reflect back-to-back tension-type headaches. Very unlikely medication-overuse, and less likely cluster headaches given age and sex. No aura, but otherwise consistent with migraine. No neuro deficits to suggest other intracranial pathology.  P: Decadron and Toradol given again in clinic, today, to abort current headache; Imitrex not used given apparent ineffectiveness. BMP checked to look for kidney function given several recent doses of NSAIDs through clinic / ED. Rx given for propranolol to evaluate for effectiveness as headache prophylaxis. Advised pt she can SPARINGLY use Tylenol or ibuprofen as headache abortive medications, but to return to clinic if she is using them more than 5 days per month. Provided pt with headache clinic information; she is an orange card pt, so unsure if this is an option for her or not, due to cost. Otherwise instructed her to f/u as needed.

## 2013-11-15 NOTE — Patient Instructions (Signed)
Thank you for coming in, today!  We will give you some medicine for your headache today. I do want to check your kidney function with a blood test, as well, since we're giving you medicine that can affect your kidneys.  I will prescribe a medicine called propranolol to take daily to help prevent headaches. Propranolol itself does not treat headache. It's okay to take some Tylenol or ibuprofen a couple of times per week, if you need. If you're taking over-the-counter medicines more than 5 or so days per month, let me know.  The headache clinic number is [phone]. Give them a call to see if they are an option for you. If you can afford it or if they will see you but need a referral, call me and let me know.  Otherwise, you can come back to see me as you need. Please feel free to call with any questions or concerns at any time, at [phone]. --Dr. Casper Harrison

## 2013-11-16 LAB — BASIC METABOLIC PANEL
BUN: 9 mg/dL (ref 6–23)
CO2: 28 mEq/L (ref 19–32)
Calcium: 9.1 mg/dL (ref 8.4–10.5)
Chloride: 106 mEq/L (ref 96–112)
Creat: 0.6 mg/dL (ref 0.50–1.10)
Glucose, Bld: 81 mg/dL (ref 70–99)
POTASSIUM: 4.1 meq/L (ref 3.5–5.3)
SODIUM: 142 meq/L (ref 135–145)

## 2013-11-20 ENCOUNTER — Telehealth: Payer: Self-pay | Admitting: Family Medicine

## 2013-11-20 MED ORDER — AMITRIPTYLINE HCL 10 MG PO TABS: 10.0000 mg | ORAL_TABLET | Freq: Every day | ORAL | Status: DC

## 2013-11-20 NOTE — Telephone Encounter (Signed)
Patient states that her headaches is not getting any better and the medicines prescribed are not helping. States she has them everyday, wants MD to call her.

## 2013-11-20 NOTE — Telephone Encounter (Signed)
Called pt to discuss headaches. Propranolol plus Aleve is helping some, but pt still has severe, daily headaches. She cannot be seen at the headache clinic as she has no insurance. Her employer is recommending that she take a leave of absence, and she will bring in paperwork for this if she needs. Discussed various options with pt; Imitrex and tramadol have been ineffective and she does not want to take too much OTC medications as that made things worse in the past as well. Will try amitriptyline 10 mg qHS for 1-2 weeks, then consider increasing every 2 weeks or so if effective. Advised pt that this medicine may make her very sleepy, so we will start low and increase slowly, if needed. If not effective, will consider referral to neurology for further eval, though cost will be an issue. Pt voiced understanding. Paper Rx left at front desk. --CMS

## 2013-11-20 NOTE — Telephone Encounter (Signed)
Pt called and would like Dr. Casper Harrison to call her today. jw

## 2014-01-01 ENCOUNTER — Telehealth: Payer: Self-pay | Admitting: Family Medicine

## 2014-01-01 DIAGNOSIS — G44049 Chronic paroxysmal hemicrania, not intractable: Secondary | ICD-10-CM

## 2014-01-01 NOTE — Telephone Encounter (Signed)
Pt called and wants Dr. Casper HarrisonStreet to call her.jw

## 2014-01-01 NOTE — Telephone Encounter (Signed)
Called and spoke with Erin Osborn, she says her migraines her not getting any better with the medication that Dr Casper HarrisonStreet has her on, in fact they seem to be getting a little worse. They are causing her to lose sleep and making it difficult for her to function. According to Dr Timothy LassoStreet's note it looks like he is considering sending her to neurology. Forward to Dr Casper HarrisonStreet to see if he recommends a follow up with him or referral to neurology. Patient has the orange card so [facility] and be referred to them.Busick, Rodena Medinobert Lee

## 2014-01-01 NOTE — Telephone Encounter (Signed)
Neurology would be the next step, definitely. I will place the referral today and [facility]. Thanks. --CMS

## 2014-01-02 NOTE — Telephone Encounter (Signed)
Called patient and gave her the [facility] and instructed her to call them and apply for their financial assistance program for a referral to neurology. Also faxed the [facility] request [facility].Busick, Rodena Medinobert Lee

## 2014-01-21 ENCOUNTER — Ambulatory Visit (INDEPENDENT_AMBULATORY_CARE_PROVIDER_SITE_OTHER): Payer: No Typology Code available for payment source | Admitting: Family Medicine

## 2014-01-21 ENCOUNTER — Encounter: Payer: Self-pay | Admitting: Family Medicine

## 2014-01-21 VITALS — BP 122/86 | HR 68 | Temp 98.2°F | Ht 62.0 in | Wt 200.3 lb

## 2014-01-21 DIAGNOSIS — G8929 Other chronic pain: Secondary | ICD-10-CM

## 2014-01-21 DIAGNOSIS — R51 Headache: Secondary | ICD-10-CM

## 2014-01-21 MED ORDER — TOPIRAMATE 50 MG PO TABS: ORAL_TABLET | ORAL | Status: DC

## 2014-01-21 MED ORDER — SUMATRIPTAN SUCCINATE 50 MG PO TABS: 50.0000 mg | ORAL_TABLET | Freq: Once | ORAL | Status: DC

## 2014-01-21 NOTE — Patient Instructions (Signed)
Come back in 4 weeks to see your pcp  Migraine Headache A migraine headache is an intense, throbbing pain on one or both sides of your head. A migraine can last for 30 minutes to several hours. CAUSES  The exact cause of a migraine headache is not always known. However, a migraine may be caused when nerves in the brain become irritated and release chemicals that cause inflammation. This causes pain. Certain things may also trigger migraines, such as:  Alcohol.  Smoking.  Stress.  Menstruation.  Aged cheeses.  Foods or drinks that contain nitrates, glutamate, aspartame, or tyramine.  Lack of sleep.  Chocolate.  Caffeine.  Hunger.  Physical exertion.  Fatigue.  Medicines used to treat chest pain (nitroglycerine), birth control pills, estrogen, and some blood pressure medicines. SIGNS AND SYMPTOMS  Pain on one or both sides of your head.  Pulsating or throbbing pain.  Severe pain that prevents daily activities.  Pain that is aggravated by any physical activity.  Nausea, vomiting, or both.  Dizziness.  Pain with exposure to bright lights, loud noises, or activity.  General sensitivity to bright lights, loud noises, or smells. Before you get a migraine, you may get warning signs that a migraine is coming (aura). An aura may include:  Seeing flashing lights.  Seeing bright spots, halos, or zigzag lines.  Having tunnel vision or blurred vision.  Having feelings of numbness or tingling.  Having trouble talking.  Having muscle weakness. DIAGNOSIS  A migraine headache is often diagnosed based on:  Symptoms.  Physical exam.  A CT scan or MRI of your head. These imaging tests cannot diagnose migraines, but they can help rule out other causes of headaches. TREATMENT Medicines may be given for pain and nausea. Medicines can also be given to help prevent recurrent migraines.  HOME CARE INSTRUCTIONS  Only take over-the-counter or prescription medicines for  pain or discomfort as directed by your health care provider. The use of long-term narcotics is not recommended.  Lie down in a dark, quiet room when you have a migraine.  Keep a journal to find out what may trigger your migraine headaches. For example, write down:  What you eat and drink.  How much sleep you get.  Any change to your diet or medicines.  Limit alcohol consumption.  Quit smoking if you smoke.  Get 7-9 hours of sleep, or as recommended by your health care provider.  Limit stress.  Keep lights dim if bright lights bother you and make your migraines worse. SEEK IMMEDIATE MEDICAL CARE IF:   Your migraine becomes severe.  You have a fever.  You have a stiff neck.  You have vision loss.  You have muscular weakness or loss of muscle control.  You start losing your balance or have trouble walking.  You feel faint or pass out.  You have severe symptoms that are different from your first symptoms. MAKE SURE YOU:   Understand these instructions.  Will watch your condition.  Will get help right away if you are not doing well or get worse. Document Released: 05/31/2005 Document Revised: 10/15/2013 Document Reviewed: 02/05/2013 [facility] Patient Information 2015 [place], [place]. This information is not intended to replace advice given to you by your health care provider. Make sure you discuss any questions you have with your health care provider.

## 2014-01-21 NOTE — Assessment & Plan Note (Signed)
Very complicated headache history, now considering it as a chronic headache I agree with Dr. Casper HarrisonStreet, her PCP, this is most likely migraine headaches After extensive discussion she did have some improvement with Imitrex, will increase the dose Imitrex 50 mg, instructions to increase as high as 200 mg in a single day Also wrote prescription for Topamax, however this might not be on the formulary at the health department, however I did give her a printed prescription so she can seek medication assistance program with them. Followup 4 weeks with PCP, reasons to return sooner reviewed

## 2014-01-21 NOTE — Progress Notes (Signed)
Patient ID: Erin Osborn, female   DOB: [birth year], 33 y.o.   MRN: [id number]  Kevin Fenton, MD Phone: [phone]  Subjective:  Chief complaint-noted  Pt Here for headache  Patient states that her headache is a throbbing headache gets worse in the morning. It improves with lying down, even if she doesn't fall asleep. She's had slight improvement with Imitrex in the past but states that the pain came right back after that.  She's had several injections only improves a headache for 2 days at maximum, and have frequently been ineffective.  She's currently on propranolol and Elavil, she states that he did not improve her symptoms.  She does endorse photophobia, phonophobia, and increase in headache with specific scents. She does have intermittent GI upset including nausea and upset stomach.  ROS- Per history of present illness  Past Medical History Patient Active Problem List   Diagnosis Date Noted  . Headache 10/25/2013  . Tooth pain 12/25/2012  . Vesicular rash 10/30/2012  . Health maintenance examination 06/22/2012  . Pap smear, as part of routine gynecological examination 06/22/2012  . Sinusitis 12/22/2011  . Foot pain, left 12/22/2010  . Chronic headaches 10/08/2010  . ALLERGIC RHINITIS 10/31/2008  . OBESITY 08/02/2008  . GERD 06/21/2008  . DEPRESSIVE DISORDER, NOS 08/11/2006  . HEMORRHOIDS, NOS 08/11/2006    Medications- reviewed and updated Current Outpatient Prescriptions  Medication Sig Dispense Refill  . amitriptyline (ELAVIL) 10 MG tablet Take 1 tablet (10 mg total) by mouth at bedtime.  30 tablet  0  . naproxen (NAPROSYN) 500 MG tablet Take 1 tablet (500 mg total) by mouth 2 (two) times daily with a meal.  30 tablet  0  . penicillin v potassium (VEETID) 500 MG tablet Take 1 tablet (500 mg total) by mouth 4 (four) times daily.  40 tablet  0  . propranolol (INDERAL) 40 MG tablet Take 1 tablet (40 mg total) by mouth 2 (two) times daily.  60 tablet  3  .  SUMAtriptan (IMITREX) 25 MG tablet Take 1 tablet (25 mg total) by mouth every 2 (two) hours as needed for migraine or headache. May repeat in 2 hours if headache persists or recurs.  10 tablet  0  . SUMAtriptan (IMITREX) 50 MG tablet Take 1-2 tablets (50-100 mg total) by mouth once. May repeat in 2 hours if headache persists or recurs. No more than 200 mg in a day  20 tablet  0  . topiramate (TOPAMAX) 50 MG tablet Take 1/2 pill daily for 7 days, then 1 pill daily for 7 days, then 2 pills daily for 2 weeks.  42 tablet  0   No current facility-administered medications for this visit.    Objective: BP 122/86  Pulse 68  Temp(Src) 98.2 F (36.8 C) (Oral)  Ht 5\' 2"  (1.575 m)  Wt 200 lb 4.8 oz (90.855 kg)  BMI 36.63 kg/m2 Gen: NAD, alert, cooperative with exam HEENT: NCAT, EOMI, fundus normal bilaterally CV: RRR, good S1/S2, no murmur Resp: CTABL, no wheezes, non-labored Abd: SNTND, BS present, no guarding or organomegaly Ext: No edema, warm Neuro: Alert and oriented, strength 5/5 in bilateral lower extremities, 2+ patellar tendon reflex, EOMI, normal gait, normal speech   Assessment/Plan:  Headache Very complicated headache history, now considering it as a chronic headache I agree with Dr. Casper Harrison, her PCP, this is most likely migraine headaches After extensive discussion she did have some improvement with Imitrex, will increase the dose Imitrex 50 mg, instructions to increase as  high as 200 mg in a single day Also wrote prescription for Topamax, however this might not be on the formulary at the health department, however I did give her a printed prescription so she can seek medication assistance program with them. Followup 4 weeks with PCP, reasons to return sooner reviewed    Meds ordered this encounter  Medications  . DISCONTD: SUMAtriptan (IMITREX) 50 MG tablet    Sig: Take 1-2 tablets (50-100 mg total) by mouth once. May repeat in 2 hours if headache persists or recurs. No more  than 200 mg in a day    Dispense:  10 tablet    Refill:  0  . topiramate (TOPAMAX) 50 MG tablet    Sig: Take 1/2 pill daily for 7 days, then 1 pill daily for 7 days, then 2 pills daily for 2 weeks.    Dispense:  42 tablet    Refill:  0  . SUMAtriptan (IMITREX) 50 MG tablet    Sig: Take 1-2 tablets (50-100 mg total) by mouth once. May repeat in 2 hours if headache persists or recurs. No more than 200 mg in a day    Dispense:  20 tablet    Refill:  0   Note that she's on Depo-Provera for contraception

## 2014-04-24 ENCOUNTER — Ambulatory Visit: Payer: No Typology Code available for payment source | Admitting: Family Medicine

## 2014-04-24 ENCOUNTER — Emergency Department (HOSPITAL_COMMUNITY): Payer: Medicaid Other

## 2014-04-24 ENCOUNTER — Encounter (HOSPITAL_COMMUNITY): Payer: Self-pay | Admitting: Emergency Medicine

## 2014-04-24 ENCOUNTER — Emergency Department (HOSPITAL_COMMUNITY)
Admission: EM | Admit: 2014-04-24 | Discharge: 2014-04-24 | Disposition: A | Discharge status: Home / Self Care | Payer: Medicaid Other | Attending: Emergency Medicine | Admitting: Emergency Medicine

## 2014-04-24 DIAGNOSIS — G43909 Migraine, unspecified, not intractable, without status migrainosus: Secondary | ICD-10-CM | POA: Insufficient documentation

## 2014-04-24 DIAGNOSIS — Z3202 Encounter for pregnancy test, result negative: Secondary | ICD-10-CM | POA: Insufficient documentation

## 2014-04-24 DIAGNOSIS — Z792 Long term (current) use of antibiotics: Secondary | ICD-10-CM | POA: Insufficient documentation

## 2014-04-24 DIAGNOSIS — K5900 Constipation, unspecified: Secondary | ICD-10-CM

## 2014-04-24 DIAGNOSIS — R109 Unspecified abdominal pain: Secondary | ICD-10-CM

## 2014-04-24 DIAGNOSIS — Z8659 Personal history of other mental and behavioral disorders: Secondary | ICD-10-CM | POA: Insufficient documentation

## 2014-04-24 DIAGNOSIS — J45909 Unspecified asthma, uncomplicated: Secondary | ICD-10-CM | POA: Insufficient documentation

## 2014-04-24 DIAGNOSIS — Z79899 Other long term (current) drug therapy: Secondary | ICD-10-CM | POA: Insufficient documentation

## 2014-04-24 DIAGNOSIS — Z791 Long term (current) use of non-steroidal anti-inflammatories (NSAID): Secondary | ICD-10-CM | POA: Insufficient documentation

## 2014-04-24 LAB — COMPREHENSIVE METABOLIC PANEL
ALT: 12 U/L (ref 0–35)
AST: 14 U/L (ref 0–37)
Albumin: 3.5 g/dL (ref 3.5–5.2)
Alkaline Phosphatase: 124 U/L — ABNORMAL HIGH (ref 39–117)
Anion gap: 13 (ref 5–15)
BILIRUBIN TOTAL: 0.5 mg/dL (ref 0.3–1.2)
BUN: 7 mg/dL (ref 6–23)
CHLORIDE: 103 meq/L (ref 96–112)
CO2: 27 mEq/L (ref 19–32)
Calcium: 8.9 mg/dL (ref 8.4–10.5)
Creatinine, Ser: 0.66 mg/dL (ref 0.50–1.10)
GFR calc non Af Amer: >90 mL/min (ref >90)
Glucose, Bld: 101 mg/dL — ABNORMAL HIGH (ref 70–99)
Potassium: 3.2 mEq/L — ABNORMAL LOW (ref 3.7–5.3)
SODIUM: 143 meq/L (ref 137–147)
Total Protein: 7.4 g/dL (ref 6.0–8.3)

## 2014-04-24 LAB — CBC WITH DIFFERENTIAL/PLATELET
BASOS ABS: 0 10*3/uL (ref 0.0–0.1)
Basophils Relative: 0 % (ref 0–1)
EOS PCT: 3 % (ref 0–5)
Eosinophils Absolute: 0.2 10*3/uL (ref 0.0–0.7)
HEMATOCRIT: 41.1 % (ref 36.0–46.0)
Hemoglobin: 13.7 g/dL (ref 12.0–15.0)
LYMPHS PCT: 34 % (ref 12–46)
Lymphs Abs: 3.3 10*3/uL (ref 0.7–4.0)
MCH: 29 pg (ref 26.0–34.0)
MCHC: 33.3 g/dL (ref 30.0–36.0)
MCV: 86.9 fL (ref 78.0–100.0)
MONO ABS: 0.6 10*3/uL (ref 0.1–1.0)
Monocytes Relative: 6 % (ref 3–12)
NEUTROS ABS: 5.5 10*3/uL (ref 1.7–7.7)
Neutrophils Relative %: 57 % (ref 43–77)
PLATELETS: 256 10*3/uL (ref 150–400)
RBC: 4.73 MIL/uL (ref 3.87–5.11)
RDW: 13.7 % (ref 11.5–15.5)
WBC: 9.7 10*3/uL (ref 4.0–10.5)

## 2014-04-24 LAB — URINALYSIS, ROUTINE W REFLEX MICROSCOPIC
BILIRUBIN URINE: NEGATIVE
Glucose, UA: NEGATIVE mg/dL
HGB URINE DIPSTICK: NEGATIVE
KETONES UR: NEGATIVE mg/dL
Nitrite: NEGATIVE
PH: 6 (ref 5.0–8.0)
PROTEIN: NEGATIVE mg/dL
Specific Gravity, Urine: 1.025 (ref 1.005–1.030)
Urobilinogen, UA: 0.2 mg/dL (ref 0.0–1.0)

## 2014-04-24 LAB — I-STAT TROPONIN, ED: Troponin i, poc: 0.01 ng/mL (ref 0.00–0.08)

## 2014-04-24 LAB — URINE MICROSCOPIC-ADD ON

## 2014-04-24 LAB — LIPASE, BLOOD: Lipase: 28 U/L (ref 11–59)

## 2014-04-24 LAB — POC URINE PREG, ED: PREG TEST UR: NEGATIVE

## 2014-04-24 MED ORDER — IOHEXOL 300 MG/ML  SOLN
25.0000 mL | Freq: Once | INTRAMUSCULAR | Status: AC | PRN
  Administered 2014-04-24: 25 mL via ORAL

## 2014-04-24 MED ORDER — SODIUM CHLORIDE 0.9 % IV BOLUS (SEPSIS)
500.0000 mL | Freq: Once | INTRAVENOUS | Status: AC
  Administered 2014-04-24: 500 mL via INTRAVENOUS

## 2014-04-24 MED ORDER — IOHEXOL 300 MG/ML  SOLN
80.0000 mL | Freq: Once | INTRAMUSCULAR | Status: AC | PRN
  Administered 2014-04-24: 80 mL via INTRAVENOUS

## 2014-04-24 NOTE — Discharge Instructions (Signed)
Magnesium citrate:  Drink entire 10 ounce bottle mixed with equal parts Sprite or Gatorade.  Return to the emergency department if you develop worsening pain, high fever, bloody stool, or other new and concerning symptoms.  Be certain to follow-up with your doctor as scheduled.   Abdominal Pain Many things can cause abdominal pain. Usually, abdominal pain is not caused by a disease and will improve without treatment. It can often be observed and treated at home. Your health care provider will do a physical exam and possibly order blood tests and X-rays to help determine the seriousness of your pain. However, in many cases, more time must pass before a clear cause of the pain can be found. Before that point, your health care provider may not know if you need more testing or further treatment. HOME CARE INSTRUCTIONS  Monitor your abdominal pain for any changes. The following actions may help to alleviate any discomfort you are experiencing:  Only take over-the-counter or prescription medicines as directed by your health care provider.  Do not take laxatives unless directed to do so by your health care provider.  Try a clear liquid diet (broth, tea, or water) as directed by your health care provider. Slowly move to a bland diet as tolerated. SEEK MEDICAL CARE IF:  You have unexplained abdominal pain.  You have abdominal pain associated with nausea or diarrhea.  You have pain when you urinate or have a bowel movement.  You experience abdominal pain that wakes you in the night.  You have abdominal pain that is worsened or improved by eating food.  You have abdominal pain that is worsened with eating fatty foods.  You have a fever. SEEK IMMEDIATE MEDICAL CARE IF:   Your pain does not go away within 2 hours.  You keep throwing up (vomiting).  Your pain is felt only in portions of the abdomen, such as the right side or the left lower portion of the abdomen.  You pass bloody or black  tarry stools. MAKE SURE YOU:  Understand these instructions.   Will watch your condition.   Will get help right away if you are not doing well or get worse.  Document Released: 03/10/2005 Document Revised: 06/05/2013 Document Reviewed: 02/07/2013 [facility] Patient Information 2015 [place], [place]. This information is not intended to replace advice given to you by your health care provider. Make sure you discuss any questions you have with your health care provider.

## 2014-04-24 NOTE — ED Notes (Signed)
The patient has been having abdominal pain since saturday.  She has an appointment at the family practice but her pain is so severe she cannot wait.  She says it is lower abdominal pain that she describes as a "sharp" pain.  She rates her pain 5/10.  She denies any N/V, diarrhea or any urinary symptoms.

## 2014-04-24 NOTE — ED Provider Notes (Signed)
CSN: [id number]     Arrival date & time 04/24/14  0147 History   First MD Initiated Contact with Patient 04/24/14 0246     Chief Complaint  Patient presents with  . Abdominal Pain    The patient has been having abdominal pain since saturday.  She has an appointment at the family practice but her pain is so severe she cannot wait.     (Consider location/radiation/quality/duration/timing/severity/associated sxs/prior Treatment) HPI Comments: Patient is a 33 year old female with history of depression, migraines, and asthma. She presents today with complaints of generalized abdominal cramping that has been occurring for the past 3 days. Any time she eats or drinks she gets severe abdominal pains. Her pain is not localized to any one area and seems to move around. She denies any area, constipation, diarrhea, vaginal bleeding, discharge. She has not had a period in many months due to her receiving the Depo shot.  Patient is a 33 y.o. female presenting with abdominal pain.  Abdominal Pain Pain location:  Generalized Pain quality: cramping   Pain radiates to:  Does not radiate Pain severity:  Severe Onset quality:  Gradual Duration:  3 days Timing:  Intermittent Progression:  Worsening Chronicity:  New Relieved by:  Nothing Worsened by:  Nothing tried Ineffective treatments:  None tried   Past Medical History  Diagnosis Date  . Depression   . CTS (carpal tunnel syndrome) 09/2003  . Asthma     as a child  . Migraines    Past Surgical History  Procedure Laterality Date  . Cholecystectomy      Gallstones removed-Around 33 years of age   Family History  Problem Relation Age of Onset  . Hypertension Mother   . Diabetes Mother   . Hypertension Maternal Grandmother   . Diabetes Maternal Grandmother   . Acute myelogenous leukemia Neg Hx    History  Substance Use Topics  . Smoking status: Never Smoker   . Smokeless tobacco: Never Used  . Alcohol Use: No   OB History    No data  available     Review of Systems  Gastrointestinal: Positive for abdominal pain.  All other systems reviewed and are negative.     Allergies  Review of patient's allergies indicates no known allergies.  Home Medications   Prior to Admission medications   Medication Sig Start Date End Date Taking? Authorizing Provider  amitriptyline (ELAVIL) 10 MG tablet Take 1 tablet (10 mg total) by mouth at bedtime. 11/20/13   Stephanie Couphristopher M Street, MD  naproxen (NAPROSYN) 500 MG tablet Take 1 tablet (500 mg total) by mouth 2 (two) times daily with a meal. 10/21/13   Antony MaduraKelly Humes, PA-C  penicillin v potassium (VEETID) 500 MG tablet Take 1 tablet (500 mg total) by mouth 4 (four) times daily. 10/21/13   Antony MaduraKelly Humes, PA-C  propranolol (INDERAL) 40 MG tablet Take 1 tablet (40 mg total) by mouth 2 (two) times daily. 11/15/13   Stephanie Couphristopher M Street, MD  SUMAtriptan (IMITREX) 25 MG tablet Take 1 tablet (25 mg total) by mouth every 2 (two) hours as needed for migraine or headache. May repeat in 2 hours if headache persists or recurs. 10/12/13   Amber Nydia BoutonM Hairford, MD  SUMAtriptan (IMITREX) 50 MG tablet Take 1-2 tablets (50-100 mg total) by mouth once. May repeat in 2 hours if headache persists or recurs. No more than 200 mg in a day 01/21/14   Elenora GammaSamuel L Bradshaw, MD  topiramate (TOPAMAX) 50 MG tablet Take 1/2  pill daily for 7 days, then 1 pill daily for 7 days, then 2 pills daily for 2 weeks. 01/21/14   Elenora GammaSamuel L Bradshaw, MD   BP 114/81 mmHg  Pulse 81  Temp(Src) 97.7 F (36.5 C) (Oral)  Resp 18  SpO2 100% Physical Exam  Constitutional: She is oriented to person, place, and time. She appears well-developed and well-nourished. No distress.  HENT:  Head: Normocephalic and atraumatic.  Neck: Normal range of motion. Neck supple.  Cardiovascular: Normal rate and regular rhythm.  Exam reveals no gallop and no friction rub.   No murmur heard. Pulmonary/Chest: Effort normal and breath sounds normal. No respiratory distress.  She has no wheezes.  Abdominal: Soft. Bowel sounds are normal. She exhibits no distension. There is tenderness.  There is tenderness to palpation in all 4 quadrants with no rebound and no guarding.  Musculoskeletal: Normal range of motion.  Neurological: She is alert and oriented to person, place, and time.  Skin: Skin is warm and dry. She is not diaphoretic.  Nursing note and vitals reviewed.   ED Course  Procedures (including critical care time) Labs Review Labs Reviewed  CBC WITH DIFFERENTIAL  COMPREHENSIVE METABOLIC PANEL  LIPASE, BLOOD  URINALYSIS, ROUTINE W REFLEX MICROSCOPIC  I-STAT TROPOININ, ED  POC URINE PREG, ED    Imaging Review No results found.   EKG Interpretation None      MDM   Final diagnoses:  None    Patient is a 33 year old female who presents with complaints of abdominal pain for the past 3 days. Her discomfort is generalized and worse with eating and movement. Workup today reveals no leukocytosis, normal chemistries, an urinalysis which is unremarkable. Her pregnancy test is negative. CT scan of the abdomen and pelvis reveals a significant quantity of retained stool within the large bowel without evidence for obstruction. I suspect this is the etiology of her discomfort. I will recommend magnesium citrate and follow up with her doctor as scheduled. She is to return if her symptoms worsen or change.    Geoffery Lyonsouglas Shawnte Demarest, MD 04/24/14 [phone]

## 2014-04-24 NOTE — ED Notes (Signed)
Pt. Refused wheelchair 

## 2014-06-10 ENCOUNTER — Ambulatory Visit: Payer: Self-pay | Admitting: Family Medicine

## 2014-09-09 ENCOUNTER — Ambulatory Visit: Payer: Self-pay | Admitting: Family Medicine

## 2014-09-11 ENCOUNTER — Ambulatory Visit (INDEPENDENT_AMBULATORY_CARE_PROVIDER_SITE_OTHER): Payer: Medicaid Other | Admitting: Family Medicine

## 2014-09-11 ENCOUNTER — Encounter: Payer: Self-pay | Admitting: Family Medicine

## 2014-09-11 ENCOUNTER — Other Ambulatory Visit (HOSPITAL_COMMUNITY)
Admission: RE | Admit: 2014-09-11 | Discharge: 2014-09-11 | Disposition: A | Discharge status: Home / Self Care | Payer: Medicaid Other | Source: Ambulatory Visit | Attending: Family Medicine | Admitting: Family Medicine

## 2014-09-11 VITALS — BP 143/98 | HR 79 | Temp 98.2°F | Ht 62.0 in | Wt 204.4 lb

## 2014-09-11 DIAGNOSIS — Z113 Encounter for screening for infections with a predominantly sexual mode of transmission: Secondary | ICD-10-CM | POA: Diagnosis present

## 2014-09-11 DIAGNOSIS — R399 Unspecified symptoms and signs involving the genitourinary system: Secondary | ICD-10-CM

## 2014-09-11 DIAGNOSIS — Z3481 Encounter for supervision of other normal pregnancy, first trimester: Secondary | ICD-10-CM

## 2014-09-11 DIAGNOSIS — Z3201 Encounter for pregnancy test, result positive: Secondary | ICD-10-CM

## 2014-09-11 LAB — POCT UA - MICROSCOPIC ONLY: Epithelial cells, urine per micros: >20

## 2014-09-11 LAB — POCT WET PREP (WET MOUNT): Clue Cells Wet Prep Whiff POC: NEGATIVE

## 2014-09-11 LAB — POCT URINALYSIS DIPSTICK
BILIRUBIN UA: NEGATIVE
Glucose, UA: NEGATIVE
Nitrite, UA: NEGATIVE
PROTEIN UA: 30
SPEC GRAV UA: 1.025
Urobilinogen, UA: 1
pH, UA: 6.5

## 2014-09-11 LAB — POCT URINE PREGNANCY: Preg Test, Ur: POSITIVE

## 2014-09-11 MED ORDER — FLUCONAZOLE 150 MG PO TABS: 150.0000 mg | ORAL_TABLET | Freq: Once | ORAL | Status: DC

## 2014-09-11 MED ORDER — PRENATAL VITAMINS 28-0.8 MG PO TABS: 1.0000 | ORAL_TABLET | Freq: Every day | ORAL | Status: DC

## 2014-09-11 NOTE — Patient Instructions (Signed)
I'm going to treat you for a yeast infection. Take diflucan 150mg  one pill today. Repeat in 3 days if not better. If your symptoms do not improved please call us and let us know and I will send in an antibiotic for your urine. Only take regular tylenol Take prenatal vitamin every day  Ordered ultrasound to help us figure out your due date Get set up for an initial pregnancy visit.  Be well, Dr. Pollie MeyerMcIntyre

## 2014-09-11 NOTE — Progress Notes (Signed)
Patient ID: Erin Osborn, female   DOB: 1980-11-05, 34 y.o.   MRN: [id number]  HPI:  Pt presents for a same day appointment to discuss dysuria.  She's had itching and burning since Friday. It burns when she urinates. It itches all the time. Has not taken any antibiotics recently. No fevers. Has had some mild low back pain for a few days. Normal PO intake. Has been drinking cranberry juice. One female partner for the last 18 years. She is not on any contraception. She's had marked breast tenderness for about a week. Last period was on February 4. Prior to that she has not had a period in the last year, which she has attributed to coming off of depo provera a year ago. Denies pelvic pain or vaginal bleeding.  ROS: See HPI  PMFSH: Hemorrhoids, GERD, depression, obesity  PHYSICAL EXAM: BP 143/98 mmHg  Pulse 79  Temp(Src) 98.2 F (36.8 C) (Oral)  Ht 5\' 2"  (1.575 m)  Wt 204 lb 6.4 oz (92.715 kg)  BMI 37.38 kg/m2 Gen: NAD, pleasant, cooperative HEENT: NCAT Abdomen: soft NTTP GU: normal appearing external genitalia without lesions. Vagina is moist with some thick white discharge. Cervix normal in appearance. No cervical motion tenderness or tenderness on bimanual exam. No adnexal masses. Habitus limits palpation of uterus. Neuro: grossly nonfocal, speech normal  Labs: Pregnancy test POSITIVE  ASSESSMENT/PLAN:  Vaginal itching - urinalysis not necessarily consistent with UTI. Urine microscopy shows many squamous cells, so not a clean catch and difficult to interpret. Her clinical exam and history are more consistent with yeast vaginitis. As such I'll treat her with Diflucan 150 mg 1, repeat in 3 days if not better. Patient has been instructed to follow-up with us if she is not getting better. I told her she can call me and I will send in an antibiotic for a UTI if she does not get better.  Encounter for supervision of other normal pregnancy in first trimester Newly diagnosed pregnancy  today. Dating ultrasound ordered since LMP unsure Start on prenatal vitamin. Only Tylenol as needed for any discomfort, no NSAIDs. Given verification of pregnancy letter for Medicaid purposes Follow-up with pregnancy provider in the next few weeks to establish prenatal care.     FOLLOW UP: F/u in a few weeks for new OB visit  GrenadaBrittany J. Pollie MeyerMcIntyre, MD [facility]

## 2014-09-12 ENCOUNTER — Encounter: Payer: Self-pay | Admitting: Family Medicine

## 2014-09-12 LAB — CERVICOVAGINAL ANCILLARY ONLY
CHLAMYDIA, DNA PROBE: NEGATIVE
NEISSERIA GONORRHEA: NEGATIVE

## 2014-09-13 NOTE — Progress Notes (Signed)
One of the available preceptor. 

## 2014-09-13 NOTE — Assessment & Plan Note (Addendum)
Newly diagnosed pregnancy today. Dating ultrasound ordered since LMP unsure Start on prenatal vitamin. Only Tylenol as needed for any discomfort, no NSAIDs. Given verification of pregnancy letter for Medicaid purposes Follow-up with pregnancy provider in the next few weeks to establish prenatal care.

## 2014-09-16 ENCOUNTER — Inpatient Hospital Stay (HOSPITAL_COMMUNITY)
Admission: AD | Admit: 2014-09-16 | Discharge: 2014-09-16 | Disposition: A | Discharge status: Home / Self Care | Payer: Medicaid Other | Source: Ambulatory Visit | Attending: Obstetrics & Gynecology | Admitting: Obstetrics & Gynecology

## 2014-09-16 ENCOUNTER — Encounter (HOSPITAL_COMMUNITY): Payer: Self-pay

## 2014-09-16 ENCOUNTER — Inpatient Hospital Stay (HOSPITAL_COMMUNITY): Payer: Medicaid Other

## 2014-09-16 DIAGNOSIS — Z3A08 8 weeks gestation of pregnancy: Secondary | ICD-10-CM | POA: Insufficient documentation

## 2014-09-16 DIAGNOSIS — O3680X0 Pregnancy with inconclusive fetal viability, not applicable or unspecified: Secondary | ICD-10-CM

## 2014-09-16 DIAGNOSIS — O4691 Antepartum hemorrhage, unspecified, first trimester: Secondary | ICD-10-CM

## 2014-09-16 DIAGNOSIS — O26851 Spotting complicating pregnancy, first trimester: Secondary | ICD-10-CM | POA: Diagnosis present

## 2014-09-16 DIAGNOSIS — O209 Hemorrhage in early pregnancy, unspecified: Secondary | ICD-10-CM

## 2014-09-16 LAB — URINALYSIS, ROUTINE W REFLEX MICROSCOPIC
BILIRUBIN URINE: NEGATIVE
GLUCOSE, UA: NEGATIVE mg/dL
KETONES UR: NEGATIVE mg/dL
LEUKOCYTES UA: NEGATIVE
NITRITE: NEGATIVE
PH: 5.5 (ref 5.0–8.0)
Protein, ur: NEGATIVE mg/dL
Urobilinogen, UA: 0.2 mg/dL (ref 0.0–1.0)

## 2014-09-16 LAB — WET PREP, GENITAL
Clue Cells Wet Prep HPF POC: NONE SEEN
Trich, Wet Prep: NONE SEEN
Yeast Wet Prep HPF POC: NONE SEEN

## 2014-09-16 LAB — URINE MICROSCOPIC-ADD ON

## 2014-09-16 LAB — CBC
HCT: 36.2 % (ref 36.0–46.0)
Hemoglobin: 12.2 g/dL (ref 12.0–15.0)
MCH: 29.5 pg (ref 26.0–34.0)
MCHC: 33.7 g/dL (ref 30.0–36.0)
MCV: 87.4 fL (ref 78.0–100.0)
PLATELETS: 267 10*3/uL (ref 150–400)
RBC: 4.14 MIL/uL (ref 3.87–5.11)
RDW: 14.4 % (ref 11.5–15.5)
WBC: 12.2 10*3/uL — ABNORMAL HIGH (ref 4.0–10.5)

## 2014-09-16 LAB — HCG, QUANTITATIVE, PREGNANCY: hCG, Beta Chain, Quant, S: 338 m[IU]/mL — ABNORMAL HIGH (ref <5)

## 2014-09-16 NOTE — MAU Note (Signed)
Pt states here for spotting and lower abdominal cramping. Blood noted when wiping only. No recent intercourse.

## 2014-09-16 NOTE — MAU Provider Note (Signed)
History     CSN: [id number]  Arrival date and time: 09/16/14 4098   First Provider Initiated Contact with Patient 09/16/14 1033      Chief Complaint  Patient presents with  . Vaginal Bleeding   HPI    Ms.Erin Osborn is a 34 y.o. female G3P2002 at [redacted]w[redacted]d who presents with vaginal spotting. The spotting started at 0200 this morning. The spotting is pink and is described as heavy at first and now described as light. She does not have a pad on.  She is having lower abdominal cramping that started around the same time as the spotting. She denies intercourse or a vaginal exam in the last 24 hours. She currently rates her pain 3/10.   She went to her PCP last Wednesday and was diagnosed with a yeast infection and found out at that time that she is scheduled. She is scheduled for a dating [place] on Wednesday.   OB History    Gravida Para Term Preterm AB TAB SAB Ectopic Multiple Living   Past Medical History  Diagnosis Date  . CTS (carpal tunnel syndrome) 09/2003  . Asthma     as a child  . Migraines   . Depression     No meds.    Past Surgical History  Procedure Laterality Date  . Cholecystectomy      Gallstones removed-Around 35 years of age    Family History  Problem Relation Age of Onset  . Hypertension Mother   . Diabetes Mother   . Hypertension Maternal Grandmother   . Diabetes Maternal Grandmother   . Acute myelogenous leukemia Neg Hx     History  Substance Use Topics  . Smoking status: Never Smoker   . Smokeless tobacco: Never Used  . Alcohol Use: No    Allergies: No Known Allergies  Prescriptions prior to admission  Medication Sig Dispense Refill Last Dose  . Prenatal Vit-Fe Fumarate-FA (PRENATAL VITAMINS) 28-0.8 MG TABS Take 1 tablet by mouth daily. 30 tablet 11 09/16/2014 at Unknown time  . fluconazole (DIFLUCAN) 150 MG tablet Take 1 tablet (150 mg total) by mouth once. Repeat in 3 days if not better. (Patient not taking: Reported on  09/16/2014) 2 tablet 0 Completed Course at Unknown time   Results for orders placed or performed during the hospital encounter of 09/16/14 (from the past 48 hour(s))  Urinalysis, Routine w reflex microscopic     Status: Abnormal   Collection Time: 09/16/14  9:45 AM  Result Value Ref Range   Color, Urine YELLOW YELLOW   APPearance CLEAR CLEAR   Specific Gravity, Urine >1.030 (H) 1.005 - 1.030   pH 5.5 5.0 - 8.0   Glucose, UA NEGATIVE NEGATIVE mg/dL   Hgb urine dipstick SMALL (A) NEGATIVE   Bilirubin Urine NEGATIVE NEGATIVE   Ketones, ur NEGATIVE NEGATIVE mg/dL   Protein, ur NEGATIVE NEGATIVE mg/dL   Urobilinogen, UA 0.2 0.0 - 1.0 mg/dL   Nitrite NEGATIVE NEGATIVE   Leukocytes, UA NEGATIVE NEGATIVE  Urine microscopic-add on     Status: Abnormal   Collection Time: 09/16/14  9:45 AM  Result Value Ref Range   Squamous Epithelial / LPF FEW (A) RARE   WBC, UA 0-2 <3 WBC/hpf   RBC / HPF 0-2 <3 RBC/hpf   Bacteria, UA RARE RARE  CBC     Status: Abnormal   Collection Time: 09/16/14 10:45 AM  Result Value Ref  Range   WBC 12.2 (H) 4.0 - 10.5 K/uL   RBC 4.14 3.87 - 5.11 MIL/uL   Hemoglobin 12.2 12.0 - 15.0 g/dL   HCT 16.1 09.6 - 04.5 %   MCV 87.4 78.0 - 100.0 fL   MCH 29.5 26.0 - 34.0 pg   MCHC 33.7 30.0 - 36.0 g/dL   RDW 40.9 81.1 - 91.4 %   Platelets 267 150 - 400 K/uL  hCG, quantitative, pregnancy     Status: Abnormal   Collection Time: 09/16/14 10:45 AM  Result Value Ref Range   hCG, Beta Chain, Quant, S 338 (H) <5 mIU/mL    Comment:          GEST. AGE      CONC.  (mIU/mL)   <=1 WEEK        5 - 50     2 WEEKS       50 - 500     3 WEEKS       100 - 10,000     4 WEEKS     1,000 - 30,000     5 WEEKS     3,500 - 115,000   6-8 WEEKS     12,000 - 270,000    12 WEEKS     15,000 - 220,000        FEMALE AND NON-PREGNANT FEMALE:     LESS THAN 5 mIU/mL   Wet prep, genital     Status: Abnormal   Collection Time: 09/16/14 10:45 AM  Result Value Ref Range   Yeast Wet Prep HPF POC NONE  SEEN NONE SEEN   Trich, Wet Prep NONE SEEN NONE SEEN   Clue Cells Wet Prep HPF POC NONE SEEN NONE SEEN   WBC, Wet Prep HPF POC FEW (A) NONE SEEN    Comment: FEW BACTERIA SEEN   US Ob Comp Less 14 Wks  09/16/2014   CLINICAL DATA:  Vaginal bleeding during first trimester pregnancy. Unsure of LMP.  EXAM: OBSTETRIC <14 WK [place] AND TRANSVAGINAL OB US  TECHNIQUE: Both transabdominal and transvaginal ultrasound examinations were performed for complete evaluation of the gestation as well as the maternal uterus, adnexal regions, and pelvic cul-de-sac. Transvaginal technique was performed to assess early pregnancy.  COMPARISON:  None.  FINDINGS: No intrauterine gestational sac or other fluid collection visualized in endometrial cavity. Endometrial thickness measures 6 mm. No fibroids identified.  Small left ovarian corpus luteum cyst noted. Right ovary is normal in appearance. No adnexal masses or free fluid identified.  IMPRESSION: Pregnancy location not visualized sonographically. Differential diagnosis includes recent spontaneous abortion, IUP too early to visualize, and occult ectopic pregnancy. Recommend close follow up of quantitative B-HCG levels, and follow up US as clinically warranted.   Electronically Signed   By: Myles Rosenthal M.D.   On: 09/16/2014 11:40   US Ob Transvaginal  09/16/2014   CLINICAL DATA:  Vaginal bleeding during first trimester pregnancy. Unsure of LMP.  EXAM: OBSTETRIC <14 WK [place] AND TRANSVAGINAL OB US  TECHNIQUE: Both transabdominal and transvaginal ultrasound examinations were performed for complete evaluation of the gestation as well as the maternal uterus, adnexal regions, and pelvic cul-de-sac. Transvaginal technique was performed to assess early pregnancy.  COMPARISON:  None.  FINDINGS: No intrauterine gestational sac or other fluid collection visualized in endometrial cavity. Endometrial thickness measures 6 mm. No fibroids identified.  Small left ovarian corpus luteum cyst noted. Right  ovary is normal in appearance. No adnexal masses or free fluid identified.  IMPRESSION: Pregnancy location not visualized sonographically. Differential diagnosis includes recent spontaneous abortion, IUP too early to visualize, and occult ectopic pregnancy. Recommend close follow up of quantitative B-HCG levels, and follow up US as clinically warranted.   Electronically Signed   By: Myles RosenthalJohn  Stahl M.D.   On: 09/16/2014 11:40    Review of Systems  Constitutional: Negative for fever and chills.  Gastrointestinal: Positive for abdominal pain. Negative for nausea, vomiting, diarrhea and constipation.  Genitourinary: Negative for dysuria.       + light vaginal bleeding   Musculoskeletal: Positive for back pain.   Physical Exam   Blood pressure 121/75, pulse 96, resp. rate 18, last menstrual period 07/18/2014.  Physical Exam  Constitutional: She is oriented to person, place, and time. She appears well-developed and well-nourished. No distress.  HENT:  Head: Normocephalic.  Eyes: Pupils are equal, round, and reactive to light.  Neck: Neck supple.  Cardiovascular: Normal rate.   Respiratory: Effort normal and breath sounds normal.  GI: There is generalized tenderness.  Genitourinary:  Speculum exam: Vagina - Small amount of creamy, brown discharge  Cervix - No contact bleeding Bimanual exam: Cervix closed, No CMT  Uterus non tender, normal size Adnexa non tender, no masses bilaterally Wet prep done Chaperone present for exam.  Musculoskeletal: Normal range of motion.  Neurological: She is alert and oriented to person, place, and time.  Skin: Skin is warm. She is not diaphoretic.  Psychiatric: Her behavior is normal.    MAU Course  Procedures  None  MDM  O positive blood type   Assessment and Plan   A:  1. Pregnancy of unknown anatomic location   2. Vaginal bleeding in pregnancy, first trimester     P:  Discharge home in stable condition Return to the clinic in 48 hours  (weds) for repeat beta hcg level Return to MAU sooner, if symptoms worsen Pelvic rest Ectopic precautions   Duane LopeJennifer I Ladelle Teodoro, NP 09/16/2014 10:51 AM

## 2014-09-17 LAB — HIV ANTIBODY (ROUTINE TESTING W REFLEX): HIV Screen 4th Generation wRfx: NONREACTIVE

## 2014-09-18 ENCOUNTER — Ambulatory Visit (HOSPITAL_COMMUNITY): Payer: Medicaid Other

## 2014-09-18 ENCOUNTER — Telehealth: Payer: Self-pay | Admitting: *Deleted

## 2014-09-18 ENCOUNTER — Other Ambulatory Visit: Payer: Medicaid Other

## 2014-09-18 DIAGNOSIS — O209 Hemorrhage in early pregnancy, unspecified: Secondary | ICD-10-CM

## 2014-09-18 LAB — HCG, QUANTITATIVE, PREGNANCY: hCG, Beta Chain, Quant, S: 732 m[IU]/mL

## 2014-09-18 NOTE — Progress Notes (Unsigned)
Pt here for stat beta hcg. She does not have any bleeding has some mild cramping. Gave patient ectopic precautions and advised that we will call her back around lunch time with results.

## 2014-09-18 NOTE — Telephone Encounter (Signed)
Erin Osborn had a bhcg drawn today. Results of 732 reviewed by Erin KinsmanVirginia Osborn, CNM. Orders to get ultrasound in 10 days from last US. Called Erin Osborn and notified her bhcg had risen and now reccomendation is ultrasound . She denies any bleeding since her MAU visit and is agreeable with plan.  Instructed her to report to MAU if pain or heavy bleeding, she voices understanding. Gave her ultrasound appointment for 09/26/14 at 10:15.

## 2014-09-19 ENCOUNTER — Inpatient Hospital Stay (HOSPITAL_COMMUNITY)
Admission: AD | Admit: 2014-09-19 | Discharge: 2014-09-19 | Disposition: A | Discharge status: Home / Self Care | Payer: Medicaid Other | Source: Ambulatory Visit | Attending: Obstetrics & Gynecology | Admitting: Obstetrics & Gynecology

## 2014-09-19 ENCOUNTER — Encounter (HOSPITAL_COMMUNITY): Payer: Self-pay | Admitting: *Deleted

## 2014-09-19 DIAGNOSIS — R109 Unspecified abdominal pain: Secondary | ICD-10-CM | POA: Insufficient documentation

## 2014-09-19 DIAGNOSIS — Z3A08 8 weeks gestation of pregnancy: Secondary | ICD-10-CM

## 2014-09-19 DIAGNOSIS — O9989 Other specified diseases and conditions complicating pregnancy, childbirth and the puerperium: Secondary | ICD-10-CM | POA: Diagnosis not present

## 2014-09-19 DIAGNOSIS — O26891 Other specified pregnancy related conditions, first trimester: Secondary | ICD-10-CM | POA: Diagnosis not present

## 2014-09-19 DIAGNOSIS — N898 Other specified noninflammatory disorders of vagina: Secondary | ICD-10-CM | POA: Insufficient documentation

## 2014-09-19 DIAGNOSIS — Z3A09 9 weeks gestation of pregnancy: Secondary | ICD-10-CM | POA: Diagnosis not present

## 2014-09-19 LAB — URINALYSIS, ROUTINE W REFLEX MICROSCOPIC
Bilirubin Urine: NEGATIVE
Glucose, UA: NEGATIVE mg/dL
Hgb urine dipstick: NEGATIVE
Ketones, ur: >80 mg/dL — AB
LEUKOCYTES UA: NEGATIVE
Nitrite: NEGATIVE
Protein, ur: NEGATIVE mg/dL
UROBILINOGEN UA: 0.2 mg/dL (ref 0.0–1.0)
pH: 5.5 (ref 5.0–8.0)

## 2014-09-19 NOTE — Discharge Instructions (Signed)
Be sure you are drinking 8 glasses of water each day so you don't get dehydrated. Follow up next week as scheduled for your ultrasound. Return sooner for any problems.

## 2014-09-19 NOTE — MAU Provider Note (Signed)
CSN: [id number]     Arrival date & time 09/19/14  1151 History   None    Chief Complaint  Patient presents with  . Abdominal Pain     (Consider location/radiation/quality/duration/timing/severity/associated sxs/prior Treatment) HPI Erin Osborn is a 34 y.o. [id number] @ 5468w0d gestation who presents to the ED with questions regarding vaginal d/c in pregnancy. Was here on Monday with some spotting, had US, unable to locate preg. Had Bhcg drawn.  Came back yesterday for follow up blood work and there was a normal rise in the Bhcg. Mild cramping pain started last night, rt lower back and lower abd. Comes and goes. Noted a white d/c today, no odor or irritation. She had a wet prep that was normal on previous visit. She denies n/v, UTI symptoms or other problems.     Past Medical History  Diagnosis Date  . CTS (carpal tunnel syndrome) 09/2003  . Asthma     as a child  . Migraines   . Depression     No meds.   Past Surgical History  Procedure Laterality Date  . Cholecystectomy      Gallstones removed-Around 34 years of age   Family History  Problem Relation Age of Onset  . Hypertension Mother   . Diabetes Mother   . Hypertension Maternal Grandmother   . Diabetes Maternal Grandmother   . Acute myelogenous leukemia Neg Hx    History  Substance Use Topics  . Smoking status: Never Smoker   . Smokeless tobacco: Never Used  . Alcohol Use: No   OB History    Gravida Para Term Preterm AB TAB SAB Ectopic Multiple Living   3 2 2       2      Review of Systems Negative except as stated in HPI   Allergies  Review of patient's allergies indicates no known allergies.  Home Medications   Prior to Admission medications   Medication Sig Start Date End Date Taking? Authorizing Provider  Prenatal Vit-Fe Fumarate-FA (PRENATAL VITAMINS) 28-0.8 MG TABS Take 1 tablet by mouth daily. 09/11/14  Yes Latrelle DodrillBrittany J McIntyre, MD   BP 151/85 mmHg  Pulse 91  Temp(Src) 98.2 F (36.8 C)  Resp 18   Ht 5' 1.5" (1.562 m)  Wt 206 lb 9.6 oz (93.713 kg)  BMI 38.41 kg/m2  LMP 07/18/2014 Physical Exam  Constitutional: She is oriented to person, place, and time. She appears well-developed and well-nourished.  HENT:  Head: Normocephalic and atraumatic.  Eyes: Conjunctivae and EOM are normal.  Neck: Neck supple.  Cardiovascular: Normal rate.   Pulmonary/Chest: Effort normal.  Abdominal: Soft. Bowel sounds are normal. There is no tenderness.  Unable to reproduce the cramping pain that comes and goes.   Genitourinary:  Not repeated today  Musculoskeletal: Normal range of motion.  Neurological: She is alert and oriented to person, place, and time. No cranial nerve deficit.  Skin: Skin is warm and dry.  Psychiatric: She has a normal mood and affect. Her behavior is normal.  Nursing note and vitals reviewed.   ED Course  Procedures (including critical care time) Results for orders placed or performed during the hospital encounter of 09/19/14 (from the past 24 hour(s))  Urinalysis, Routine w reflex microscopic     Status: Abnormal   Collection Time: 09/19/14 12:35 PM  Result Value Ref Range   Color, Urine YELLOW YELLOW   APPearance CLOUDY (A) CLEAR   Specific Gravity, Urine >1.030 (H) 1.005 - 1.030   pH 5.5  5.0 - 8.0   Glucose, UA NEGATIVE NEGATIVE mg/dL   Hgb urine dipstick NEGATIVE NEGATIVE   Bilirubin Urine NEGATIVE NEGATIVE   Ketones, ur >80 (A) NEGATIVE mg/dL   Protein, ur NEGATIVE NEGATIVE mg/dL   Urobilinogen, UA 0.2 0.0 - 1.0 mg/dL   Nitrite NEGATIVE NEGATIVE   Leukocytes, UA NEGATIVE NEGATIVE     MDM  33 y.o. [id number] @ [redacted]w[redacted]d gestation with vaginal d/c in first trimester pregnancy. Stable for d/c without abdominal pain. I reviewed with the patient her her lab results from previous visit. I discussed need for drinking plenty of fluids to prevent dehydration and cramping. Patient states she does not drink much water and will increase. She is scheduled for a follow up  ultrasound in one week. She will return as needed for increased pain or heavy bleeding.  Final diagnoses:  Vaginal discharge during pregnancy, antepartum, first trimester

## 2014-09-19 NOTE — MAU Note (Signed)
Was here on Monday with some spotting, had US, unable to locate preg. Came back yesterday for bloodwork, states it went up, f/u US ordered. Pain started last night, rt low back and lower abd. Comes and goes.  Noted a white d/c today, no odor or irritation

## 2014-09-19 NOTE — MAU Note (Signed)
Pt reports she is having some abd cramping that started yesterday. reports a white vaginal discharge as well.

## 2014-09-25 ENCOUNTER — Telehealth: Payer: Self-pay | Admitting: *Deleted

## 2014-09-25 NOTE — Telephone Encounter (Signed)
Pt called stating she felt like she is constantly "wet", like she has used the bathroom.  Pt denies any burning, itching, discharge.  Pt denies any vaginal pressure.  Pt stated she does not have any symptoms other than feeling like she is wet all the time.  Pt has an ultrasound appt tomorrow at 10:15 AM.  Precept with Dr. Jennette KettleNeal; have pt follow with provider.  Appt with Dr. Ermalinda MemosBradshaw at 2 PM 09/26/14.  Clovis PuMartin, Austynn Pridmore L, RN

## 2014-09-26 ENCOUNTER — Inpatient Hospital Stay (HOSPITAL_COMMUNITY)
Admission: AD | Admit: 2014-09-26 | Discharge: 2014-09-26 | Disposition: A | Discharge status: Home / Self Care | Payer: Medicaid Other | Source: Ambulatory Visit | Attending: Obstetrics & Gynecology | Admitting: Obstetrics & Gynecology

## 2014-09-26 ENCOUNTER — Ambulatory Visit (HOSPITAL_COMMUNITY)
Admission: RE | Admit: 2014-09-26 | Discharge: 2014-09-26 | Disposition: A | Discharge status: Home / Self Care | Payer: Medicaid Other | Source: Ambulatory Visit | Attending: Advanced Practice Midwife | Admitting: Advanced Practice Midwife

## 2014-09-26 ENCOUNTER — Ambulatory Visit: Payer: Self-pay | Admitting: Family Medicine

## 2014-09-26 DIAGNOSIS — O3481 Maternal care for other abnormalities of pelvic organs, first trimester: Secondary | ICD-10-CM | POA: Diagnosis not present

## 2014-09-26 DIAGNOSIS — Z3A01 Less than 8 weeks gestation of pregnancy: Secondary | ICD-10-CM | POA: Diagnosis not present

## 2014-09-26 DIAGNOSIS — O0281 Inappropriate change in quantitative human chorionic gonadotropin (hCG) in early pregnancy: Secondary | ICD-10-CM | POA: Diagnosis not present

## 2014-09-26 DIAGNOSIS — Z36 Encounter for antenatal screening of mother: Secondary | ICD-10-CM | POA: Diagnosis present

## 2014-09-26 DIAGNOSIS — Z3A10 10 weeks gestation of pregnancy: Secondary | ICD-10-CM | POA: Insufficient documentation

## 2014-09-26 DIAGNOSIS — N8320 Unspecified ovarian cysts: Secondary | ICD-10-CM | POA: Diagnosis not present

## 2014-09-26 DIAGNOSIS — O9989 Other specified diseases and conditions complicating pregnancy, childbirth and the puerperium: Secondary | ICD-10-CM | POA: Insufficient documentation

## 2014-09-26 DIAGNOSIS — O209 Hemorrhage in early pregnancy, unspecified: Secondary | ICD-10-CM

## 2014-09-26 DIAGNOSIS — O3680X0 Pregnancy with inconclusive fetal viability, not applicable or unspecified: Secondary | ICD-10-CM

## 2014-09-26 LAB — URINALYSIS, ROUTINE W REFLEX MICROSCOPIC
Bilirubin Urine: NEGATIVE
Glucose, UA: NEGATIVE mg/dL
Hgb urine dipstick: NEGATIVE
Ketones, ur: 40 mg/dL — AB
LEUKOCYTES UA: NEGATIVE
NITRITE: NEGATIVE
Protein, ur: NEGATIVE mg/dL
SPECIFIC GRAVITY, URINE: 1.02 (ref 1.005–1.030)
Urobilinogen, UA: 0.2 mg/dL (ref 0.0–1.0)
pH: 6.5 (ref 5.0–8.0)

## 2014-09-26 NOTE — Discharge Instructions (Signed)

## 2014-09-26 NOTE — MAU Provider Note (Signed)
S: 34 y.o. [id number] @[redacted]w[redacted]d  by LMP/U/S presents to MAU for follow up results after scheduled outpatient ultrasound. She denies pain or bleeding today.    O: [place] [facility] Transvaginal  09/26/2014   CLINICAL DATA:  Followup examination.  Assess for progression.  EXAM: TRANSVAGINAL OB ULTRASOUND  TECHNIQUE: Transvaginal ultrasound was performed for complete evaluation of the gestation as well as the maternal uterus, adnexal regions, and pelvic cul-de-sac.  COMPARISON:  09/16/2014  FINDINGS: Intrauterine gestational sac: Single  Yolk sac:  No  Embryo:  No  MSD: 9.9  mm   5 w   5  d  US EDC: 05/24/2015  Maternal uterus/adnexae:  Subchorionic hemorrhage: None  Right ovary: Normal  Left ovary: 2 cysts identified within the left ovary. The largest measures 1.6 x 1.2 x 1.4 cm.  Other :None  Free fluid:  None  IMPRESSION: 1. Probable early intrauterine gestational sac, but no yolk sac, fetal pole, or cardiac activity yet visualized. Recommend follow-up quantitative B-HCG levels and follow-up US in 14 days to confirm and assess viability. This recommendation follows SRU consensus guidelines: Diagnostic Criteria for Nonviable Pregnancy Early in the First Trimester. Malva Limes Engl J Med 2013; 454:0981-19; 369:1443-51.   Electronically Signed   By: Signa Kellaylor  Stroud M.D.   On: 09/26/2014 10:23    A: 1. Pregnancy of unknown anatomic location    P: Reviewed ultrasound findings with pt in MAU D/C home with ectopic precautions Follow up in 1 week with outpatient U/S--ordered    LEFTWICH-KIRBY, LISA, CNM 11:04 AM

## 2014-10-03 ENCOUNTER — Inpatient Hospital Stay (HOSPITAL_COMMUNITY)
Admission: AD | Admit: 2014-10-03 | Discharge: 2014-10-03 | Disposition: A | Discharge status: Home / Self Care | Payer: Medicaid Other | Source: Ambulatory Visit | Attending: Obstetrics & Gynecology | Admitting: Obstetrics & Gynecology

## 2014-10-03 ENCOUNTER — Ambulatory Visit (HOSPITAL_COMMUNITY)
Admission: RE | Admit: 2014-10-03 | Discharge: 2014-10-03 | Disposition: A | Discharge status: Home / Self Care | Payer: Medicaid Other | Source: Ambulatory Visit | Attending: Advanced Practice Midwife | Admitting: Advanced Practice Midwife

## 2014-10-03 DIAGNOSIS — N831 Corpus luteum cyst: Secondary | ICD-10-CM | POA: Diagnosis not present

## 2014-10-03 DIAGNOSIS — O3680X0 Pregnancy with inconclusive fetal viability, not applicable or unspecified: Secondary | ICD-10-CM

## 2014-10-03 DIAGNOSIS — Z3481 Encounter for supervision of other normal pregnancy, first trimester: Secondary | ICD-10-CM | POA: Diagnosis not present

## 2014-10-03 DIAGNOSIS — O9989 Other specified diseases and conditions complicating pregnancy, childbirth and the puerperium: Secondary | ICD-10-CM | POA: Diagnosis not present

## 2014-10-03 DIAGNOSIS — Z3A11 11 weeks gestation of pregnancy: Secondary | ICD-10-CM | POA: Diagnosis not present

## 2014-10-03 DIAGNOSIS — O3481 Maternal care for other abnormalities of pelvic organs, first trimester: Secondary | ICD-10-CM | POA: Diagnosis not present

## 2014-10-03 DIAGNOSIS — Z331 Pregnant state, incidental: Secondary | ICD-10-CM | POA: Diagnosis present

## 2014-10-03 LAB — HCG, QUANTITATIVE, PREGNANCY: hCG, Beta Chain, Quant, S: 38540 m[IU]/mL — ABNORMAL HIGH (ref <5)

## 2014-10-03 NOTE — MAU Provider Note (Signed)
S: Erin Osborn 34 y.o. [id number] [id number] by lmp here for repeat U/S. Denies any vaginal bleeding or cramping.  O: LMP 07/18/2014 [place] Ob Transvaginal  10/03/2014   CLINICAL DATA:  Pregnancy of unknown location  EXAM: TRANSVAGINAL OB ULTRASOUND  TECHNIQUE: Transvaginal ultrasound was performed for complete evaluation of the gestation as well as the maternal uterus, adnexal regions, and pelvic cul-de-sac.  COMPARISON:  09/25/2012  FINDINGS: Intrauterine gestational sac: Visualized/mildly irregular in shape.  Yolk sac:  Not visualized  Embryo:  Not visualized  MSD: 15.6  mm   6 w   3  d  Maternal uterus/adnexae: Given a prior ultrasound 7 days ago demonstrating a gestational sac without yolk sac, this meets one of the criteria that makes this suspicious for nonviable pregnancy. This is not yet meet size criteria for mean sac diameter, the lower threshold for which is set at 16 mm.  Small subchronic hemorrhage.  Right ovary is within normal limits.  Left ovary is notable for a corpus luteal cyst.  No free fluid.  IMPRESSION: Single intrauterine gestational sac without yolk sac or fetal pole, measuring 6 weeks 3 days by mean sac diameter.  Findings are suspicious but not yet definitive for failed pregnancy. Correlate with beta HCG. Recommend follow-up US in 10-14 days for definitive diagnosis.  This recommendation follows SRU consensus guidelines: Diagnostic Criteria for Nonviable Pregnancy Early in the First Trimester. Malva Limes Engl J Med 2013; 454:0981-19; 369:1443-51.   Electronically Signed   By: Charline BillsSriyesh  Krishnan M.D.   On: 10/03/2014 12:04   A: IUP @ 6+3        P: Repeat Beta HCG quant today      Schedule f/u u/s for May 2      Bleeding Precautions given      Follow up at MAU as needed.

## 2014-10-03 NOTE — Discharge Instructions (Signed)
First Trimester of Pregnancy The first trimester of pregnancy is from week 1 until the end of week 12 (months 1 through 3). A week after a sperm fertilizes an egg, the egg will implant on the wall of the uterus. This embryo will begin to develop into a baby. Genes from you and your partner are forming the baby. The female genes determine whether the baby is a boy or a girl. At 6-8 weeks, the eyes and face are formed, and the heartbeat can be seen on ultrasound. At the end of 12 weeks, all the baby's organs are formed.  Now that you are pregnant, you will want to do everything you can to have a healthy baby. Two of the most important things are to get good prenatal care and to follow your health care provider's instructions. Prenatal care is all the medical care you receive before the baby's birth. This care will help prevent, find, and treat any problems during the pregnancy and childbirth. BODY CHANGES Your body goes through many changes during pregnancy. The changes vary from woman to woman.   You may gain or lose a couple of pounds at first.  You may feel sick to your stomach (nauseous) and throw up (vomit). If the vomiting is uncontrollable, call your health care provider.  You may tire easily.  You may develop headaches that can be relieved by medicines approved by your health care provider.  You may urinate more often. Painful urination may mean you have a bladder infection.  You may develop heartburn as a result of your pregnancy.  You may develop constipation because certain hormones are causing the muscles that push waste through your intestines to slow down.  You may develop hemorrhoids or swollen, bulging veins (varicose veins).  Your breasts may begin to grow larger and become tender. Your nipples may stick out more, and the tissue that surrounds them (areola) may become darker.  Your gums may bleed and may be sensitive to brushing and flossing.  Dark spots or blotches (chloasma,  mask of pregnancy) may develop on your face. This will likely fade after the baby is born.  Your menstrual periods will stop.  You may have a loss of appetite.  You may develop cravings for certain kinds of food.  You may have changes in your emotions from day to day, such as being excited to be pregnant or being concerned that something may go wrong with the pregnancy and baby.  You may have more vivid and strange dreams.  You may have changes in your hair. These can include thickening of your hair, rapid growth, and changes in texture. Some women also have hair loss during or after pregnancy, or hair that feels dry or thin. Your hair will most likely return to normal after your baby is born. WHAT TO EXPECT AT YOUR PRENATAL VISITS During a routine prenatal visit:  You will be weighed to make sure you and the baby are growing normally.  Your blood pressure will be taken.  Your abdomen will be measured to track your baby's growth.  The fetal heartbeat will be listened to starting around week 10 or 12 of your pregnancy.  Test results from any previous visits will be discussed. Your health care provider may ask you:  How you are feeling.  If you are feeling the baby move.  If you have had any abnormal symptoms, such as leaking fluid, bleeding, severe headaches, or abdominal cramping.  If you have any questions. Other tests   that may be performed during your first trimester include:  Blood tests to find your blood type and to check for the presence of any previous infections. They will also be used to check for low iron levels (anemia) and Rh antibodies. Later in the pregnancy, blood tests for diabetes will be done along with other tests if problems develop.  Urine tests to check for infections, diabetes, or protein in the urine.  An ultrasound to confirm the proper growth and development of the baby.  An amniocentesis to check for possible genetic problems.  Fetal screens for  spina bifida and Down syndrome.  You may need other tests to make sure you and the baby are doing well. HOME CARE INSTRUCTIONS  Medicines  Follow your health care provider's instructions regarding medicine use. Specific medicines may be either safe or unsafe to take during pregnancy.  Take your prenatal vitamins as directed.  If you develop constipation, try taking a stool softener if your health care provider approves. Diet  Eat regular, well-balanced meals. Choose a variety of foods, such as meat or vegetable-based protein, fish, milk and low-fat dairy products, vegetables, fruits, and whole grain breads and cereals. Your health care provider will help you determine the amount of weight gain that is right for you.  Avoid raw meat and uncooked cheese. These carry germs that can cause birth defects in the baby.  Eating four or five small meals rather than three large meals a day may help relieve nausea and vomiting. If you start to feel nauseous, eating a few soda crackers can be helpful. Drinking liquids between meals instead of during meals also seems to help nausea and vomiting.  If you develop constipation, eat more high-fiber foods, such as fresh vegetables or fruit and whole grains. Drink enough fluids to keep your urine clear or pale yellow. Activity and Exercise  Exercise only as directed by your health care provider. Exercising will help you:  Control your weight.  Stay in shape.  Be prepared for labor and delivery.  Experiencing pain or cramping in the lower abdomen or low back is a good sign that you should stop exercising. Check with your health care provider before continuing normal exercises.  Try to avoid standing for long periods of time. Move your legs often if you must stand in one place for a long time.  Avoid heavy lifting.  Wear low-heeled shoes, and practice good posture.  You may continue to have sex unless your health care provider directs you  otherwise. Relief of Pain or Discomfort  Wear a good support bra for breast tenderness.   Take warm sitz baths to soothe any pain or discomfort caused by hemorrhoids. Use hemorrhoid cream if your health care provider approves.   Rest with your legs elevated if you have leg cramps or low back pain.  If you develop varicose veins in your legs, wear support hose. Elevate your feet for 15 minutes, 3-4 times a day. Limit salt in your diet. Prenatal Care  Schedule your prenatal visits by the twelfth week of pregnancy. They are usually scheduled monthly at first, then more often in the last 2 months before delivery.  Write down your questions. Take them to your prenatal visits.  Keep all your prenatal visits as directed by your health care provider. Safety  Wear your seat belt at all times when driving.  Make a list of emergency phone numbers, including numbers for family, friends, the hospital, and police and fire departments. General Tips    Ask your health care provider for a referral to a local prenatal education class. Begin classes no later than at the beginning of month 6 of your pregnancy.  Ask for help if you have counseling or nutritional needs during pregnancy. Your health care provider can offer advice or refer you to specialists for help with various needs.  Do not use hot tubs, steam rooms, or saunas.  Do not douche or use tampons or scented sanitary pads.  Do not cross your legs for long periods of time.  Avoid cat litter boxes and soil used by cats. These carry germs that can cause birth defects in the baby and possibly loss of the fetus by miscarriage or stillbirth.  Avoid all smoking, herbs, alcohol, and medicines not prescribed by your health care provider. Chemicals in these affect the formation and growth of the baby.  Schedule a dentist appointment. At home, brush your teeth with a soft toothbrush and be gentle when you floss. SEEK MEDICAL CARE IF:   You have  dizziness.  You have mild pelvic cramps, pelvic pressure, or nagging pain in the abdominal area.  You have persistent nausea, vomiting, or diarrhea.  You have a bad smelling vaginal discharge.  You have pain with urination.  You notice increased swelling in your face, hands, legs, or ankles. SEEK IMMEDIATE MEDICAL CARE IF:   You have a fever.  You are leaking fluid from your vagina.  You have spotting or bleeding from your vagina.  You have severe abdominal cramping or pain.  You have rapid weight gain or loss.  You vomit blood or material that looks like coffee grounds.  You are exposed to German measles and have never had them.  You are exposed to fifth disease or chickenpox.  You develop a severe headache.  You have shortness of breath.  You have any kind of trauma, such as from a fall or a car accident. Document Released: 05/25/2001 Document Revised: 10/15/2013 Document Reviewed: 04/10/2013 [employer] Patient Information 2015 [employer]. This information is not intended to replace advice given to you by your health care provider. Make sure you discuss any questions you have with your health care provider.  

## 2014-10-14 ENCOUNTER — Ambulatory Visit (HOSPITAL_COMMUNITY)
Admission: RE | Admit: 2014-10-14 | Discharge: 2014-10-14 | Disposition: A | Discharge status: Home / Self Care | Payer: Medicaid Other | Source: Ambulatory Visit | Attending: Certified Nurse Midwife | Admitting: Certified Nurse Midwife

## 2014-10-14 ENCOUNTER — Other Ambulatory Visit (HOSPITAL_COMMUNITY): Payer: Self-pay | Admitting: Certified Nurse Midwife

## 2014-10-14 ENCOUNTER — Encounter (HOSPITAL_COMMUNITY): Payer: Self-pay | Admitting: Advanced Practice Midwife

## 2014-10-14 ENCOUNTER — Inpatient Hospital Stay (HOSPITAL_COMMUNITY)
Admission: AD | Admit: 2014-10-14 | Discharge: 2014-10-14 | Disposition: A | Discharge status: Home / Self Care | Payer: Medicaid Other | Source: Ambulatory Visit | Attending: Family Medicine | Admitting: Family Medicine

## 2014-10-14 DIAGNOSIS — O021 Missed abortion: Secondary | ICD-10-CM | POA: Diagnosis not present

## 2014-10-14 DIAGNOSIS — O26851 Spotting complicating pregnancy, first trimester: Secondary | ICD-10-CM | POA: Diagnosis present

## 2014-10-14 DIAGNOSIS — Z3A01 Less than 8 weeks gestation of pregnancy: Secondary | ICD-10-CM | POA: Diagnosis not present

## 2014-10-14 DIAGNOSIS — O208 Other hemorrhage in early pregnancy: Secondary | ICD-10-CM | POA: Diagnosis not present

## 2014-10-14 DIAGNOSIS — Z36 Encounter for antenatal screening of mother: Secondary | ICD-10-CM | POA: Insufficient documentation

## 2014-10-14 DIAGNOSIS — Z3481 Encounter for supervision of other normal pregnancy, first trimester: Secondary | ICD-10-CM

## 2014-10-14 NOTE — MAU Provider Note (Signed)
  History     CSN: [id number]  Arrival date and time: 10/14/14 1348   First Provider Initiated Contact with Patient 10/14/14 1417      No chief complaint on file.  HPI [id number] Patient's last menstrual period was 07/18/2014. Patient comes for f/u US for viability and MSD 7 w 4 d no pole and no HR, failed pregnancy. Spotting noted no pain.She declines expectant management and cytotec. Requests to schedule D&C.  Pertinent Gynecological History: Menses: spotting    Past Medical History  Diagnosis Date  . CTS (carpal tunnel syndrome) 09/2003  . Asthma     as a child  . Migraines   . Depression     No meds.    Past Surgical History  Procedure Laterality Date  . Cholecystectomy      Gallstones removed-Around 34 years of age    Family History  Problem Relation Age of Onset  . Hypertension Mother   . Diabetes Mother   . Hypertension Maternal Grandmother   . Diabetes Maternal Grandmother   . Acute myelogenous leukemia Neg Hx     History  Substance Use Topics  . Smoking status: Never Smoker   . Smokeless tobacco: Never Used  . Alcohol Use: No    Allergies: No Known Allergies  Prescriptions prior to admission  Medication Sig Dispense Refill Last Dose  . Prenatal Vit-Fe Fumarate-FA (PRENATAL VITAMINS) 28-0.8 MG TABS Take 1 tablet by mouth daily. 30 tablet 11 09/19/2014 at Unknown time    Review of Systems  Constitutional: Negative.   Respiratory: Negative.   Genitourinary:       Spotting   Physical Exam   Last menstrual period 07/18/2014.  Physical Exam  Constitutional: She is oriented to person, place, and time. She appears well-developed. Distressed: tearful.  Respiratory: Effort normal. No respiratory distress.  Neurological: She is alert and oriented to person, place, and time.    MAU Course  Procedures Study Result     CLINICAL DATA: 34 year old female with spotting in the first trimester of pregnancy. Findings on previous ultrasound suspicious but  not yet definitive for failed pregnancy. Subsequent encounter.  EXAM: TRANSVAGINAL OB ULTRASOUND  TECHNIQUE: Transvaginal ultrasound was performed for complete evaluation of the gestation as well as the maternal uterus, adnexal regions, and pelvic cul-de-sac.  COMPARISON: 10/03/2014 and earlier  FINDINGS: Intrauterine gestational sac: Single  Yolk sac: Not visible  Embryo: Not visible  Cardiac Activity: Not detected  Heart Rate: Not apple ago ball bpm  MSD: 23.9 mm 7 w 4 d  Maternal uterus/adnexae: Small to moderate volume of subchorionic hemorrhage (images 10 and 11). Stable left ovary with figure-of-eight shape probable corpus luteum. The right ovary remains normal. No pelvic free fluid.  IMPRESSION: Continued absence of embryo greater than 2 weeks after a scan that showed a gestational sac without ayolk sac (scan on 09/26/2014), meets definitive criteria for failed pregnancy.  This follows SRU consensus guidelines: Diagnostic Criteria for Nonviable Pregnancy Early in the First Trimester. Macy Mis Engl J Med [id number];369:1443-51.   Electronically Signed  By: Odessa FlemingH Hall M.D.  On: 10/14/2014 13:52    MDM   Assessment and Plan  Missed abortion 7.4 weeks by MSD, will schedule suction D&C by her request. 10/18/14. The procedure and the risk of anesthesia, bleeding, infection, bowel and bladder injury were discussed and her questions were answered. The procedure will be scheduled as an outpatient.   Scheryl Darter.   Kimara Bencomo 10/14/2014, 2:34 PM

## 2014-10-14 NOTE — Discharge Instructions (Signed)
Incomplete Miscarriage °A miscarriage is the sudden loss of an unborn baby (fetus) before the 20th week of pregnancy. In an incomplete miscarriage, parts of the fetus or placenta (afterbirth) remain in the body.  °Having a miscarriage can be an emotional experience. Talk with your health care provider about any questions you may have about miscarrying, the grieving process, and your future pregnancy plans. °CAUSES  °· Problems with the fetal chromosomes that make it impossible for the baby to develop normally. Problems with the baby's genes or chromosomes are most often the result of errors that occur by chance as the embryo divides and grows. The problems are not inherited from the parents. °· Infection of the cervix or uterus. °· Hormone problems. °· Problems with the cervix, such as having an incompetent cervix. This is when the tissue in the cervix is not strong enough to hold the pregnancy. °· Problems with the uterus, such as an abnormally shaped uterus, uterine fibroids, or congenital abnormalities. °· Certain medical conditions. °· Smoking, drinking alcohol, or taking illegal drugs. °· Trauma. °SYMPTOMS  °· Vaginal bleeding or spotting, with or without cramps or pain. °· Pain or cramping in the abdomen or lower back. °· Passing fluid, tissue, or blood clots from the vagina. °DIAGNOSIS  °Your health care provider will perform a physical exam. You may also have an ultrasound to confirm the miscarriage. Blood or urine tests may also be ordered. °TREATMENT  °· Usually, a dilation and curettage (D&C) procedure is performed. During a D&C procedure, the cervix is widened (dilated) and any remaining fetal or placental tissue is gently removed from the uterus. °· Antibiotic medicines are prescribed if there is an infection. Other medicines may be given to reduce the size of the uterus (contract) if there is a lot of bleeding. °· If you have Rh negative blood and your baby was Rh positive, you will need a Rho (D)  immune globulin shot. This shot will protect any future baby from having Rh blood problems in future pregnancies. °· You may be confined to bed rest. This means you should stay in bed and only get up to use the bathroom. °HOME CARE INSTRUCTIONS  °· Rest as directed by your health care provider. °· Restrict activity as directed by your health care provider. You may be allowed to continue light activity if curettage was not done but you require further treatment. °· Keep track of the number of pads you use each day. Keep track of how soaked (saturated) they are. Record this information. °· Do not  use tampons. °· Do not douche or have sexual intercourse until approved by your health care provider. °· Keep all follow-up appointments for reevaluation and continuing management. °· Only take over-the-counter or prescription medicines for pain, fever, or discomfort as directed by your health care provider. °· Take antibiotic medicine as directed by your health care provider. Make sure you finish it even if you start to feel better. °SEEK IMMEDIATE MEDICAL CARE IF:  °· You experience severe cramps in your stomach, back, or abdomen. °· You have an unexplained temperature (make sure to record these temperatures). °· You pass large clots or tissue (save these for your health care provider to inspect). °· Your bleeding increases. °· You become light-headed, weak, or have fainting episodes. °MAKE SURE YOU:  °· Understand these instructions. °· Will watch your condition. °· Will get help right away if you are not doing well or get worse. °Document Released: 05/31/2005 Document Revised: 10/15/2013 Document Reviewed:   12/28/2012 ExitCare Patient Information 2015 [place]. This information is not intended to replace advice given to you by your health care provider. Make sure you discuss any questions you have with your health care provider.   Dilation and Curettage or Vacuum Curettage Dilation and curettage (D&C) and  vacuum curettage are minor procedures. A D&C involves stretching (dilation) the cervix and scraping (curettage) the inside lining of the womb (uterus). During a D&C, tissue is gently scraped from the inside lining of the uterus. During a vacuum curettage, the lining and tissue in the uterus are removed with the use of gentle suction.  Curettage may be performed to either diagnose or treat a problem. As a diagnostic procedure, curettage is performed to examine tissues from the uterus. A diagnostic curettage may be performed for the following symptoms:   Irregular bleeding in the uterus.   Bleeding with the development of clots.   Spotting between menstrual periods.   Prolonged menstrual periods.   Bleeding after menopause.   No menstrual period (amenorrhea).   A change in size and shape of the uterus.  As a treatment procedure, curettage may be performed for the following reasons:   Removal of an IUD (intrauterine device).   Removal of retained placenta after giving birth. Retained placenta can cause an infection or bleeding severe enough to require transfusions.   Abortion.   Miscarriage.   Removal of polyps inside the uterus.   Removal of uncommon types of noncancerous lumps (fibroids).  LET [facility] PROVIDER KNOW ABOUT:   Any allergies you have.   All medicines you are taking, including vitamins, herbs, eye drops, creams, and over-the-counter medicines.   Previous problems you or members of your family have had with the use of anesthetics.   Any blood disorders you have.   Previous surgeries you have had.   Medical conditions you have. RISKS AND COMPLICATIONS  Generally, this is a safe procedure. However, as with any procedure, complications can occur. Possible complications include:  Excessive bleeding.   Infection of the uterus.   Damage to the cervix.   Development of scar tissue (adhesions) inside the uterus, later causing abnormal  amounts of menstrual bleeding.   Complications from the general anesthetic, if a general anesthetic is used.   Putting a hole (perforation) in the uterus. This is rare.  BEFORE THE PROCEDURE   Eat and drink before the procedure only as directed by your health care provider.   Arrange for someone to take you home.  PROCEDURE  This procedure usually takes about 15-30 minutes.  You will be given one of the following:  A medicine that numbs the area in and around the cervix (local anesthetic).   A medicine to make you sleep through the procedure (general anesthetic).  You will lie on your back with your legs in stirrups.   A warm metal or plastic instrument (speculum) will be placed in your vagina to keep it open and to allow the health care provider to see the cervix.  There are two ways in which your cervix can be softened and dilated. These include:   Taking a medicine.   Having thin rods (laminaria) inserted into your cervix.   A curved tool (curette) will be used to scrape cells from the inside lining of the uterus. In some cases, gentle suction is applied with the curette. The curette will then be removed.  AFTER THE PROCEDURE   You will rest in the recovery area until you are  stable and are ready to go home.   You may feel sick to your stomach (nauseous) or throw up (vomit) if you were given a general anesthetic.   You may have a sore throat if a tube was placed in your throat during general anesthesia.   You may have light cramping and bleeding. This may last for 2 days to 2 weeks after the procedure.   Your uterus needs to make a new lining after the procedure. This may make your next period late. Document Released: 05/31/2005 Document Revised: 01/31/2013 Document Reviewed: 12/28/2012 [facility] Patient Information 2015 [place], [place]. This information is not intended to replace advice given to you by your health care provider. Make sure you discuss any  questions you have with your health care provider.

## 2014-10-16 ENCOUNTER — Encounter (HOSPITAL_COMMUNITY): Payer: Self-pay | Admitting: *Deleted

## 2014-10-16 NOTE — Anesthesia Preprocedure Evaluation (Addendum)
Anesthesia Evaluation  Patient identified by MRN, date of birth, ID band Patient awake    Reviewed: Allergy & Precautions, NPO status , Patient's Chart, lab work & pertinent test results, reviewed documented beta blocker date and time   Airway Mallampati: II   Neck ROM: Full    Dental  (+) Dental Advisory Given, Teeth Intact   Pulmonary neg pulmonary ROS,  breath sounds clear to auscultation        Cardiovascular negative cardio ROS  Rhythm:Regular     Neuro/Psych  Headaches, Depression    GI/Hepatic GERD-  Medicated,  Endo/Other    Renal/GU      Musculoskeletal   Abdominal (+)  Abdomen: soft.    Peds negative pediatric ROS (+)  Hematology   Anesthesia Other Findings   Reproductive/Obstetrics                           Anesthesia Physical Anesthesia Plan  ASA: II  Anesthesia Plan: General   Post-op Pain Management:    Induction: Intravenous  Airway Management Planned: Oral ETT  Additional Equipment:   Intra-op Plan:   Post-operative Plan:   Informed Consent: I have reviewed the patients History and Physical, chart, labs and discussed the procedure including the risks, benefits and alternatives for the proposed anesthesia with the patient or authorized representative who has indicated his/her understanding and acceptance.     Plan Discussed with:   Anesthesia Plan Comments: (Check am labs)        Anesthesia Quick Evaluation

## 2014-10-17 ENCOUNTER — Other Ambulatory Visit: Payer: Self-pay | Admitting: Obstetrics & Gynecology

## 2014-10-18 ENCOUNTER — Encounter (HOSPITAL_COMMUNITY): Payer: Self-pay | Admitting: *Deleted

## 2014-10-18 ENCOUNTER — Encounter (HOSPITAL_COMMUNITY)
Admission: RE | Disposition: A | Discharge status: Home / Self Care | Payer: Self-pay | Source: Ambulatory Visit | Attending: Obstetrics & Gynecology

## 2014-10-18 ENCOUNTER — Ambulatory Visit (HOSPITAL_COMMUNITY): Payer: Medicaid Other | Admitting: Anesthesiology

## 2014-10-18 ENCOUNTER — Ambulatory Visit (HOSPITAL_COMMUNITY)
Admission: RE | Admit: 2014-10-18 | Discharge: 2014-10-18 | Disposition: A | Discharge status: Home / Self Care | Payer: Medicaid Other | Source: Ambulatory Visit | Attending: Obstetrics & Gynecology | Admitting: Obstetrics & Gynecology

## 2014-10-18 DIAGNOSIS — Z3A01 Less than 8 weeks gestation of pregnancy: Secondary | ICD-10-CM | POA: Diagnosis not present

## 2014-10-18 DIAGNOSIS — O021 Missed abortion: Secondary | ICD-10-CM

## 2014-10-18 DIAGNOSIS — K219 Gastro-esophageal reflux disease without esophagitis: Secondary | ICD-10-CM | POA: Insufficient documentation

## 2014-10-18 DIAGNOSIS — G43909 Migraine, unspecified, not intractable, without status migrainosus: Secondary | ICD-10-CM | POA: Insufficient documentation

## 2014-10-18 HISTORY — PX: DILATION AND CURETTAGE OF UTERUS: SHX78

## 2014-10-18 LAB — CBC
HEMATOCRIT: 35.7 % — AB (ref 36.0–46.0)
HEMOGLOBIN: 12.3 g/dL (ref 12.0–15.0)
MCH: 30.1 pg (ref 26.0–34.0)
MCHC: 34.5 g/dL (ref 30.0–36.0)
MCV: 87.3 fL (ref 78.0–100.0)
Platelets: 267 10*3/uL (ref 150–400)
RBC: 4.09 MIL/uL (ref 3.87–5.11)
RDW: 14.7 % (ref 11.5–15.5)
WBC: 11.6 10*3/uL — AB (ref 4.0–10.5)

## 2014-10-18 SURGERY — DILATION AND CURETTAGE
Anesthesia: General | Site: Vagina

## 2014-10-18 MED ORDER — LACTATED RINGERS IV SOLN: INTRAVENOUS | Status: DC

## 2014-10-18 MED ORDER — 0.9 % SODIUM CHLORIDE (POUR BTL) OPTIME
TOPICAL | Status: DC | PRN
  Administered 2014-10-18: 1000 mL

## 2014-10-18 MED ORDER — DEXAMETHASONE SODIUM PHOSPHATE 4 MG/ML IJ SOLN
INTRAMUSCULAR | Status: DC | PRN
Start: 2014-10-18 — End: 2014-10-18
  Administered 2014-10-18: 4 mg via INTRAVENOUS

## 2014-10-18 MED ORDER — PROPOFOL 10 MG/ML IV BOLUS
INTRAVENOUS | Status: DC | PRN
  Administered 2014-10-18 (×3): 20 mg via INTRAVENOUS
  Administered 2014-10-18: 40 mg via INTRAVENOUS
  Administered 2014-10-18 (×4): 20 mg via INTRAVENOUS
  Administered 2014-10-18: 30 mg via INTRAVENOUS

## 2014-10-18 MED ORDER — DEXAMETHASONE SODIUM PHOSPHATE 4 MG/ML IJ SOLN
INTRAMUSCULAR | Status: AC
  Filled 2014-10-18: qty 1

## 2014-10-18 MED ORDER — FENTANYL CITRATE (PF) 100 MCG/2ML IJ SOLN
INTRAMUSCULAR | Status: AC
  Filled 2014-10-18: qty 2

## 2014-10-18 MED ORDER — ONDANSETRON HCL 4 MG/2ML IJ SOLN
INTRAMUSCULAR | Status: DC | PRN
Start: 2014-10-18 — End: 2014-10-18
  Administered 2014-10-18: 4 mg via INTRAVENOUS

## 2014-10-18 MED ORDER — SCOPOLAMINE 1 MG/3DAYS TD PT72: 1.0000 | MEDICATED_PATCH | Freq: Once | TRANSDERMAL | Status: DC

## 2014-10-18 MED ORDER — DOXYCYCLINE HYCLATE 100 MG IV SOLR
200.0000 mg | INTRAVENOUS | Status: AC
  Administered 2014-10-18: 200 mg via INTRAVENOUS
  Filled 2014-10-18: qty 200

## 2014-10-18 MED ORDER — BUPIVACAINE-EPINEPHRINE 0.5% -1:200000 IJ SOLN
INTRAMUSCULAR | Status: DC | PRN
  Administered 2014-10-18: 10 mL

## 2014-10-18 MED ORDER — KETOROLAC TROMETHAMINE 30 MG/ML IJ SOLN
INTRAMUSCULAR | Status: AC
  Filled 2014-10-18: qty 1

## 2014-10-18 MED ORDER — MIDAZOLAM HCL 2 MG/2ML IJ SOLN
INTRAMUSCULAR | Status: AC
  Filled 2014-10-18: qty 2

## 2014-10-18 MED ORDER — FENTANYL CITRATE (PF) 100 MCG/2ML IJ SOLN
INTRAMUSCULAR | Status: DC | PRN
  Administered 2014-10-18 (×2): 50 ug via INTRAVENOUS

## 2014-10-18 MED ORDER — BUPIVACAINE-EPINEPHRINE (PF) 0.5% -1:200000 IJ SOLN
INTRAMUSCULAR | Status: AC
  Filled 2014-10-18: qty 30

## 2014-10-18 MED ORDER — LIDOCAINE HCL (CARDIAC) 20 MG/ML IV SOLN
INTRAVENOUS | Status: DC | PRN
  Administered 2014-10-18: 50 mg via INTRAVENOUS

## 2014-10-18 MED ORDER — KETOROLAC TROMETHAMINE 30 MG/ML IJ SOLN
INTRAMUSCULAR | Status: DC | PRN
  Administered 2014-10-18: 30 mg via INTRAVENOUS

## 2014-10-18 MED ORDER — ONDANSETRON HCL 4 MG/2ML IJ SOLN
INTRAMUSCULAR | Status: AC
  Filled 2014-10-18: qty 2

## 2014-10-18 MED ORDER — PROPOFOL 10 MG/ML IV BOLUS
INTRAVENOUS | Status: AC
  Filled 2014-10-18: qty 20

## 2014-10-18 MED ORDER — ACETAMINOPHEN-CODEINE #3 300-30 MG PO TABS: 1.0000 | ORAL_TABLET | ORAL | Status: DC | PRN

## 2014-10-18 MED ORDER — MIDAZOLAM HCL 5 MG/5ML IJ SOLN
INTRAMUSCULAR | Status: DC | PRN
  Administered 2014-10-18: 2 mg via INTRAVENOUS

## 2014-10-18 MED ORDER — LIDOCAINE HCL (CARDIAC) 20 MG/ML IV SOLN
INTRAVENOUS | Status: AC
  Filled 2014-10-18: qty 5

## 2014-10-18 MED ORDER — LACTATED RINGERS IV SOLN
INTRAVENOUS | Status: DC
  Administered 2014-10-18: 09:00:00 via INTRAVENOUS

## 2014-10-18 SURGICAL SUPPLY — 15 items
CATH ROBINSON RED A/P 16FR (CATHETERS) ×3 IMPLANT
CLOTH BEACON ORANGE TIMEOUT ST (SAFETY) ×3 IMPLANT
CONTAINER PREFILL 10% NBF 60ML (FORM) ×2 IMPLANT
GLOVE BIO SURGEON STRL SZ 6.5 (GLOVE) ×2 IMPLANT
GLOVE BIO SURGEONS STRL SZ 6.5 (GLOVE) ×1
GLOVE BIOGEL PI IND STRL 7.0 (GLOVE) ×1 IMPLANT
GLOVE BIOGEL PI INDICATOR 7.0 (GLOVE) ×2
GOWN STRL REUS W/TWL LRG LVL3 (GOWN DISPOSABLE) ×6 IMPLANT
NS IRRIG 1000ML POUR BTL (IV SOLUTION) ×3 IMPLANT
PACK VAGINAL MINOR WOMEN LF (CUSTOM PROCEDURE TRAY) ×3 IMPLANT
PAD OB MATERNITY 4.3X12.25 (PERSONAL CARE ITEMS) ×3 IMPLANT
PAD PREP 24X48 CUFFED NSTRL (MISCELLANEOUS) ×3 IMPLANT
SET BERKELEY SUCTION TUBING (SUCTIONS) ×2 IMPLANT
TOWEL OR 17X24 6PK STRL BLUE (TOWEL DISPOSABLE) ×6 IMPLANT
VACURETTE 9 RIGID CVD (CANNULA) ×2 IMPLANT

## 2014-10-18 NOTE — H&P (Signed)
Erin Osborn is an 34 y.o. female. [id number] Patient's last menstrual period was 07/18/2014. US showed IUP with MSD 7.4 week no pole or YS, c/w failed pregnancy. She is scheduled for suction D&C at her request. The procedure and the risk of anesthesia, bleeding, infection, bowel and bladder injury were discussed and her questions were answered. The procedure is scheduled as an outpatient.    Pertinent Gynecological History: Bleeding: spotting    Menstrual History:  Patient's last menstrual period was 07/18/2014.    Past Medical History  Diagnosis Date  . Depression     No meds.  . SVD (spontaneous vaginal delivery)     x 2  . Migraines     otc med prn    Past Surgical History  Procedure Laterality Date  . Cholecystectomy      Gallstones removed-Around 34 years of age    Family History  Problem Relation Age of Onset  . Hypertension Mother   . Diabetes Mother   . Hypertension Maternal Grandmother   . Diabetes Maternal Grandmother   . Acute myelogenous leukemia Neg Hx     Social History:  reports that she has never smoked. She has never used smokeless tobacco. She reports that she does not drink alcohol or use illicit drugs.  Allergies: No Known Allergies  Prescriptions prior to admission  Medication Sig Dispense Refill Last Dose  . Prenatal Vit-Fe Fumarate-FA (PRENATAL VITAMINS) 28-0.8 MG TABS Take 1 tablet by mouth daily. (Patient not taking: Reported on 10/15/2014) 30 tablet 11 Not Taking at Unknown time    Review of Systems  Constitutional: Negative.   Respiratory: Negative.   Cardiovascular: Negative.   Genitourinary:       Spotting    Blood pressure 143/92, pulse 68, temperature 98.4 F (36.9 C), temperature source Oral, resp. rate 20, height 5' 1.5" (1.562 m), weight 93.895 kg (207 lb), last menstrual period 07/18/2014, SpO2 100 %, not currently breastfeeding. Physical Exam  Constitutional: She is oriented to person, place, and time. She appears  well-developed. No distress.  Cardiovascular: Normal rate.   Respiratory: Effort normal. No respiratory distress.  GI: Soft. She exhibits no distension and no mass.  Neurological: She is alert and oriented to person, place, and time.  Skin: Skin is warm and dry.  Psychiatric: She has a normal mood and affect. Her behavior is normal.    No results found for this or any previous visit (from the past 24 hour(s)).  No results found. CLINICAL DATA: 34 year old female with spotting in the first trimester of pregnancy. Findings on previous ultrasound suspicious but not yet definitive for failed pregnancy. Subsequent encounter.  EXAM: TRANSVAGINAL OB ULTRASOUND  TECHNIQUE: Transvaginal ultrasound was performed for complete evaluation of the gestation as well as the maternal uterus, adnexal regions, and pelvic cul-de-sac.  COMPARISON: 10/03/2014 and earlier  FINDINGS: Intrauterine gestational sac: Single  Yolk sac: Not visible  Embryo: Not visible  Cardiac Activity: Not detected  Heart Rate: Not apple ago ball bpm  MSD: 23.9 mm 7 w 4 d  Maternal uterus/adnexae: Small to moderate volume of subchorionic hemorrhage (images 10 and 11). Stable left ovary with figure-of-eight shape probable corpus luteum. The right ovary remains normal. No pelvic free fluid.  IMPRESSION: Continued absence of embryo greater than 2 weeks after a scan that showed a gestational sac without ayolk sac (scan on 09/26/2014), meets definitive criteria for failed pregnancy.  This follows SRU consensus guidelines: Diagnostic Criteria for Nonviable Pregnancy Early in the First Trimester. N  Dola Factorngl J Med [id number];369:1443-51.   Electronically Signed  By: Odessa FlemingH Hall M.D.  On: 10/14/2014 13:52  Assessment/Plan: 7.4 week failed pregnancy for D&C today  Erin Osborn 10/18/2014, 9:22 AM

## 2014-10-18 NOTE — Transfer of Care (Signed)
Immediate Anesthesia Transfer of Care Note  Patient: Erin Osborn  Procedure(s) Performed: Procedure(s): SUCTION DILATATION AND CURETTAGE (N/A)  Patient Location: PACU  Anesthesia Type:MAC  Level of Consciousness: awake, alert  and oriented  Airway & Oxygen Therapy: Patient Spontanous Breathing and Patient connected to nasal cannula oxygen  Post-op Assessment: Report given to RN  Post vital signs: Reviewed  Last Vitals:  Filed Vitals:   10/18/14 0851  BP: 143/92  Pulse: 68  Temp: 36.9 C  Resp: 20    Complications: No apparent anesthesia complications

## 2014-10-18 NOTE — Op Note (Signed)
Suture: Suction dilation and curettage Preoperative diagnosis: Intrauterine pregnancy with a 7 week 4 day empty gestational sac consistent with failed pregnancy Postoperative diagnosis: Same Surgeon: Dr. Scheryl DarterJames Quanta Robertshaw Anesthesia: Mac and paracervical block Estimated blood loss: Less than 50 mL Specimen: Products of conception Complications: None Drains: None Counts: Correct   Patient gave written consent for suction dilation and curettage with the diagnosis of failed pregnancy at less than [redacted] weeks gestation. Patient identification was confirmed she was brought the operating room and she received anesthesia under MAC. She was placed in dorsal lithotomy position. The perineum and vagina were sterilely prepped and draped in the bladder was drained with a red rubber catheter. Exam revealed a approximately 6-8 week size uterus no adnexal masses. Speculum was inserted and cervix was visualized. Half percent Marcaine with 1 200,000 epinephrine was infiltrated for intracervical block. Cervix was grasped with single-tooth tenaculum. Cervix was dilated sufficiently to pass the 9 mm suction curette. Suction curettage was performed and complete evacuation uterine cavity was assured. Products of conception were obtained and sent to pathology. There was minimal bleeding during the procedure. All instruments were removed. She tolerated the procedure well without complications and was brought in stable condition to PACU.   Dr. Scheryl DarterJames Yecenia Dalgleish 10/18/2014 12:50 PM

## 2014-10-18 NOTE — Anesthesia Postprocedure Evaluation (Signed)
  Anesthesia Post-op Note  Patient: Erin Osborn  Procedure(s) Performed: Procedure(s): SUCTION DILATATION AND CURETTAGE (N/A)  Patient Location: PACU  Anesthesia Type:General  Level of Consciousness: awake and alert   Airway and Oxygen Therapy: Patient Spontanous Breathing and Patient connected to nasal cannula oxygen  Post-op Pain: mild  Post-op Assessment: Post-op Vital signs reviewed, Patient's Cardiovascular Status Stable, Respiratory Function Stable, Patent Airway and No signs of Nausea or vomiting  Post-op Vital Signs: Reviewed and stable  Last Vitals:  Filed Vitals:   10/18/14 0851  BP: 143/92  Pulse: 68  Temp: 36.9 C  Resp: 20    Complications: No apparent anesthesia complications

## 2014-10-18 NOTE — Discharge Instructions (Signed)
Dilation and Curettage or Vacuum Curettage, Care After  Refer to this sheet in the next few weeks. These instructions provide you with information on caring for yourself after your procedure. Your health care provider may also give you more specific instructions. Your treatment has been planned according to current medical practices, but problems sometimes occur. Call your health care provider if you have any problems or questions after your procedure.  WHAT TO EXPECT AFTER THE PROCEDURE  After your procedure, it is typical to have light cramping and bleeding. This may last for 2 days to 2 weeks after the procedure.  HOME CARE INSTRUCTIONS   · Do not drive for 24 hours.  · Wait 1 week before returning to strenuous activities.  · Take your temperature 2 times a day for 4 days and write it down. Provide these temperatures to your health care provider if you develop a fever.  · Avoid long periods of standing.  · Avoid heavy lifting, pushing, or pulling. Do not lift anything heavier than 10 pounds (4.5 kg).  · Limit stair climbing to once or twice a day.  · Take rest periods often.  · You may resume your usual diet.  · Drink enough fluids to keep your urine clear or pale yellow.  · Your usual bowel function should return. If you have constipation, you may:  ¨ Take a mild laxative with permission from your health care provider.  ¨ Add fruit and bran to your diet.  ¨ Drink more fluids.  · Take showers instead of baths until your health care provider gives you permission to take baths.  · Do not go swimming or use a hot tub until your health care provider approves.  · Try to have someone with you or available to you the first 24-48 hours, especially if you were given a general anesthetic.  · Do not douche, use tampons, or have sex (intercourse) for 2 weeks after the procedure.  · Only take over-the-counter or prescription medicines as directed by your health care provider. Do not take aspirin. It can cause  bleeding.  · Follow up with your health care provider as directed.  SEEK MEDICAL CARE IF:   · You have increasing cramps or pain that is not relieved with medicine.  · You have abdominal pain that does not seem to be related to the same area of earlier cramping and pain.  · You have bad smelling vaginal discharge.  · You have a rash.  · You are having problems with any medicine.  SEEK IMMEDIATE MEDICAL CARE IF:   · You have bleeding that is heavier than a normal menstrual period.  · You have a fever.  · You have chest pain.  · You have shortness of breath.  · You feel dizzy or feel like fainting.  · You pass out.  · You have pain in your shoulder strap area.  · You have heavy vaginal bleeding with or without blood clots.  MAKE SURE YOU:   · Understand these instructions.  · Will watch your condition.  · Will get help right away if you are not doing well or get worse.  Document Released: 05/28/2000 Document Revised: 06/05/2013 Document Reviewed: 12/28/2012  ExitCare® Patient Information ©2015 [employer]. This information is not intended to replace advice given to you by your health care provider. Make sure you discuss any questions you have with your health care provider.

## 2014-10-21 ENCOUNTER — Encounter (HOSPITAL_COMMUNITY): Payer: Self-pay | Admitting: Obstetrics & Gynecology

## 2014-11-07 ENCOUNTER — Ambulatory Visit (INDEPENDENT_AMBULATORY_CARE_PROVIDER_SITE_OTHER): Payer: Medicaid Other | Admitting: Obstetrics & Gynecology

## 2014-11-07 ENCOUNTER — Encounter: Payer: Self-pay | Admitting: Obstetrics & Gynecology

## 2014-11-07 VITALS — BP 142/83 | HR 64 | Temp 98.0°F | Wt 200.8 lb

## 2014-11-07 DIAGNOSIS — O021 Missed abortion: Secondary | ICD-10-CM

## 2014-11-07 DIAGNOSIS — Z9889 Other specified postprocedural states: Secondary | ICD-10-CM

## 2014-11-07 NOTE — Patient Instructions (Signed)
Miscarriage A miscarriage is the sudden loss of an unborn baby (fetus) before the 20th week of pregnancy. Most miscarriages happen in the first 3 months of pregnancy. Sometimes, it happens before a woman even knows she is pregnant. A miscarriage is also called a "spontaneous miscarriage" or "early pregnancy loss." Having a miscarriage can be an emotional experience. Talk with your caregiver about any questions you may have about miscarrying, the grieving process, and your future pregnancy plans. CAUSES   Problems with the fetal chromosomes that make it impossible for the baby to develop normally. Problems with the baby's genes or chromosomes are most often the result of errors that occur, by chance, as the embryo divides and grows. The problems are not inherited from the parents.  Infection of the cervix or uterus.   Hormone problems.   Problems with the cervix, such as having an incompetent cervix. This is when the tissue in the cervix is not strong enough to hold the pregnancy.   Problems with the uterus, such as an abnormally shaped uterus, uterine fibroids, or congenital abnormalities.   Certain medical conditions.   Smoking, drinking alcohol, or taking illegal drugs.   Trauma.  Often, the cause of a miscarriage is unknown.  SYMPTOMS   Vaginal bleeding or spotting, with or without cramps or pain.  Pain or cramping in the abdomen or lower back.  Passing fluid, tissue, or blood clots from the vagina. DIAGNOSIS  Your caregiver will perform a physical exam. You may also have an ultrasound to confirm the miscarriage. Blood or urine tests may also be ordered. TREATMENT   Sometimes, treatment is not necessary if you naturally pass all the fetal tissue that was in the uterus. If some of the fetus or placenta remains in the body (incomplete miscarriage), tissue left behind may become infected and must be removed. Usually, a dilation and curettage (D and C) procedure is performed.  During a D and C procedure, the cervix is widened (dilated) and any remaining fetal or placental tissue is gently removed from the uterus.  Antibiotic medicines are prescribed if there is an infection. Other medicines may be given to reduce the size of the uterus (contract) if there is a lot of bleeding.  If you have Rh negative blood and your baby was Rh positive, you will need a Rh immunoglobulin shot. This shot will protect any future baby from having Rh blood problems in future pregnancies. HOME CARE INSTRUCTIONS   Your caregiver may order bed rest or may allow you to continue light activity. Resume activity as directed by your caregiver.  Have someone help with home and family responsibilities during this time.   Keep track of the number of sanitary pads you use each day and how soaked (saturated) they are. Write down this information.   Do not use tampons. Do not douche or have sexual intercourse until approved by your caregiver.   Only take over-the-counter or prescription medicines for pain or discomfort as directed by your caregiver.   Do not take aspirin. Aspirin can cause bleeding.   Keep all follow-up appointments with your caregiver.   If you or your partner have problems with grieving, talk to your caregiver or seek counseling to help cope with the pregnancy loss. Allow enough time to grieve before trying to get pregnant again.  SEEK IMMEDIATE MEDICAL CARE IF:   You have severe cramps or pain in your back or abdomen.  You have a fever.  You pass large blood clots (walnut-sized   or larger) ortissue from your vagina. Save any tissue for your caregiver to inspect.   Your bleeding increases.   You have a thick, bad-smelling vaginal discharge.  You become lightheaded, weak, or you faint.   You have chills.  MAKE SURE YOU:  Understand these instructions.  Will watch your condition.  Will get help right away if you are not doing well or get  worse. Document Released: 11/24/2000 Document Revised: 09/25/2012 Document Reviewed: 07/20/2011 ExitCare Patient Information 2015 [employer]. This information is not intended to replace advice given to you by your health care provider. Make sure you discuss any questions you have with your health care provider.  

## 2014-11-12 NOTE — Progress Notes (Signed)
Subjective:no complaints     Erin Osborn is a 34 y.o. female who presents to the clinic 3 weeks status post suction D&C for missed abortion. Eating a regular diet without difficulty. Bowel movements are normal. The patient is not having any pain.  The following portions of the patient's history were reviewed and updated as appropriate: allergies, current medications, past family history, past medical history, past social history, past surgical history and problem list.  Review of Systems Pertinent items are noted in HPI.    Objective:    BP 142/83 mmHg  Pulse 64  Temp(Src) 98 F (36.7 C)  Wt 200 lb 12.8 oz (91.082 kg)  LMP 07/18/2014 General:  alert, cooperative and no distress  Abdomen: soft, non-tender        Assessment:    Doing well postoperatively. Operative findings again reviewed. Pathology report discussed.    Plan:    1. Continue any current medications. 2. Wound care discussed. 3. Activity restrictions: none 4. Anticipated return to work: now. 5. Follow up: needed. Wants to conceive  Adam PhenixJames G Cameron Katayama, MD Seen on 11/07/14

## 2015-01-21 ENCOUNTER — Encounter: Payer: Self-pay | Admitting: *Deleted

## 2015-04-11 ENCOUNTER — Encounter: Payer: Self-pay | Admitting: Family Medicine

## 2015-04-11 ENCOUNTER — Ambulatory Visit (INDEPENDENT_AMBULATORY_CARE_PROVIDER_SITE_OTHER): Payer: Medicaid Other | Admitting: Family Medicine

## 2015-04-11 VITALS — BP 120/80 | HR 79 | Temp 98.3°F | Ht 62.0 in | Wt 206.0 lb

## 2015-04-11 DIAGNOSIS — Z3481 Encounter for supervision of other normal pregnancy, first trimester: Secondary | ICD-10-CM

## 2015-04-11 DIAGNOSIS — N912 Amenorrhea, unspecified: Secondary | ICD-10-CM

## 2015-04-11 DIAGNOSIS — Z23 Encounter for immunization: Secondary | ICD-10-CM | POA: Diagnosis not present

## 2015-04-11 LAB — POCT URINE PREGNANCY: Preg Test, Ur: POSITIVE — AB

## 2015-04-11 MED ORDER — PRENATAL VITAMINS 28-0.8 MG PO TABS: 1.0000 | ORAL_TABLET | Freq: Every day | ORAL | Status: DC

## 2015-04-11 NOTE — Progress Notes (Signed)
Subjective:    Patient ID: Erin Osborn, female    DOB: 26-Aug-1980, 34 y.o.   MRN: [id number]  Erin Osborn is a 34 y.o. female presenting on 04/11/2015 for Possible Pregnancy  Patient presents for a same day appointment.  HPI  AMENORRHEA: - reports LMP 02/13/2015, had a positive home Upreg last night, presents here today to confirm pregnancy, this is a desired pregnancy, she had husband trying to conceive. - [id number], First two pregnancies were full term, approx 6 lbs, both NSVD, without complications - Last pregnancy recently in 10/2014 with miscarriage (SAB) at approx 7-8 weeks, [facility] - Needs letter today stating pregnant  MORNING SICKNESS: - Reports some mild nausea in mornings for past few days to week. Has had with other pregnancies. No vomiting. No other symptoms, denies abdominal pain, fevers/chills, diarrhea. Has not taken any medicine for this.  History of migraine HAs, was taking excedrin migraine and Tylenol, this has resolved and   Past Medical History  Diagnosis Date  . Depression     No meds.  . SVD (spontaneous vaginal delivery)     x 2  . Migraines     otc med prn    Social History   Social History  . Marital Status: Single    Spouse Name: N/A  . Number of Children: N/A  . Years of Education: N/A   Occupational History  . Not on file.   Social History Main Topics  . Smoking status: Never Smoker   . Smokeless tobacco: Never Used  . Alcohol Use: No  . Drug Use: No  . Sexual Activity: Yes    Birth Control/ Protection: None   Other Topics Concern  . Not on file   Social History Narrative    Current Outpatient Prescriptions on File Prior to Visit  Medication Sig  . acetaminophen-codeine (TYLENOL #3) 300-30 MG per tablet Take 1-2 tablets by mouth every 4 (four) hours as needed for moderate pain.   No current facility-administered medications on file prior to visit.    Review of  Systems  Constitutional: Negative for fever, chills, diaphoresis, activity change, appetite change and fatigue.  Respiratory: Negative for cough and shortness of breath.   Cardiovascular: Negative for leg swelling.  Gastrointestinal: Positive for nausea (morning sickness w/o vomiting). Negative for vomiting, abdominal pain, diarrhea and constipation.  Genitourinary: Negative for dysuria and frequency.  Skin: Negative for rash.  Neurological: Negative for headaches.   Per HPI unless specifically indicated above     Objective:    BP 120/80 mmHg  Pulse 79  Temp(Src) 98.3 F (36.8 C) (Oral)  Ht 5\' 2"  (1.575 m)  Wt 206 lb (93.441 kg)  BMI 37.67 kg/m2  SpO2 99%  LMP 07/18/2014  Wt Readings from Last 3 Encounters:  04/11/15 206 lb (93.441 kg)  11/07/14 200 lb 12.8 oz (91.082 kg)  10/16/14 207 lb (93.895 kg)    Physical Exam  Constitutional: She is oriented to person, place, and time. She appears well-developed and well-nourished. No distress.  Abdominal: Soft. Bowel sounds are normal. She exhibits no distension. There is no tenderness.  Neurological: She is alert and oriented to person, place, and time.  Skin: Skin is warm and dry. She is not diaphoretic.  Nursing note and vitals reviewed.  Results for orders placed or performed in visit on 04/11/15  POCT urine pregnancy  Result Value Ref Range   Preg Test, Ur Positive (A) Negative  Assessment & Plan:   Problem List Items Addressed This Visit    None    Visit Diagnoses    Amenorrhea    -  Primary    Confirmed preg 04/11/15 on urine preg test in office. Letter written confirming pregnancy, advised to get Medicaid, f/u 1-3 wk initial prenatal. Start PNV    Relevant Orders    POCT urine pregnancy (Completed)    Encounter for immunization        Encounter for supervision of other normal pregnancy in first trimester        Relevant Medications    Prenatal Vit-Fe Fumarate-FA (PRENATAL VITAMINS) 28-0.8 MG TABS         Meds ordered this encounter  Medications  . Prenatal Vit-Fe Fumarate-FA (PRENATAL VITAMINS) 28-0.8 MG TABS    Sig: Take 1 tablet by mouth daily.    Dispense:  30 tablet    Refill:  11      Follow up plan: No Follow-up on file.  A total of 15 minutes was spent face-to-face with this patient. Over half this time was spent on counseling patient on the diagnosis and different diagnostic and therapeutic options available.  Saralyn Pilar, DO [facility], PGY-3

## 2015-04-11 NOTE — Patient Instructions (Signed)
Thank you for coming in to clinic today.  1. Congratulations on your pregnancy! Confirmed today on urine pregnancy test. 2. You are approximately [redacted] weeks pregnant, and due date is approximately November 20, 2015 3. For morning sickness (very common early in pregnancy)  - There are a number of factors that can make this worse as discussed. - Recommend to try Vitamin B6 at bedtime, Unisom (doxyalmine), or Ginger as well for over the counter treatments - For Acid Reflux - you can try Zantac over the counter, 75mg  twice daily for at least 2 weeks, if needed this may need to be increased - Important to stay hydrated, drink and eat small amounts frequently to avoid worsening nausea, try salty foods like pretzels, crackers, also sweet foods can help too. Later in day eat regular meals and drink plenty of water.  Please schedule [facility] for Initial OB visit - in about 1 - 3 weeks  - You will need initial Pregnancy Labs and an Early Ultrasound as well  If you have any other questions or concerns, please feel free to call the clinic to contact me. You may also schedule an earlier appointment if necessary.  However, if your symptoms get significantly worse, please go to the Emergency Department to seek immediate medical attention.  Saralyn PilarAlexander Karamalegos, DO [facility]   First Trimester of Pregnancy The first trimester of pregnancy is from week 1 until the end of week 12 (months 1 through 3). During this time, your baby will begin to develop inside you. At 6-8 weeks, the eyes and face are formed, and the heartbeat can be seen on ultrasound. At the end of 12 weeks, all the baby's organs are formed. Prenatal care is all the medical care you receive before the birth of your baby. Make sure you get good prenatal care and follow all of your doctor's instructions. HOME CARE  Medicines  Take medicine only as told by your doctor. Some medicines are safe and some are not  during pregnancy.  Take your prenatal vitamins as told by your doctor.  Take medicine that helps you poop (stool softener) as needed if your doctor says it is okay. Diet  Eat regular, healthy meals.  Your doctor will tell you the amount of weight gain that is right for you.  Avoid raw meat and uncooked cheese.  If you feel sick to your stomach (nauseous) or throw up (vomit):  Eat 4 or 5 small meals a day instead of 3 large meals.  Try eating a few soda crackers.  Drink liquids between meals instead of during meals.  If you have a hard time pooping (constipation):  Eat high-fiber foods like fresh vegetables, fruit, and whole grains.  Drink enough fluids to keep your pee (urine) clear or pale yellow. Activity and Exercise  Exercise only as told by your doctor. Stop exercising if you have cramps or pain in your lower belly (abdomen) or low back.  Try to avoid standing for long periods of time. Move your legs often if you must stand in one place for a long time.  Avoid heavy lifting.  Wear low-heeled shoes. Sit and stand up straight.  You can have sex unless your doctor tells you not to. Relief of Pain or Discomfort  Wear a good support bra if your breasts are sore.  Take warm water baths (sitz baths) to soothe pain or discomfort caused by hemorrhoids. Use hemorrhoid cream if your doctor says it is  okay.  Rest with your legs raised if you have leg cramps or low back pain.  Wear support hose if you have puffy, bulging veins (varicose veins) in your legs. Raise (elevate) your feet for 15 minutes, 3-4 times a day. Limit salt in your diet. Prenatal Care  Schedule your prenatal visits by the twelfth week of pregnancy.  Write down your questions. Take them to your prenatal visits.  Keep all your prenatal visits as told by your doctor. Safety  Wear your seat belt at all times when driving.  Make a list of emergency phone numbers. The list should include numbers for  family, friends, the hospital, and police and fire departments. General Tips  Ask your doctor for a referral to a local prenatal class. Begin classes no later than at the start of month 6 of your pregnancy.  Ask for help if you need counseling or help with nutrition. Your doctor can give you advice or tell you where to go for help.  Do not use hot tubs, steam rooms, or saunas.  Do not douche or use tampons or scented sanitary pads.  Do not cross your legs for long periods of time.  Avoid litter boxes and soil used by cats.  Avoid all smoking, herbs, and alcohol. Avoid drugs not approved by your doctor.  Do not use any tobacco products, including cigarettes, chewing tobacco, and electronic cigarettes. If you need help quitting, ask your doctor. You may get counseling or other support to help you quit.  Visit your dentist. At home, brush your teeth with a soft toothbrush. Be gentle when you floss. GET HELP IF:  You are dizzy.  You have mild cramps or pressure in your lower belly.  You have a nagging pain in your belly area.  You continue to feel sick to your stomach, throw up, or have watery poop (diarrhea).  You have a bad smelling fluid coming from your vagina.  You have pain with peeing (urination).  You have increased puffiness (swelling) in your face, hands, legs, or ankles. GET HELP RIGHT AWAY IF:   You have a fever.  You are leaking fluid from your vagina.  You have spotting or bleeding from your vagina.  You have very bad belly cramping or pain.  You gain or lose weight rapidly.  You throw up blood. It may look like coffee grounds.  You are around people who have [place] measles, fifth disease, or chickenpox.  You have a very bad headache.  You have shortness of breath.  You have any kind of trauma, such as from a fall or a car accident.   This information is not intended to replace advice given to you by your health care provider. Make sure you discuss  any questions you have with your health care provider.   Document Released: 11/17/2007 Document Revised: 06/21/2014 Document Reviewed: 04/10/2013 Elsevier Interactive Patient Education [employer]

## 2015-04-18 ENCOUNTER — Other Ambulatory Visit: Payer: Medicaid Other

## 2015-04-18 DIAGNOSIS — Z3481 Encounter for supervision of other normal pregnancy, first trimester: Secondary | ICD-10-CM

## 2015-04-18 NOTE — Progress Notes (Signed)
New ob labs done today Erin Osborn 

## 2015-04-19 LAB — SICKLE CELL SCREEN: Sickle Cell Screen: NEGATIVE

## 2015-04-19 LAB — HIV ANTIBODY (ROUTINE TESTING W REFLEX): HIV: NONREACTIVE

## 2015-04-20 LAB — CULTURE, OB URINE: Colony Count: 60000

## 2015-04-21 LAB — OBSTETRIC PANEL
Antibody Screen: NEGATIVE
Basophils Absolute: 0 10*3/uL (ref 0.0–0.1)
Basophils Relative: 0 % (ref 0–1)
EOS PCT: 1 % (ref 0–5)
Eosinophils Absolute: 0.1 10*3/uL (ref 0.0–0.7)
HCT: 36.5 % (ref 36.0–46.0)
HEMOGLOBIN: 12.2 g/dL (ref 12.0–15.0)
Hepatitis B Surface Ag: NEGATIVE
LYMPHS ABS: 2.6 10*3/uL (ref 0.7–4.0)
Lymphocytes Relative: 22 % (ref 12–46)
MCH: 29.3 pg (ref 26.0–34.0)
MCHC: 33.4 g/dL (ref 30.0–36.0)
MCV: 87.7 fL (ref 78.0–100.0)
MPV: 10.9 fL (ref 8.6–12.4)
Monocytes Absolute: 0.8 10*3/uL (ref 0.1–1.0)
Monocytes Relative: 7 % (ref 3–12)
Neutro Abs: 8.3 10*3/uL — ABNORMAL HIGH (ref 1.7–7.7)
Neutrophils Relative %: 70 % (ref 43–77)
Platelets: 324 10*3/uL (ref 150–400)
RBC: 4.16 MIL/uL (ref 3.87–5.11)
RDW: 14.9 % (ref 11.5–15.5)
RUBELLA: 0.9 {index} — AB (ref <0.90)
Rh Type: POSITIVE
WBC: 11.9 10*3/uL — AB (ref 4.0–10.5)

## 2015-04-25 ENCOUNTER — Ambulatory Visit (INDEPENDENT_AMBULATORY_CARE_PROVIDER_SITE_OTHER): Payer: Medicaid Other | Admitting: Family Medicine

## 2015-04-25 ENCOUNTER — Other Ambulatory Visit (HOSPITAL_COMMUNITY)
Admission: RE | Admit: 2015-04-25 | Discharge: 2015-04-25 | Disposition: A | Discharge status: Home / Self Care | Payer: Medicaid Other | Source: Ambulatory Visit | Attending: Family Medicine | Admitting: Family Medicine

## 2015-04-25 ENCOUNTER — Encounter: Payer: Self-pay | Admitting: Family Medicine

## 2015-04-25 VITALS — BP 118/78 | HR 78 | Temp 98.3°F | Wt 203.0 lb

## 2015-04-25 DIAGNOSIS — Z3481 Encounter for supervision of other normal pregnancy, first trimester: Secondary | ICD-10-CM

## 2015-04-25 DIAGNOSIS — Z113 Encounter for screening for infections with a predominantly sexual mode of transmission: Secondary | ICD-10-CM | POA: Diagnosis present

## 2015-04-25 DIAGNOSIS — Z3491 Encounter for supervision of normal pregnancy, unspecified, first trimester: Secondary | ICD-10-CM | POA: Insufficient documentation

## 2015-04-25 LAB — GLUCOSE, CAPILLARY: GLUCOSE-CAPILLARY: 148 mg/dL — AB (ref 65–99)

## 2015-04-25 MED ORDER — VITAMIN B-6 25 MG PO TABS: 25.0000 mg | ORAL_TABLET | Freq: Three times a day (TID) | ORAL | Status: DC

## 2015-04-25 NOTE — Patient Instructions (Signed)

## 2015-04-25 NOTE — Addendum Note (Signed)
Addended by: Jone BasemanFLEEGER, Seger Jani D on: 04/25/2015 10:46 AM   Modules accepted: Orders

## 2015-04-25 NOTE — Progress Notes (Signed)
Erin Osborn is a 34 y.o. yo [id number] at Unknown who presents for her initial prenatal visit. Pregnancy  is planned She reports morning sickness, nausea and positive home pregnancy test. She  isTaking PNV. See flow sheet for details.  PMH, POBH, FH, meds, allergies and Social Hx reviewed.  Prenatal exam:Gen: Well nourished, well developed.  No distress.  Vitals noted. HEENT: Normocephalic, atraumatic.  Neck supple without cervical lymphadenopathy, thyromegaly or thyroid nodules.  fair dentition. CV: RRR no murmur, gallops or rubs Lungs: CTA B.  Normal respiratory effort without wheezes or rales. Abd: soft, NTND. +BS.  Uterus not appreciated above pelvis. GU: Normal external female genitalia without lesions.  Nl vaginal, well rugated without lesions. No vaginal discharge.  Bimanual exam: No adnexal mass or TTP. No CMT.  Uterus size below umbilicus Ext: No clubbing, cyanosis or edema. Psych: Normal grooming and dress.  Not depressed or anxious appearing.  Normal thought content and process without flight of ideas or looseness of associations    Assessment/plan: 1) Pregnancy Unknown doing well.  Current pregnancy issues include morning sickness Dating is not reliable Prenatal labs reviewed, notable for non-immune to Hep B. Bleeding and pain precautions reviewed. Importance of prenatal vitamins reviewed.  Genetic screening offered. - MFM Nuchal ordered Early glucola is indicated. - to be done today   Follow up 4 weeks.  Erasmo DownerAngela M Bacigalupo, MD, MPH PGY-2,  [facility] 04/25/2015 9:27 AM

## 2015-04-28 ENCOUNTER — Telehealth: Payer: Self-pay | Admitting: Family Medicine

## 2015-04-28 DIAGNOSIS — R7309 Other abnormal glucose: Secondary | ICD-10-CM | POA: Insufficient documentation

## 2015-04-28 LAB — GC/CHLAMYDIA PROBE AMP (~~LOC~~) NOT AT ARMC
Chlamydia: NEGATIVE
NEISSERIA GONORRHEA: NEGATIVE

## 2015-04-28 NOTE — Telephone Encounter (Signed)
Lab policy is that they automatically get a 3-hour if 1-hr glucose is above 135. Patient was informed of this at last appointment and 3-hour was scheduled for later this week. Will check A1c when patient comes for 3-hr.

## 2015-04-28 NOTE — Telephone Encounter (Signed)
Called patient to discuss early glucose tolerance test.  Result is elevated at 148.  Patient needs to have a1c checked to confirm whether or not she has diabetes.  If a1c is not elevated, then would do 3hr GTT.  Left VM asking to call back to clinic to discuss.  Please relay above when she calls back and have her set lab f/u appt.  Erasmo DownerAngela M Adaiah Jaskot, MD, MPH PGY-2,  [facility] 04/28/2015 8:58 AM

## 2015-04-29 ENCOUNTER — Telehealth: Payer: Self-pay | Admitting: Family Medicine

## 2015-04-29 NOTE — Telephone Encounter (Signed)
Called patient to discuss lab results.  GC/CT neg.  1h GTT elevated.  [facility] for 3h GTT and A1c.  [facility] if tests elevated indicating T2DM.  Erasmo DownerAngela M Verdean Murin, MD, MPH PGY-2,  [facility] 04/29/2015 8:56 AM

## 2015-04-30 ENCOUNTER — Ambulatory Visit (HOSPITAL_COMMUNITY)
Admission: RE | Admit: 2015-04-30 | Discharge: 2015-04-30 | Disposition: A | Discharge status: Home / Self Care | Payer: Medicaid Other | Source: Ambulatory Visit | Attending: Family Medicine | Admitting: Family Medicine

## 2015-04-30 ENCOUNTER — Telehealth: Payer: Self-pay | Admitting: Family Medicine

## 2015-04-30 DIAGNOSIS — O208 Other hemorrhage in early pregnancy: Secondary | ICD-10-CM | POA: Diagnosis not present

## 2015-04-30 DIAGNOSIS — O020 Blighted ovum and nonhydatidiform mole: Secondary | ICD-10-CM

## 2015-04-30 DIAGNOSIS — Z3491 Encounter for supervision of normal pregnancy, unspecified, first trimester: Secondary | ICD-10-CM

## 2015-04-30 DIAGNOSIS — Z3A01 Less than 8 weeks gestation of pregnancy: Secondary | ICD-10-CM | POA: Insufficient documentation

## 2015-04-30 DIAGNOSIS — Z36 Encounter for antenatal screening of mother: Secondary | ICD-10-CM | POA: Insufficient documentation

## 2015-04-30 NOTE — Telephone Encounter (Signed)
Pt is calling for her US results. jw

## 2015-04-30 NOTE — Telephone Encounter (Signed)
Will forward to PCP and ordering MD. 

## 2015-05-01 ENCOUNTER — Other Ambulatory Visit: Payer: Self-pay

## 2015-05-01 NOTE — Telephone Encounter (Signed)
Patient came in today for her 3hr GTT and A1c. I am precepting this morning and was asked to speak with her about her ultrasound results.  U/S showed gestational sac but embryo or yolk sac visualized, suspicious for failed pregnancy. Needs follow up ultrasound in 10-14 days to confirm either viability or failed pregnancy.  I sat with patient and shared these results with her. She is disappointed, as this was a pregnancy she was hoping for, and she had a miscarriage earlier this year. She elected not to proceed with the 3hr GTT today. She'll wait until she gets the follow up ultrasound and then reschedule the lab visit if necessary.  Ultrasound ordered and scheduled. FYI to PCP & OB MD.  Erin DodrillBrittany J McIntyre, MD

## 2015-05-01 NOTE — Telephone Encounter (Signed)
Appreciate Dr. Pollie MeyerMcIntyre discussing results with patient.  Was going to call this AM with results.  Erasmo DownerAngela M Bacigalupo, MD, MPH PGY-2,  [facility] 05/01/2015 9:02 AM

## 2015-05-09 ENCOUNTER — Inpatient Hospital Stay (HOSPITAL_COMMUNITY)
Admission: AD | Admit: 2015-05-09 | Discharge: 2015-05-10 | Disposition: A | Discharge status: Home / Self Care | Payer: Medicaid Other | Source: Ambulatory Visit | Attending: Family Medicine | Admitting: Family Medicine

## 2015-05-09 ENCOUNTER — Inpatient Hospital Stay (HOSPITAL_COMMUNITY): Payer: Medicaid Other

## 2015-05-09 ENCOUNTER — Encounter (HOSPITAL_COMMUNITY): Payer: Self-pay | Admitting: *Deleted

## 2015-05-09 DIAGNOSIS — O020 Blighted ovum and nonhydatidiform mole: Secondary | ICD-10-CM | POA: Diagnosis not present

## 2015-05-09 DIAGNOSIS — O021 Missed abortion: Secondary | ICD-10-CM

## 2015-05-09 DIAGNOSIS — O209 Hemorrhage in early pregnancy, unspecified: Secondary | ICD-10-CM | POA: Diagnosis present

## 2015-05-09 LAB — URINALYSIS, ROUTINE W REFLEX MICROSCOPIC
Bilirubin Urine: NEGATIVE
GLUCOSE, UA: NEGATIVE mg/dL
Ketones, ur: NEGATIVE mg/dL
Nitrite: NEGATIVE
PROTEIN: NEGATIVE mg/dL
SPECIFIC GRAVITY, URINE: 1.025 (ref 1.005–1.030)
pH: 6.5 (ref 5.0–8.0)

## 2015-05-09 LAB — HCG, QUANTITATIVE, PREGNANCY: hCG, Beta Chain, Quant, S: 3449 m[IU]/mL — ABNORMAL HIGH (ref <5)

## 2015-05-09 LAB — URINE MICROSCOPIC-ADD ON

## 2015-05-09 LAB — CBC
HCT: 34 % — ABNORMAL LOW (ref 36.0–46.0)
Hemoglobin: 11.5 g/dL — ABNORMAL LOW (ref 12.0–15.0)
MCH: 29.6 pg (ref 26.0–34.0)
MCHC: 33.8 g/dL (ref 30.0–36.0)
MCV: 87.6 fL (ref 78.0–100.0)
Platelets: 267 10*3/uL (ref 150–400)
RBC: 3.88 MIL/uL (ref 3.87–5.11)
RDW: 14.3 % (ref 11.5–15.5)
WBC: 10.6 10*3/uL — AB (ref 4.0–10.5)

## 2015-05-09 NOTE — MAU Provider Note (Signed)
History     CSN: [id number]  Arrival date and time: 05/09/15 4098   First Provider Initiated Contact with Patient 05/09/15 [id number]      Chief Complaint  Patient presents with  . Vaginal Bleeding   HPI Ms. Maxene R Lio is a 34 y.o. [id number] at [redacted]w[redacted]d who presents to MAU today with complaint of vaginal bleeding. The patients states that bleeding started tonight. Initially it was heavy but now is very light. She denies passing clots. She states mild lower abdominal cramping now and rates pain at 4/10. She denies taking anything for pain. She also denies fever. The patient was seen for an initial prenatal visit at [facility]. She had [place] ordered on 04/30/15 which showed IUGS without YS or FP. She was told to come to MAU for abdominal pain and/or vaginal bleeding. She has repeat US scheduled on 12/1 and follow-up with [facility] on 12/12.   OB History    This patient's OB History needs to be verified. Open OB History to review and resolve any issues.   Gravida Para Term Preterm AB TAB SAB Ectopic Multiple Living   0 2      Past Medical History  Diagnosis Date  . SVD (spontaneous vaginal delivery)     x 2  . Migraines     otc med prn    Past Surgical History  Procedure Laterality Date  . Cholecystectomy      Gallstones removed-Around 34 years of age  . Dilation and curettage of uterus N/A 10/18/2014    Procedure: SUCTION DILATATION AND CURETTAGE;  Surgeon: Adam Phenix, MD;  Location: [facility];  Service: Gynecology;  Laterality: N/A;    Family History  Problem Relation Age of Onset  . Hypertension Mother   . Diabetes Mother   . Hypertension Maternal Grandmother   . Diabetes Maternal Grandmother   . Acute myelogenous leukemia Neg Hx   . Alcohol abuse Father   . Drug abuse Father   . Hypertension Sister   . Drug abuse Brother   . Alcohol abuse Brother     Social History  Substance Use Topics  . Smoking status: Never Smoker   . Smokeless tobacco: Never Used  . Alcohol  Use: No    Allergies: No Known Allergies  Prescriptions prior to admission  Medication Sig Dispense Refill Last Dose  . Prenatal Vit-Fe Fumarate-FA (PRENATAL VITAMINS) 28-0.8 MG TABS Take 1 tablet by mouth daily. 30 tablet 11 Past Week at Unknown time  . vitamin B-6 (PYRIDOXINE) 25 MG tablet Take 1 tablet (25 mg total) by mouth 3 (three) times daily. (Patient not taking: Reported on 05/09/2015) 90 tablet 3 Not Taking at Unknown time    Review of Systems  Constitutional: Negative for fever and malaise/fatigue.  Gastrointestinal: Positive for abdominal pain. Negative for nausea, vomiting, diarrhea and constipation.   Physical Exam   Blood pressure 137/86, pulse 72, temperature 98.5 F (36.9 C), resp. rate 18, height 5' 1.5" (1.562 m), weight 204 lb 6.4 oz (92.715 kg), last menstrual period 02/27/2015, unknown if currently breastfeeding.  Physical Exam  Nursing note and vitals reviewed. Constitutional: She is oriented to person, place, and time. She appears well-developed and well-nourished. No distress.  HENT:  Head: Normocephalic and atraumatic.  Cardiovascular: Normal rate.   Respiratory: Effort normal.  GI: Soft. She exhibits no distension and no mass. There is no tenderness. There is no rebound and no guarding.  Genitourinary: Uterus is not  enlarged (exam limited by maternal body habitus) and not tender. Cervix exhibits no motion tenderness, no discharge and no friability. Right adnexum displays no mass and no tenderness. Left adnexum displays no mass and no tenderness. There is bleeding (scant blood noted) in the vagina. No vaginal discharge found.  Neurological: She is alert and oriented to person, place, and time.  Skin: Skin is warm and dry. No erythema.  Psychiatric: She has a normal mood and affect.    Results for orders placed or performed during the hospital encounter of 05/09/15 (from the past 24 hour(s))  Urinalysis, Routine w reflex microscopic (not at [facility])      Status: Abnormal   Collection Time: 05/09/15  7:25 PM  Result Value Ref Range   Color, Urine YELLOW YELLOW   APPearance CLEAR CLEAR   Specific Gravity, Urine 1.025 1.005 - 1.030   pH 6.5 5.0 - 8.0   Glucose, UA NEGATIVE NEGATIVE mg/dL   Hgb urine dipstick LARGE (A) NEGATIVE   Bilirubin Urine NEGATIVE NEGATIVE   Ketones, ur NEGATIVE NEGATIVE mg/dL   Protein, ur NEGATIVE NEGATIVE mg/dL   Nitrite NEGATIVE NEGATIVE   Leukocytes, UA SMALL (A) NEGATIVE  Urine microscopic-add on     Status: Abnormal   Collection Time: 05/09/15  7:25 PM  Result Value Ref Range   Squamous Epithelial / LPF 0-5 (A) NONE SEEN   WBC, UA 0-5 0 - 5 WBC/hpf   RBC / HPF 0-5 0 - 5 RBC/hpf   Bacteria, UA FEW (A) NONE SEEN  CBC     Status: Abnormal   Collection Time: 05/09/15  8:55 PM  Result Value Ref Range   WBC 10.6 (H) 4.0 - 10.5 K/uL   RBC 3.88 3.87 - 5.11 MIL/uL   Hemoglobin 11.5 (L) 12.0 - 15.0 g/dL   HCT 16.1 (L) 09.6 - 04.5 %   MCV 87.6 78.0 - 100.0 fL   MCH 29.6 26.0 - 34.0 pg   MCHC 33.8 30.0 - 36.0 g/dL   RDW 40.9 81.1 - 91.4 %   Platelets 267 150 - 400 K/uL  hCG, quantitative, pregnancy     Status: Abnormal   Collection Time: 05/09/15  8:55 PM  Result Value Ref Range   hCG, Beta Chain, Quant, S 3449 (H) <5 mIU/mL   US Ob Transvaginal  05/09/2015  CLINICAL DATA:  Viability. Uncertain dating. Estimated gestational age by LMP is 8 weeks 6 days. Quantitative beta HCG is not reported. EXAM: OBSTETRIC <14 WK [place] AND TRANSVAGINAL OB US TECHNIQUE: Both transabdominal and transvaginal ultrasound examinations were performed for complete evaluation of the gestation as well as the maternal uterus, adnexal regions, and pelvic cul-de-sac. Transvaginal technique was performed to assess early pregnancy. COMPARISON:  04/30/2015 FINDINGS: Intrauterine gestational sac: A single intrauterine gestational sac is present. Yolk sac:  Yolk sac is not identified. Embryo:  Fetal pole is not identified. Cardiac Activity: Not  identified. MSD: 21 mm 7 w 0 d, unchanged in size since previous study. The finding of a gestational sac with mean sac diameter less than 25 mm and demonstrating no embryo between 1-2 weeks after an ultrasound demonstrating a gestational sac without yolk sac meets criteria for category of suspicious but not yet definitive for failed pregnancy. Maternal uterus/adnexae: Moderate size subchorionic hemorrhage. Small amount of free fluid in the endocervical region. Both ovaries are visualized and appear normal. No free pelvic fluid collections. IMPRESSION: Intrauterine gestational sac is present with mean sac diameter 21 mm. No fetal pole or yolk  sac is demonstrated. Moderate subchorionic hemorrhage. Findings are suspicious but not yet definitive for failed pregnancy. Recommend follow-up US in 10-14 days for definitive diagnosis. This recommendation follows SRU consensus guidelines: Diagnostic Criteria for Nonviable Pregnancy Early in the First Trimester. Malva Limes Engl J Med 2013; 098:1191-47; 369:1443-51. Electronically Signed   By: Burman NievesWilliam  Stevens M.D.   On: 05/09/2015 23:53    MAU Course  Procedures None  MDM Quant hCG not drawn at previous visit as this was not in MAU. Patient had US ordered by MD at [facility] Hgb stable today. Bleeding scant on exam US ordered to re-evaluate pregnancy development or lack of development Discussed patient and US results with Dr. Shawnie PonsPratt. Agrees with plan to offer Cytotec at this time.  Discussed options of expectant management vs Cytotec with the patient. She would like to try Cytotec at this time if we will place pills vaginally in MAU prior to discharge.   Early Intrauterine Pregnancy Failure  _x__  Documented intrauterine pregnancy failure less than or equal to [redacted] weeks gestation  _x__  No serious current illness  __x_  Baseline Hgb greater than or equal to 10g/dl  _x__  Patient has easily accessible transportation to the hospital  _x__  Clear preference  _x__   Practitioner/physician deems patient reliable  _x__  Counseling by practitioner or physician  __x_  Patient education by RN  _x__  Consent form signed  _N/A__  Rho-Gam given by RN if indicated  _x_ Medication dispensed   __x_   Cytotec 800 mcg  __   Intravaginally by patient at home         _x_   Intravaginally by RN in MAU        __   Rectally by patient at home        __   Rectally by RN in MAU  _x__  Ibuprofen 600 mg 1 tablet by mouth every 6 hours as needed #30  _x__  Hydrocodone/acetaminophen 5/325 mg by mouth every 4 to 6 hours as needed  _declined__  Phenergan 12.5 mg by mouth every 4 hours as needed for nausea    Assessment and Plan  A: Blighted ovum  P: Discharge home Rx for Percocet and Ibuprofen given to patient Cytotec administered in MAU today Bleeding precautions discussed Patient advised to follow-up with [facility] in 1 week for labs. They will call the patient with an appointment time.  Patient may return to MAU as needed or if her condition were to change or worsen  Marny LowensteinJulie N Assyria Morreale, PA-C  05/10/2015, 12:21 AM

## 2015-05-09 NOTE — MAU Note (Signed)
Was seen here last wk and told I was pregnant but the sac was empty. Told to return with heavy bleeding. Bleeding the last hour and used one pad

## 2015-05-10 DIAGNOSIS — O021 Missed abortion: Secondary | ICD-10-CM | POA: Diagnosis not present

## 2015-05-10 MED ORDER — IBUPROFEN 600 MG PO TABS: 600.0000 mg | ORAL_TABLET | Freq: Four times a day (QID) | ORAL | Status: DC | PRN

## 2015-05-10 MED ORDER — MISOPROSTOL 200 MCG PO TABS
800.0000 ug | ORAL_TABLET | Freq: Once | ORAL | Status: AC
  Administered 2015-05-10: 800 ug via VAGINAL
  Filled 2015-05-10: qty 4

## 2015-05-10 MED ORDER — OXYCODONE-ACETAMINOPHEN 5-325 MG PO TABS: 1.0000 | ORAL_TABLET | Freq: Four times a day (QID) | ORAL | Status: DC | PRN

## 2015-05-10 NOTE — Discharge Instructions (Signed)
Incomplete Miscarriage A miscarriage is the sudden loss of an unborn baby (fetus) before the 20th week of pregnancy. In an incomplete miscarriage, parts of the fetus or placenta (afterbirth) remain in the body.  Having a miscarriage can be an emotional experience. Talk with your health care provider about any questions you may have about miscarrying, the grieving process, and your future pregnancy plans. CAUSES   Problems with the fetal chromosomes that make it impossible for the baby to develop normally. Problems with the baby's genes or chromosomes are most often the result of errors that occur by chance as the embryo divides and grows. The problems are not inherited from the parents.  Infection of the cervix or uterus.  Hormone problems.  Problems with the cervix, such as having an incompetent cervix. This is when the tissue in the cervix is not strong enough to hold the pregnancy.  Problems with the uterus, such as an abnormally shaped uterus, uterine fibroids, or congenital abnormalities.  Certain medical conditions.  Smoking, drinking alcohol, or taking illegal drugs.  Trauma. SYMPTOMS   Vaginal bleeding or spotting, with or without cramps or pain.  Pain or cramping in the abdomen or lower back.  Passing fluid, tissue, or blood clots from the vagina. DIAGNOSIS  Your health care provider will perform a physical exam. You may also have an ultrasound to confirm the miscarriage. Blood or urine tests may also be ordered. TREATMENT   Usually, a dilation and curettage (D&C) procedure is performed. During a D&C procedure, the cervix is widened (dilated) and any remaining fetal or placental tissue is gently removed from the uterus.  Antibiotic medicines are prescribed if there is an infection. Other medicines may be given to reduce the size of the uterus (contract) if there is a lot of bleeding.  If you have Rh negative blood and your baby was Rh positive, you will need a Rho (D)  immune globulin shot. This shot will protect any future baby from having Rh blood problems in future pregnancies.  You may be confined to bed rest. This means you should stay in bed and only get up to use the bathroom. HOME CARE INSTRUCTIONS   Rest as directed by your health care provider.  Restrict activity as directed by your health care provider. You may be allowed to continue light activity if curettage was not done but you require further treatment.  Keep track of the number of pads you use each day. Keep track of how soaked (saturated) they are. Record this information.  Do not  use tampons.  Do not douche or have sexual intercourse until approved by your health care provider.  Keep all follow-up appointments for reevaluation and continuing management.  Only take over-the-counter or prescription medicines for pain, fever, or discomfort as directed by your health care provider.  Take antibiotic medicine as directed by your health care provider. Make sure you finish it even if you start to feel better. SEEK IMMEDIATE MEDICAL CARE IF:   You experience severe cramps in your stomach, back, or abdomen.  You have an unexplained temperature (make sure to record these temperatures).  You pass large clots or tissue (save these for your health care provider to inspect).  Your bleeding increases.  You become light-headed, weak, or have fainting episodes. MAKE SURE YOU:   Understand these instructions.  Will watch your condition.  Will get help right away if you are not doing well or get worse.   This information is not intended to   replace advice given to you by your health care provider. Make sure you discuss any questions you have with your health care provider.   Document Released: 05/31/2005 Document Revised: 06/21/2014 Document Reviewed: 12/28/2012 Elsevier Interactive Patient Education 2016 [employer]  

## 2015-05-11 ENCOUNTER — Inpatient Hospital Stay (HOSPITAL_COMMUNITY)
Admission: AD | Admit: 2015-05-11 | Discharge: 2015-05-11 | Disposition: A | Discharge status: Home / Self Care | Payer: Medicaid Other | Source: Ambulatory Visit | Attending: Obstetrics & Gynecology | Admitting: Obstetrics & Gynecology

## 2015-05-11 ENCOUNTER — Encounter (HOSPITAL_COMMUNITY): Payer: Self-pay | Admitting: *Deleted

## 2015-05-11 DIAGNOSIS — Z3A10 10 weeks gestation of pregnancy: Secondary | ICD-10-CM | POA: Diagnosis not present

## 2015-05-11 DIAGNOSIS — O034 Incomplete spontaneous abortion without complication: Secondary | ICD-10-CM | POA: Insufficient documentation

## 2015-05-11 DIAGNOSIS — N939 Abnormal uterine and vaginal bleeding, unspecified: Secondary | ICD-10-CM | POA: Diagnosis not present

## 2015-05-11 LAB — CBC
HEMATOCRIT: 34 % — AB (ref 36.0–46.0)
Hemoglobin: 11.5 g/dL — ABNORMAL LOW (ref 12.0–15.0)
MCH: 29.3 pg (ref 26.0–34.0)
MCHC: 33.8 g/dL (ref 30.0–36.0)
MCV: 86.5 fL (ref 78.0–100.0)
Platelets: 273 10*3/uL (ref 150–400)
RBC: 3.93 MIL/uL (ref 3.87–5.11)
RDW: 14.1 % (ref 11.5–15.5)
WBC: 12.2 10*3/uL — ABNORMAL HIGH (ref 4.0–10.5)

## 2015-05-11 LAB — HCG, QUANTITATIVE, PREGNANCY: HCG, BETA CHAIN, QUANT, S: 2036 m[IU]/mL — AB (ref <5)

## 2015-05-11 MED ORDER — IBUPROFEN 600 MG PO TABS
600.0000 mg | ORAL_TABLET | Freq: Once | ORAL | Status: AC
  Administered 2015-05-11: 600 mg via ORAL
  Filled 2015-05-11: qty 1

## 2015-05-11 MED ORDER — OXYCODONE-ACETAMINOPHEN 5-325 MG PO TABS
2.0000 | ORAL_TABLET | Freq: Once | ORAL | Status: AC
  Administered 2015-05-11: 2 via ORAL
  Filled 2015-05-11: qty 2

## 2015-05-11 NOTE — MAU Note (Signed)
Pt presents to MAU with complaints of heavy vaginal bleeding with large blood clots. Cytotec given on Friday for miscarriage Lower abdominal cramping

## 2015-05-11 NOTE — Discharge Instructions (Signed)
Miscarriage  A miscarriage is the sudden loss of an unborn baby (fetus) before the 20th week of pregnancy. Most miscarriages happen in the first 3 months of pregnancy. Sometimes, it happens before a woman even knows she is pregnant. A miscarriage is also called a "spontaneous miscarriage" or "early pregnancy loss." Having a miscarriage can be an emotional experience. Talk with your caregiver about any questions you may have about miscarrying, the grieving process, and your future pregnancy plans.  CAUSES    Problems with the fetal chromosomes that make it impossible for the baby to develop normally. Problems with the baby's genes or chromosomes are most often the result of errors that occur, by chance, as the embryo divides and grows. The problems are not inherited from the parents.   Infection of the cervix or uterus.    Hormone problems.    Problems with the cervix, such as having an incompetent cervix. This is when the tissue in the cervix is not strong enough to hold the pregnancy.    Problems with the uterus, such as an abnormally shaped uterus, uterine fibroids, or congenital abnormalities.    Certain medical conditions.    Smoking, drinking alcohol, or taking illegal drugs.    Trauma.   Often, the cause of a miscarriage is unknown.   SYMPTOMS    Vaginal bleeding or spotting, with or without cramps or pain.   Pain or cramping in the abdomen or lower back.   Passing fluid, tissue, or blood clots from the vagina.  DIAGNOSIS   Your caregiver will perform a physical exam. You may also have an ultrasound to confirm the miscarriage. Blood or urine tests may also be ordered.  TREATMENT    Sometimes, treatment is not necessary if you naturally pass all the fetal tissue that was in the uterus. If some of the fetus or placenta remains in the body (incomplete miscarriage), tissue left behind may become infected and must be removed. Usually, a dilation and curettage (D and C) procedure is performed.  During a D and C procedure, the cervix is widened (dilated) and any remaining fetal or placental tissue is gently removed from the uterus.   Antibiotic medicines are prescribed if there is an infection. Other medicines may be given to reduce the size of the uterus (contract) if there is a lot of bleeding.   If you have Rh negative blood and your baby was Rh positive, you will need a Rh immunoglobulin shot. This shot will protect any future baby from having Rh blood problems in future pregnancies.  HOME CARE INSTRUCTIONS    Your caregiver may order bed rest or may allow you to continue light activity. Resume activity as directed by your caregiver.   Have someone help with home and family responsibilities during this time.    Keep track of the number of sanitary pads you use each day and how soaked (saturated) they are. Write down this information.    Do not use tampons. Do not douche or have sexual intercourse until approved by your caregiver.    Only take over-the-counter or prescription medicines for pain or discomfort as directed by your caregiver.    Do not take aspirin. Aspirin can cause bleeding.    Keep all follow-up appointments with your caregiver.    If you or your partner have problems with grieving, talk to your caregiver or seek counseling to help cope with the pregnancy loss. Allow enough time to grieve before trying to get pregnant again.     SEEK IMMEDIATE MEDICAL CARE IF:    You have severe cramps or pain in your back or abdomen.   You have a fever.   You pass large blood clots (walnut-sized or larger) ortissue from your vagina. Save any tissue for your caregiver to inspect.    Your bleeding increases.    You have a thick, bad-smelling vaginal discharge.   You become lightheaded, weak, or you faint.    You have chills.   MAKE SURE YOU:   Understand these instructions.   Will watch your condition.   Will get help right away if you are not doing well or get worse.     This  information is not intended to replace advice given to you by your health care provider. Make sure you discuss any questions you have with your health care provider.     Document Released: 11/24/2000 Document Revised: 09/25/2012 Document Reviewed: 07/20/2011  Elsevier Interactive Patient Education 2016 [employer]

## 2015-05-11 NOTE — MAU Provider Note (Signed)
Chief Complaint: Vaginal Bleeding   First Provider Initiated Contact with Patient 05/11/15 0906      SUBJECTIVE  Erin Osborn is a 34 y.o. [id number] at [redacted]w[redacted]d by LMP who presents to maternity admissions reporting Heavy bleeding and passage of clots. Was given Cytotec on Friday and has had a lot of cramping and bleeding. Worried because it has not stopped. . She denies vaginal itching/burning, urinary symptoms, h/a, dizziness, n/v, or fever/chills.    RN Note: Pt presents to MAU with complaints of heavy vaginal bleeding with large blood clots. Cytotec given on Friday for miscarriage Lower abdominal cramping         Vaginal Bleeding The patient's primary symptoms include pelvic pain and vaginal bleeding. The patient's pertinent negatives include no genital itching, genital lesions or genital odor. This is a new problem. The current episode started in the past 7 days. The problem occurs constantly. The problem has been unchanged. The pain is moderate. The problem affects both sides. She is pregnant. Associated symptoms include abdominal pain and back pain. Pertinent negatives include no chills, constipation, diarrhea, dysuria, fever, frequency, headaches, nausea or vomiting. The vaginal discharge was bloody. The vaginal bleeding is heavier than menses. She has been passing clots. She has been passing tissue. Nothing aggravates the symptoms. She has tried NSAIDs for the symptoms. The treatment provided no relief.   Past Medical History  Diagnosis Date  . SVD (spontaneous vaginal delivery)     x 2  . Migraines     otc med prn   Past Surgical History  Procedure Laterality Date  . Cholecystectomy      Gallstones removed-Around 34 years of age  . Dilation and curettage of uterus N/A 10/18/2014    Procedure: SUCTION DILATATION AND CURETTAGE;  Surgeon: Adam Phenix, MD;  Location: [facility];  Service: Gynecology;  Laterality: N/A;   Social History   Social History  . Marital Status: Single     Spouse Name: N/A  . Number of Children: N/A  . Years of Education: N/A   Occupational History  . Not on file.   Social History Main Topics  . Smoking status: Never Smoker   . Smokeless tobacco: Never Used  . Alcohol Use: No  . Drug Use: No  . Sexual Activity: Yes    Birth Control/ Protection: None   Other Topics Concern  . Not on file   Social History Narrative   No current facility-administered medications on file prior to encounter.   Current Outpatient Prescriptions on File Prior to Encounter  Medication Sig Dispense Refill  . ibuprofen (ADVIL,MOTRIN) 600 MG tablet Take 1 tablet (600 mg total) by mouth every 6 (six) hours as needed. 30 tablet 0  . oxyCODONE-acetaminophen (PERCOCET/ROXICET) 5-325 MG tablet Take 1-2 tablets by mouth every 6 (six) hours as needed for severe pain. 15 tablet 0  . Prenatal Vit-Fe Fumarate-FA (PRENATAL VITAMINS) 28-0.8 MG TABS Take 1 tablet by mouth daily. (Patient not taking: Reported on 05/11/2015) 30 tablet 11   No Known Allergies  ROS:  Review of Systems  Constitutional: Negative for fever and chills.  Gastrointestinal: Positive for abdominal pain. Negative for nausea, vomiting, diarrhea and constipation.  Genitourinary: Positive for vaginal bleeding and pelvic pain. Negative for dysuria and frequency.  Musculoskeletal: Positive for back pain.  Neurological: Negative for dizziness and headaches.   Review of Systems  Constitutional: Negative for fever and chills.  Gastrointestinal: Negative for nausea, vomiting, abdominal pain, diarrhea and constipation.  Genitourinary: Negative for  dysuria.  Musculoskeletal: Negative for back pain.  Neurological: Negative for dizziness and weakness.    I have reviewed patient's Past Medical Hx, Surgical Hx, Family Hx, Social Hx, medications and allergies.   Physical Exam  Patient Vitals for the past 24 hrs:  BP Temp Pulse Resp  05/11/15 0742 131/86 mmHg 97.9 F (36.6 C) 98 18    Constitutional: Well-developed, well-nourished female in no acute distress.  Cardiovascular: normal rate and rhythm Respiratory: normal effort, no distress GI: Abd soft, non-tender. Pos BS x 4 MS: Extremities nontender, no edema, normal ROM Neurologic: Alert and oriented x 4.  GU: Neg CVAT.  PELVIC EXAM: Cervix pink, slightly open,  without lesion, vaginal walls and external genitalia normal, several golfball sized clots in vagina, removed with sponge stick, no active bleeding from cervix Bimanual exam: Cervix FT/long/high, firm, anterior, neg CMT, uterus nontender, nonenlarged, adnexa without tenderness, enlargement, or mass  Physical Exam LAB RESULTS Results for orders placed or performed during the hospital encounter of 05/11/15 (from the past 24 hour(s))  hCG, quantitative, pregnancy     Status: Abnormal   Collection Time: 05/11/15  7:50 AM  Result Value Ref Range   hCG, Beta Chain, Quant, S 2036 (H) <5 mIU/mL  CBC     Status: Abnormal   Collection Time: 05/11/15  7:50 AM  Result Value Ref Range   WBC 12.2 (H) 4.0 - 10.5 K/uL   RBC 3.93 3.87 - 5.11 MIL/uL   Hemoglobin 11.5 (L) 12.0 - 15.0 g/dL   HCT 54.034.0 (L) 98.136.0 - 19.146.0 %   MCV 86.5 78.0 - 100.0 fL   MCH 29.3 26.0 - 34.0 pg   MCHC 33.8 30.0 - 36.0 g/dL   RDW 47.814.1 29.511.5 - 62.115.5 %   Platelets 273 150 - 400 K/uL     Ref. Range 05/09/2015 20:55  HCG, Beta Chain, Quant, S Latest Ref Range: <5 mIU/mL 3449 (H)   O/POS/-- (11/04 1012)  IMAGING [place] Ob Comp Less 14 Wks  04/30/2015  CLINICAL DATA:  Check fetal viability, uncertain dating EXAM: OBSTETRIC <14 WK US AND TRANSVAGINAL OB US TECHNIQUE: Both transabdominal and transvaginal ultrasound examinations were performed for complete evaluation of the gestation as well as the maternal uterus, adnexal regions, and pelvic cul-de-sac. Transvaginal technique was performed to assess early pregnancy. COMPARISON:  None. FINDINGS: Intrauterine gestational sac: Visualized/normal in shape.  Yolk sac:  Not visualized Embryo:  Not visualized MSD: 21  mm   7 w   0  d Maternal uterus/adnexae: Moderate subchorionic hemorrhage is identified. The ovaries are within normal limits. IMPRESSION: Gestational sac is present with evidence of subchorionic hemorrhage. No definitive embryo is identified. Findings are suspicious but not yet definitive for failed pregnancy. Recommend follow-up US in 10-14 days for definitive diagnosis. This recommendation follows SRU consensus guidelines: Diagnostic Criteria for Nonviable Pregnancy Early in the First Trimester. Malva Limes Engl J Med 2013; 308:6578-46; 369:1443-51. Electronically Signed   By: Alcide CleverMark  Lukens M.D.   On: 04/30/2015 10:57   [place] Ob Transvaginal  05/09/2015  CLINICAL DATA:  Viability. Uncertain dating. Estimated gestational age by LMP is 8 weeks 6 days. Quantitative beta HCG is not reported. EXAM: OBSTETRIC <14 WK US AND TRANSVAGINAL OB US TECHNIQUE: Both transabdominal and transvaginal ultrasound examinations were performed for complete evaluation of the gestation as well as the maternal uterus, adnexal regions, and pelvic cul-de-sac. Transvaginal technique was performed to assess early pregnancy. COMPARISON:  04/30/2015 FINDINGS: Intrauterine gestational sac: A single intrauterine gestational sac is present. Yolk  sac:  Yolk sac is not identified. Embryo:  Fetal pole is not identified. Cardiac Activity: Not identified. MSD: 21 mm 7 w 0 d, unchanged in size since previous study. The finding of a gestational sac with mean sac diameter less than 25 mm and demonstrating no embryo between 1-2 weeks after an ultrasound demonstrating a gestational sac without yolk sac meets criteria for category of suspicious but not yet definitive for failed pregnancy. Maternal uterus/adnexae: Moderate size subchorionic hemorrhage. Small amount of free fluid in the endocervical region. Both ovaries are visualized and appear normal. No free pelvic fluid collections. IMPRESSION: Intrauterine gestational  sac is present with mean sac diameter 21 mm. No fetal pole or yolk sac is demonstrated. Moderate subchorionic hemorrhage. Findings are suspicious but not yet definitive for failed pregnancy. Recommend follow-up US in 10-14 days for definitive diagnosis. This recommendation follows SRU consensus guidelines: Diagnostic Criteria for Nonviable Pregnancy Early in the First Trimester. Malva Limes Med 2013; 161:0960-45. Electronically Signed   By: Burman Nieves M.D.   On: 05/09/2015 23:53   US Ob Transvaginal  04/30/2015  CLINICAL DATA:  Check fetal viability, uncertain dating EXAM: OBSTETRIC <14 WK [place] AND TRANSVAGINAL OB US TECHNIQUE: Both transabdominal and transvaginal ultrasound examinations were performed for complete evaluation of the gestation as well as the maternal uterus, adnexal regions, and pelvic cul-de-sac. Transvaginal technique was performed to assess early pregnancy. COMPARISON:  None. FINDINGS: Intrauterine gestational sac: Visualized/normal in shape. Yolk sac:  Not visualized Embryo:  Not visualized MSD: 21  mm   7 w   0  d Maternal uterus/adnexae: Moderate subchorionic hemorrhage is identified. The ovaries are within normal limits. IMPRESSION: Gestational sac is present with evidence of subchorionic hemorrhage. No definitive embryo is identified. Findings are suspicious but not yet definitive for failed pregnancy. Recommend follow-up US in 10-14 days for definitive diagnosis. This recommendation follows SRU consensus guidelines: Diagnostic Criteria for Nonviable Pregnancy Early in the First Trimester. Malva Limes Med 2013; 409:8119-14. Electronically Signed   By: Alcide Clever M.D.   On: 04/30/2015 10:57    MAU Management/MDM: Ordered labs and reviewed results.   Treatments in MAU included Removal of clots and tissue from vagina.  Pt stable at time of discharge.  ASSESSMENT Pregnancy at [redacted]w[redacted]d  Incomplete Spontaneous Abortion Stable hemoglobin Declining Quant HCG level  PLAN Discharge  home Bleeding precautions Rx given for ibuprofen and percocet Follow up in clinic in one week for Quant HCG (around Dec 5)    Medication List    ASK your doctor about these medications        ibuprofen 600 MG tablet  Commonly known as:  ADVIL,MOTRIN  Take 1 tablet (600 mg total) by mouth every 6 (six) hours as needed.     oxyCODONE-acetaminophen 5-325 MG tablet  Commonly known as:  PERCOCET/ROXICET  Take 1-2 tablets by mouth every 6 (six) hours as needed for severe pain.     Prenatal Vitamins 28-0.8 MG Tabs  Take 1 tablet by mouth daily.         Wynelle Bourgeois CNM, MSN Certified Nurse-Midwife 05/11/2015  9:19 AM

## 2015-05-14 ENCOUNTER — Encounter: Payer: Self-pay | Admitting: Family Medicine

## 2015-05-14 ENCOUNTER — Ambulatory Visit (INDEPENDENT_AMBULATORY_CARE_PROVIDER_SITE_OTHER): Payer: Medicaid Other | Admitting: Family Medicine

## 2015-05-14 ENCOUNTER — Telehealth: Payer: Self-pay | Admitting: Family Medicine

## 2015-05-14 VITALS — BP 129/80 | HR 82 | Temp 98.1°F | Ht 61.5 in | Wt 198.8 lb

## 2015-05-14 DIAGNOSIS — K645 Perianal venous thrombosis: Secondary | ICD-10-CM

## 2015-05-14 MED ORDER — HYDROCORTISONE ACE-PRAMOXINE 1-1 % RE FOAM: RECTAL | Status: DC

## 2015-05-14 NOTE — Telephone Encounter (Signed)
Return call to patient regarding hemorrhoids.  Patient stated she had a miscarriage this past Friday and now having a lot of bleeding from that.  She has tried Preparation H, sitz bath, cold water, heating pad and other relief methods but nothing is helping.  Patient was given Percocet and Ibuprofen from her visit on Friday.  Advised patient the only other option is for her to been seen.  If she has tried all other methods, there is nothing else the nurse could tell her to do at home at this point.  Appointment made for 11 AM. Precept with Dr. Pollie MeyerMcIntyre, agreed with plan.  Clovis PuMartin, Tamika L, RN

## 2015-05-14 NOTE — Progress Notes (Signed)
   Subjective:    Patient ID: Erin Osborn, female    DOB: 11-30-1980, 34 y.o.   MRN: [id number]  HPI  3 days of rectal pain. No bleeding. Unfortunately she had a nonviable pregnancy and was treated with cytotech last Friday and has been having fair amount of vaginal bleeding since then. Denies constipation. Has never had problem this severe with hemorrhoids before but has had some milder type pain from known hemorrhoids.  Review of Systems No abdominal pain, no constipation, no nausea, fever, sweats, chills.    Objective:   Physical Exam Vital signs are reviewed GEN.: Well-developed female, overweight, no acute distress RECTAL: Rectal exam reveals a 1 cm in diameter swollen thrombosed hemorrhoid. External.  PROCEDURE NOTE: Patient given informed consent for thrombectomy of her hemorrhoid. Signed consent is in the chart. Appropriate time out taken. We'll use a small amount of EMLA cream for initial anesthesia. We let that sit for about 10 minutes and then cleaned and prepped the area in usual sterile fashion using Betadine swabs. Local anesthesia was then augmented with 1 mL of 2% lidocaine without epinephrine that was injected into the wall of the hemorrhoid. A linear incision was then made the length of the swollen vein. There was no distinct clot noted but a lot of tension was previously noted on the vessel was relieved. I explored low-dose above and below with curved hemostats and could find no episode of clot. She had a fair amount of bleeding so we use some Monsel solution for local hemostasis which was obtained with some pressure. Patient tolerated procedure well. There were no complications. Estimated blood loss was less than 5 mL.       Assessment & Plan:  Thrombosed hemorrhoid. We did incision today. We'll put her on Proctofoam and sitz baths and follow her up in 24 hours. We have discussed red flags to watch for such as significant worsening of pain, unusual bleeding, fever.

## 2015-05-14 NOTE — Patient Instructions (Signed)
Sitz baths every 3-4 hours Proctofoam as rx See us tomorrow at 9;30

## 2015-05-14 NOTE — Telephone Encounter (Signed)
Pt called because she just had a miscarriage and now she is having issues with her Hemorrhoids. She is using OTC medication but nothing is helping. She would like to speak to a nurse about this issue. jw

## 2015-05-15 ENCOUNTER — Ambulatory Visit (HOSPITAL_COMMUNITY): Payer: Medicaid Other

## 2015-05-15 ENCOUNTER — Ambulatory Visit (INDEPENDENT_AMBULATORY_CARE_PROVIDER_SITE_OTHER): Payer: Medicaid Other | Admitting: Family Medicine

## 2015-05-15 VITALS — BP 139/90 | HR 72 | Temp 98.2°F | Wt 199.0 lb

## 2015-05-15 DIAGNOSIS — K645 Perianal venous thrombosis: Secondary | ICD-10-CM

## 2015-05-15 MED ORDER — DOCUSATE SODIUM 100 MG PO CAPS: 100.0000 mg | ORAL_CAPSULE | Freq: Two times a day (BID) | ORAL | Status: DC | PRN

## 2015-05-15 MED ORDER — HYDROCORTISONE ACE-PRAMOXINE 1-1 % RE FOAM: RECTAL | Status: DC

## 2015-05-15 MED ORDER — KETOROLAC TROMETHAMINE 60 MG/2ML IM SOLN
60.0000 mg | Freq: Once | INTRAMUSCULAR | Status: AC
  Administered 2015-05-15: 60 mg via INTRAMUSCULAR

## 2015-05-15 NOTE — Progress Notes (Signed)
Date of Visit: 05/15/2015   HPI:  Patient presents to follow up on hemorrhoids. Seen yesterday for acute thrombosed external hemorrhoid and underwent incision of hemorrhoid. No clot was found to be removed but patient did experience relief following the procedure. She has been using proctofoam and doing sitz baths since yesterday. Reports symptoms are doing much better. Has stopped bleeding from the incision (still has some vaginal bleeding from recent miscarriage). No fevers. Has not yet had BM since the procedure yesterday.  ROS: See HPI.  PMFSH: history of obesity, depression, hemorrhoids, allergic rhinitis, GERD  PHYSICAL EXAM: BP 139/90 mmHg  Pulse 72  Temp(Src) 98.2 F (36.8 C) (Oral)  Wt 199 lb (90.266 kg)  LMP 02/27/2015 (Within Weeks) Gen: NAD, pleasant, cooperative Psych: affect bright Rectum: external hemorrhoid visible, nontender to palpation, no bleeding seen. Incision from yesterday hemostatic.   ASSESSMENT/PLAN:  Hemorrhoid - doing well after incision. Despite no clot found to be removed, seems that the incision did successfully bring her symptomatic relief. Will rx colace to be used for bowel regimen to prevent worsening of hemorrhoids. Follow up as needed.  FOLLOW UP: F/u as needed if symptoms worsen or do not improve.   GrenadaBrittany J. Pollie MeyerMcIntyre, MD [facility]

## 2015-05-15 NOTE — Addendum Note (Signed)
Addended by: Lamonte SakaiZIMMERMAN RUMPLE, Mikhaela Zaugg D on: 05/15/2015 08:14 AM   Modules accepted: Orders

## 2015-05-15 NOTE — Patient Instructions (Signed)
Continue proctofoam Sent in colace (stool softener) - use if needed as this will help prevent hemorrhoids Return if increasing pain, fever, drainage, problems with stooling  Be well, Dr. Pollie MeyerMcIntyre

## 2015-05-16 ENCOUNTER — Other Ambulatory Visit: Payer: Medicaid Other

## 2015-05-16 DIAGNOSIS — O039 Complete or unspecified spontaneous abortion without complication: Secondary | ICD-10-CM

## 2015-05-16 LAB — HCG, QUANTITATIVE, PREGNANCY: HCG, BETA CHAIN, QUANT, S: 744.5 m[IU]/mL — AB

## 2015-05-26 ENCOUNTER — Encounter: Payer: Self-pay | Admitting: Family Medicine

## 2015-05-27 ENCOUNTER — Ambulatory Visit (HOSPITAL_COMMUNITY)
Admission: RE | Admit: 2015-05-27 | Payer: Medicaid Other | Source: Ambulatory Visit | Attending: Family Medicine | Admitting: Family Medicine

## 2015-05-27 ENCOUNTER — Other Ambulatory Visit: Payer: Self-pay | Admitting: Family Medicine

## 2015-07-04 ENCOUNTER — Telehealth: Payer: Self-pay | Admitting: Family Medicine

## 2015-07-04 NOTE — Telephone Encounter (Signed)
Pt. Called saying that she has pink eye and was asking for eye drops. She says she wants her PCP to call her in some eye drops. She says that she's had red, itchy, and watery eyes for the past two days. No pain, swelling, discharge, or vision changes. She says a co-worker at the nursing home told her she has pink eye. She has been using saline drops. She has not had fever, and her symptoms are unilateral. I stated that I could not prescribe anything over the phone, but that she should be ok to wait until Monday to come to a same day appointment if her symptoms remain. I told her that if she develops worse swelling, purulent discharge, pain in her eye, or vision changes, then she should go immediately to the urgent care of Emergency Room. Pt. Acknowledged this.   Devota Pace, MD Family Medicine - PGY 2

## 2015-07-05 ENCOUNTER — Encounter (HOSPITAL_COMMUNITY): Payer: Self-pay | Admitting: Emergency Medicine

## 2015-07-05 ENCOUNTER — Emergency Department (HOSPITAL_COMMUNITY)
Admission: EM | Admit: 2015-07-05 | Discharge: 2015-07-05 | Disposition: A | Discharge status: Home / Self Care | Payer: Medicaid Other | Attending: Emergency Medicine | Admitting: Emergency Medicine

## 2015-07-05 DIAGNOSIS — Z8679 Personal history of other diseases of the circulatory system: Secondary | ICD-10-CM | POA: Diagnosis not present

## 2015-07-05 DIAGNOSIS — Z79899 Other long term (current) drug therapy: Secondary | ICD-10-CM | POA: Diagnosis not present

## 2015-07-05 DIAGNOSIS — H5713 Ocular pain, bilateral: Secondary | ICD-10-CM | POA: Diagnosis present

## 2015-07-05 DIAGNOSIS — H109 Unspecified conjunctivitis: Secondary | ICD-10-CM | POA: Insufficient documentation

## 2015-07-05 MED ORDER — FLUORESCEIN SODIUM 1 MG OP STRP
1.0000 | ORAL_STRIP | Freq: Once | OPHTHALMIC | Status: AC
  Administered 2015-07-05: 1 via OPHTHALMIC
  Filled 2015-07-05: qty 1

## 2015-07-05 MED ORDER — TETRACAINE HCL 0.5 % OP SOLN
2.0000 [drp] | Freq: Once | OPHTHALMIC | Status: AC
  Administered 2015-07-05: 2 [drp] via OPHTHALMIC
  Filled 2015-07-05: qty 2

## 2015-07-05 MED ORDER — ERYTHROMYCIN 5 MG/GM OP OINT: TOPICAL_OINTMENT | OPHTHALMIC | Status: DC

## 2015-07-05 NOTE — Discharge Instructions (Signed)
Take your medications as prescribed. Continue washing her hands after touching her eye or the area around your eye to prevent it from spreading. Follow-up with your primary care provider in the next 3 days. Return to the emergency department if symptoms worsen or new onset of fever, visual changes, facial pain redness or swelling.   Emergency Department Resource Guide 1) Find a Doctor and Pay Out of Pocket Although you won't have to find out who is covered by your insurance plan, it is a good idea to ask around and get recommendations. You will then need to call the office and see if the doctor you have chosen will accept you as a new patient and what types of options they offer for patients who are self-pay. Some doctors offer discounts or will set up payment plans for their patients who do not have insurance, but you will need to ask so you aren't surprised when you get to your appointment.  2) Contact Your Local Health Department Not all health departments have doctors that can see patients for sick visits, but many do, so it is worth a call to see if yours does. If you don't know where your local health department is, you can check in your phone book. The CDC also has a tool to help you locate your state's health department, and many state websites also have listings of all of their local health departments.  3) Find a Walk-in Clinic If your illness is not likely to be very severe or complicated, you may want to try a walk in clinic. These are popping up all over the country in pharmacies, drugstores, and shopping centers. They're usually staffed by nurse practitioners or physician assistants that have been trained to treat common illnesses and complaints. They're usually fairly quick and inexpensive. However, if you have serious medical issues or chronic medical problems, these are probably not your best option.  No Primary Care Doctor: - Call Health Connect at  [phone] - they can help you  locate a primary care doctor that  accepts your insurance, provides certain services, etc. - Physician Referral Service- [phone]  Chronic Pain Problems: Organization         Address  Phone   Notes  [facility]  [phone] Patients need to be referred by their primary care doctor.   Medication Assistance: Organization         Address  Phone   Notes  [facility] [address] [place] [place], [place] [address] [phone] --Must be a resident of [facility] -- Must have NO insurance coverage whatsoever (no Medicaid/ Medicare, etc.) -- The pt. MUST have a primary care doctor that directs their care regularly and follows them in the community   [facility]ist  [phone]   [employer]  [phone]    Agencies that provide inexpensive medical care: Organization         Address  Phone   Notes  [name]  [phone]   [name]    [phone]   [facility] [address] [place], [place] [address] [phone]   [facility] [address] [place]. [address], [place] [phone]   Planned Parenthood    [phone]   [facility]    [phone]   [facility]  [address], [place] Phone:  [phone], Fax:  [phone]  Hours of Operation:  9 am - 6 pm, M-F.  Also accepts Medicaid/Medicare and self-pay.  [facility] for [place], [address], [place] Phone: [phone], Fax: [phone]. Hours of Operation:  8:30 am - 5:30 pm, M-F.  Also accepts Medicaid and self-pay.  [facility] [place] [address] [place] Phone: [phone]   [place], [place], [place] [phone], Ext. [phone] Mondays & Thursdays: 7-9 AM.  First 15 patients are seen on a first come, first serve basis.    [place]  Providers:  Organization         Address  Phone   Notes  [facility] [address], [place] [phone] Also accepts self-pay patients.  [facility] [address], [place]  [phone]   [place], [address], [place] [phone]   [facility] [address] [place] [phone]   Lucianne Lei [address], [place]   [phone] Only accepts [place] Access [place] patients after they have their name applied to their card.   Self-Pay (no insurance) in [facility]:  Organization         Address  Phone   Notes  Sickle Cell Patients, [facility] [place] [phone]   [facility]gent Care [place] [phone]   [facility] [place]  [place], [place], [place] [phone]   [facility]/Dr. Osei-Bonsu  [address] [place] or [address], [place] [phone] Phone number for both [place] and [place] locations is the same.  [facility] [address] [place] [phone]   [facility] [address] [place] or [address] [phone]   [facility] [address], [place] [phone], phone; [phone], fax Sees patients 1st and 3rd Saturday of every month.  Must not qualify for public or private insurance (i.e. Medicaid, Medicare, [place] Health Choice, Veterans' Benefits)  Household income should be no more than 200% of the poverty level The clinic cannot treat you if you are pregnant or think you are pregnant  Sexually transmitted diseases are not treated at the clinic.    Dental Care: Organization         Address  Phone  Notes  [facility] [place] [phone] Accepts children up to age 74 who are enrolled in [place] or [place]; pregnant women with a Medicaid card; and children who have applied for Medicaid or [place] Health Choice, but were declined, whose parents can pay a reduced fee at time of service.  [facility]  [address], [place] [phone] Accepts children up to age 32 who are enrolled in [place] or [place]; pregnant women with a Medicaid card; and children who have applied for Medicaid or [place] Health Choice, but were declined, whose parents can pay a reduced fee at time of service.  [facility]  [place] [phone] Patients are seen by appointment only. Walk-ins are not accepted. [place] will  see patients 45 years of age and older. Monday - Tuesday (8am-5pm) Most Wednesdays (8:30-5pm) $30 per visit, cash only  [facility] Adult Dental Access PROGRAM  [address], [facility] [phone] Patients are seen by appointment only. Walk-ins are not accepted. [place] will see patients 100 years of age and older. One Wednesday Evening (Monthly: Volunteer Based).  $30 per visit, cash only  [place]  [phone] for adults; Children under age 39, call Graduate Pediatric Dentistry at [phone]. Children aged 31-14, please call [phone] to request a pediatric application.  Dental services are provided in all areas of dental care including fillings, crowns and bridges, complete and partial dentures, implants, gum treatment, root canals, and extractions. Preventive care is also provided. Treatment is provided to both adults and children. Patients are selected via a lottery and there is often a waiting list.   [facility] [address] [place]  [phone] [email or web address]   [facility]  [address] [place], [place] [phone], Ext. [phone] Second and Fourth Thursday of each month, opens at 6:30 AM; Clinic ends at 9 AM.  Patients are seen on a first-come first-served basis, and a limited number are seen during each clinic.   [facility]  [address] Hillard Danker [place], [place] [phone]   Eligibility Requirements You must have lived in [place], [place], or [place] counties for at least the last three months.   You cannot be eligible for state or federal sponsored [employer], including [employer], [place], or [employer].   You generally cannot be eligible for healthcare insurance through your employer.    How to apply: Eligibility screenings are held every Tuesday and Wednesday afternoon from 1:00 pm until 4:00 pm. You do not need an appointment for the interview!  [facility] [address] [place], [place]   [place]  [place] Department  [place]  [phone]    Behavioral Health Resources in the Community: Intensive Outpatient Programs Organization         Address  Phone  Notes  [place]. [address] [place], [place] [phone]   [facility] [address], [place], [place]   [facility]: [facility] [address], [place], [place]   [place] [address]  [place], [place] or [phone]   Substance Abuse Resources Organization         Address  Phone  Notes  Alcohol and Drug Services  [phone]   [place]  [phone]   [place]   Chinita Pester  [phone]   Residential & Outpatient Substance Abuse Program  [phone]   Psychological Services Organization         Address  Phone  Notes  [facility] [place]  [place]  [phone]   [place] [address] [place] or [phone]    Mobile Crisis Teams Organization         Address  Phone  Notes  [facility], Mobile Crisis Care Unit  [phone]   [facility]  [address] [place], [place]   [facility] [address] [place] [phone]    Self-Help/Support Groups Organization  Address  Phone             Notes  [facility] [place] - variety of support groups  [phone]- [phone] Call for more information  Narcotics Anonymous (NA), Caring [facility] [address], [employer] [place]  2 meetings at this location   Statistician         Address  Phone  Notes  [facility] [address],    [place]  [phone]   [facility]  [address], [place] [address], [place], [place] [phone]   [facility] [facility] [address] [place], [place] [phone] Admissions: 8am-3pm M-F  [facility] [address]    [place], [place] [phone]   [facility] [address] [place], [place], [place] [phone]   [facility] [address],  [place], [place] [phone]   [facility] - Intensive Outpatient [address]ure[address], [place], [place] [phone]   [facility] ([facility]iction [facility] [address] [place].,  [place], [place] [phone] or [phone]   [facility] ([facility]) [address], [place], [place] [phone] Accepts Medicaid  [facility] [place] [address],  [place] [phone] Substance Abuse/Addiction Treatment   [facility] Organization         Address  Phone  Notes  [facility]  [phone]   Angie Fava, PhD [address] Ervin Knack [place], [place]   [phone] or [phone]    [facility] Behavioral   [address] [place], [place] [phone]   [facility] [address] [place], [place] [phone] Insurance/Medicaid/sponsorship through [facility] and Families [address]                                    [place], [place] [phone] Therapy/tele-psych/case  [facility] [address], [place] [phone]    Dr. Lolly Mustache  [phone]   [facility]  [facility] Dept. 1) [address] [place] [address] [place] 2) [address], [place] 3)  [address], [place] [phone]  [phone]   [facility] Child Abuse Hotline 504 888 8667 or [phone] (After Hours)

## 2015-07-05 NOTE — ED Notes (Signed)
Patient with bilateral eye pain, states she had to wipe out crusty drainage from one eye and her other eye does have some drainage also.

## 2015-07-05 NOTE — ED Notes (Signed)
Declined W/C at D/C and was escorted to lobby by RN. 

## 2015-07-05 NOTE — ED Provider Notes (Signed)
CSN: [id number]     Arrival date & time 07/05/15  2841 History   First MD Initiated Contact with Patient 07/05/15 [phone]     Chief Complaint  Patient presents with  . Conjunctivitis     (Consider location/radiation/quality/duration/timing/severity/associated sxs/prior Treatment) HPI   Patient is a 35 year old female past medical history of migraines who presents the ED with complaint of bilateral eye pain, onset 2 days. Patient reports having worsening pain to bilateral eyes which she describes as a scratching sensation. Endorses associated white/yellow drainage from bilateral eyes, eye redness. She notes the symptoms originally started in her right eye but are now present in bilateral eyes. Denies fever, chills, headache, visual changes, photophobia, facial redness/swelling. Denies use of contacts. Denies any recent injury/trauma to her eyes. She notes she has been using over-the-counter eyedrops without relief.  Past Medical History  Diagnosis Date  . SVD (spontaneous vaginal delivery)     x 2  . Migraines     otc med prn   Past Surgical History  Procedure Laterality Date  . Cholecystectomy      Gallstones removed-Around 35 years of age  . Dilation and curettage of uterus N/A 10/18/2014    Procedure: SUCTION DILATATION AND CURETTAGE;  Surgeon: Adam Phenix, MD;  Location: [facility];  Service: Gynecology;  Laterality: N/A;   Family History  Problem Relation Age of Onset  . Hypertension Mother   . Diabetes Mother   . Hypertension Maternal Grandmother   . Diabetes Maternal Grandmother   . Acute myelogenous leukemia Neg Hx   . Alcohol abuse Father   . Drug abuse Father   . Hypertension Sister   . Drug abuse Brother   . Alcohol abuse Brother    Social History  Substance Use Topics  . Smoking status: Never Smoker   . Smokeless tobacco: Never Used  . Alcohol Use: No   OB History    This patient's OB History needs to be verified. Open OB History to review and resolve any issues.    Gravida Para Term Preterm AB TAB SAB Ectopic Multiple Living   0 2     Review of Systems  Constitutional: Negative for fever.  HENT: Negative for congestion.   Eyes: Positive for pain, discharge and redness. Negative for photophobia, itching and visual disturbance.  Neurological: Negative for dizziness, light-headedness and headaches.      Allergies  Review of patient's allergies indicates no known allergies.  Home Medications   Prior to Admission medications   Medication Sig Start Date End Date Taking? Authorizing Provider  docusate sodium (COLACE) 100 MG capsule Take 1 capsule (100 mg total) by mouth 2 (two) times daily as needed for mild constipation. 05/15/15   Latrelle Dodrill, MD  erythromycin ophthalmic ointment Place a 1/2 inch ribbon of ointment into the lower eyelid. 07/05/15   Barrett Henle, PA-C  hydrocortisone-pramoxine [facility]) rectal foam Apply small amount liberally to hemorrhoid area 2-3 times a day 05/15/15   Latrelle Dodrill, MD  ibuprofen (ADVIL,MOTRIN) 600 MG tablet Take 1 tablet (600 mg total) by mouth every 6 (six) hours as needed. 05/10/15   Marny Lowenstein, PA-C  oxyCODONE-acetaminophen (PERCOCET/ROXICET) 5-325 MG tablet Take 1-2 tablets by mouth every 6 (six) hours as needed for severe pain. 05/10/15   Marny Lowenstein, PA-C  Prenatal Vit-Fe Fumarate-FA (PRENATAL VITAMINS) 28-0.8 MG TABS Take 1 tablet by mouth daily. Patient not taking: Reported on 05/11/2015 04/11/15  Alexander J Karamalegos, DO   BP 133/80 mmHg  Pulse 63  Temp(Src) 97.7 F (36.5 C) (Oral)  Resp 18  Ht  (1.549 m)  Wt 79.379 kg  BMI 33.08 kg/m2  SpO2 100%  LMP 06/02/2015 Physical Exam  Constitutional: She is oriented to person, place, and time. She appears well-developed and well-nourished.  HENT:  Head: Normocephalic and atraumatic.  Mouth/Throat: Oropharynx is clear and moist. No oropharyngeal exudate.  Eyes: EOM and lids are normal. Pupils  are equal, round, and reactive to light. Lids are everted and swept, no foreign bodies found. Right eye exhibits no discharge. Left eye exhibits no discharge. Right conjunctiva is injected. Left conjunctiva is injected. No scleral icterus.  Slit lamp exam:      The right eye shows no corneal abrasion, no corneal flare, no corneal ulcer, no foreign body and no fluorescein uptake.       The left eye shows no corneal abrasion, no corneal flare, no corneal ulcer, no foreign body and no fluorescein uptake.  Neck: Normal range of motion. Neck supple.  Cardiovascular: Normal rate.   Pulmonary/Chest: Effort normal.  Lymphadenopathy:    She has no cervical adenopathy.  Neurological: She is alert and oriented to person, place, and time.  Nursing note and vitals reviewed.   ED Course  Procedures (including critical care time) Labs Review Labs Reviewed - No data to display  Imaging Review No results found. I have personally reviewed and evaluated these images and lab results as part of my medical decision-making.  Filed Vitals:   07/05/15 0630 07/05/15 0733  BP: 130/92 133/80  Pulse: 64 63  Temp: 97.7 F (36.5 C)   Resp: 18 18     MDM   Final diagnoses:  Bilateral conjunctivitis    Pt presents with bilateral eye pain, drainage and redness. Denies contact use. VSS. Exam revealed injection of bilateral conjunctiva, no fluroescein uptake, no foreign bodies found. Presentation is consistent with bacterial conjunctivitis. Plan to discharge patient home with erythromycin eye ointment. Advised patient to follow up with her PCP.  Evaluation does not show pathology requring ongoing emergent intervention or admission. Pt is hemodynamically stable and mentating appropriately. Discussed findings/results and plan with patient/guardian, who agrees with plan. All questions answered. Return precautions discussed and outpatient follow up given.      Satira Sark [place], [place] 07/05/15 1610  Doug Sou, MD 07/05/15 [phone]

## 2015-07-31 ENCOUNTER — Telehealth: Payer: Self-pay | Admitting: Family Medicine

## 2015-07-31 ENCOUNTER — Ambulatory Visit: Payer: Self-pay | Admitting: Family Medicine

## 2015-07-31 NOTE — Telephone Encounter (Signed)
Spoke with patient, she states she has been having heavy vaginal bleeding since miscarrage in November. Recently started having bad cramping as well. SDA scheduled for today.

## 2015-07-31 NOTE — Telephone Encounter (Signed)
Is having some personal problems and needs to talk to a nurse about something

## 2015-09-14 ENCOUNTER — Encounter (HOSPITAL_COMMUNITY): Payer: Self-pay | Admitting: *Deleted

## 2015-09-14 ENCOUNTER — Emergency Department (HOSPITAL_COMMUNITY): Payer: Medicaid Other

## 2015-09-14 ENCOUNTER — Emergency Department (HOSPITAL_COMMUNITY)
Admission: EM | Admit: 2015-09-14 | Discharge: 2015-09-14 | Disposition: A | Discharge status: Home / Self Care | Payer: Medicaid Other | Attending: Emergency Medicine | Admitting: Emergency Medicine

## 2015-09-14 DIAGNOSIS — R05 Cough: Secondary | ICD-10-CM | POA: Diagnosis present

## 2015-09-14 DIAGNOSIS — R630 Anorexia: Secondary | ICD-10-CM | POA: Diagnosis not present

## 2015-09-14 DIAGNOSIS — R079 Chest pain, unspecified: Secondary | ICD-10-CM | POA: Diagnosis not present

## 2015-09-14 DIAGNOSIS — J019 Acute sinusitis, unspecified: Secondary | ICD-10-CM | POA: Diagnosis not present

## 2015-09-14 DIAGNOSIS — Z8679 Personal history of other diseases of the circulatory system: Secondary | ICD-10-CM | POA: Insufficient documentation

## 2015-09-14 MED ORDER — IBUPROFEN 400 MG PO TABS
ORAL_TABLET | ORAL | Status: AC
  Filled 2015-09-14: qty 1

## 2015-09-14 MED ORDER — IBUPROFEN 200 MG PO TABS
ORAL_TABLET | ORAL | Status: AC
  Filled 2015-09-14: qty 1

## 2015-09-14 MED ORDER — AMOXICILLIN-POT CLAVULANATE 875-125 MG PO TABS: 1.0000 | ORAL_TABLET | Freq: Two times a day (BID) | ORAL | Status: DC

## 2015-09-14 MED ORDER — IBUPROFEN 400 MG PO TABS
600.0000 mg | ORAL_TABLET | Freq: Once | ORAL | Status: AC
  Administered 2015-09-14: 600 mg via ORAL

## 2015-09-14 NOTE — ED Provider Notes (Signed)
CSN: [id number]     Arrival date & time 09/14/15  0551 History   First MD Initiated Contact with Patient 09/14/15 0725     Chief Complaint  Patient presents with  . Cough     (Consider location/radiation/quality/duration/timing/severity/associated sxs/prior Treatment) HPI   Erin Osborn is a 35 y.o F with hx of migraines who presents to the ED today c/o congestion, headache onset 11 days ago. Headache is in the front of her face over her sinuses. Pt also reports associated rhinorrhea, fatigue, dry cough and pain in her chest with coughing. Pt states she has tried taking tylenolo, OTC cough and congestion medication without relief. Last dose of Tylenol was at 4 am this morning. Pt has been drinking fluids, but has decreased appetite. Denies blurry vision, neck pain/stiffness, N/V/D, sore throat, otalgia, paresthesias.   Past Medical History  Diagnosis Date  . SVD (spontaneous vaginal delivery)     x 2  . Migraines     otc med prn   Past Surgical History  Procedure Laterality Date  . Cholecystectomy      Gallstones removed-Around 35 years of age  . Dilation and curettage of uterus N/A 10/18/2014    Procedure: SUCTION DILATATION AND CURETTAGE;  Surgeon: Adam Phenix, MD;  Location: [facility];  Service: Gynecology;  Laterality: N/A;   Family History  Problem Relation Age of Onset  . Hypertension Mother   . Diabetes Mother   . Hypertension Maternal Grandmother   . Diabetes Maternal Grandmother   . Acute myelogenous leukemia Neg Hx   . Alcohol abuse Father   . Drug abuse Father   . Hypertension Sister   . Drug abuse Brother   . Alcohol abuse Brother    Social History  Substance Use Topics  . Smoking status: Never Smoker   . Smokeless tobacco: Never Used  . Alcohol Use: No   OB History    This patient's OB History needs to be verified. Open OB History to review and resolve any issues.   Gravida Para Term Preterm AB TAB SAB Ectopic Multiple Living   0 2      Review of Systems  All other systems reviewed and are negative.     Allergies  Review of patient's allergies indicates no known allergies.  Home Medications   Prior to Admission medications   Medication Sig Start Date End Date Taking? Authorizing Provider  docusate sodium (COLACE) 100 MG capsule Take 1 capsule (100 mg total) by mouth 2 (two) times daily as needed for mild constipation. 05/15/15   Latrelle Dodrill, MD  erythromycin ophthalmic ointment Place a 1/2 inch ribbon of ointment into the lower eyelid. 07/05/15   Barrett Henle, PA-C  hydrocortisone-pramoxine [facility]) rectal foam Apply small amount liberally to hemorrhoid area 2-3 times a day 05/15/15   Latrelle Dodrill, MD  ibuprofen (ADVIL,MOTRIN) 600 MG tablet Take 1 tablet (600 mg total) by mouth every 6 (six) hours as needed. 05/10/15   Marny Lowenstein, PA-C  oxyCODONE-acetaminophen (PERCOCET/ROXICET) 5-325 MG tablet Take 1-2 tablets by mouth every 6 (six) hours as needed for severe pain. 05/10/15   Marny Lowenstein, PA-C  Prenatal Vit-Fe Fumarate-FA (PRENATAL VITAMINS) 28-0.8 MG TABS Take 1 tablet by mouth daily. Patient not taking: Reported on 05/11/2015 04/11/15   Netta Neat Karamalegos, DO   BP 138/102 mmHg  Pulse 74  Temp(Src) 98 F (36.7 C) (Oral)  Resp 18  SpO2 97%  LMP 09/01/2015 Physical Exam  Constitutional: She is oriented to person, place, and time. She appears well-developed and well-nourished. No distress.  HENT:  Head: Normocephalic and atraumatic.  Right Ear: Tympanic membrane normal.  Left Ear: Tympanic membrane normal.  Nose: Mucosal edema and rhinorrhea present. Right sinus exhibits maxillary sinus tenderness and frontal sinus tenderness. Left sinus exhibits maxillary sinus tenderness and frontal sinus tenderness.  Mouth/Throat: Uvula is midline, oropharynx is clear and moist and mucous membranes are normal. No trismus in the jaw. No uvula swelling. No oropharyngeal exudate,  posterior oropharyngeal edema, posterior oropharyngeal erythema or tonsillar abscesses.  Eyes: Conjunctivae are normal. Right eye exhibits no discharge. Left eye exhibits no discharge. No scleral icterus.  Neck: Normal range of motion. Neck supple.  Cardiovascular: Normal rate, regular rhythm, normal heart sounds and intact distal pulses.  Exam reveals no gallop and no friction rub.   No murmur heard. Pulmonary/Chest: Effort normal and breath sounds normal. No respiratory distress. She has no wheezes. She has no rales. She exhibits no tenderness.  Lymphadenopathy:    She has no cervical adenopathy.  Neurological: She is alert and oriented to person, place, and time. Coordination normal.  Skin: Skin is warm and dry. No rash noted. She is not diaphoretic. No erythema. No pallor.  Psychiatric: She has a normal mood and affect. Her behavior is normal.  Nursing note and vitals reviewed.   ED Course  Procedures (including critical care time) Labs Review Labs Reviewed - No data to display  Imaging Review No results found. I have personally reviewed and evaluated these images and lab results as part of my medical decision-making.   EKG Interpretation None      MDM   Final diagnoses:  Acute sinusitis, recurrence not specified, unspecified location    Severe symptoms have been present for greater than 10 days with purulent nasal discharge and maxillary sinus pain.  Concern for acute bacterial rhinosinusitis.  Patient discharged with Augmentin.  Instructions given for warm saline nasal wash and recommendations for follow-up with primary care physician.           Lester KinsmanSamantha Tripp Sheffield LakeDow[place], PA-C 09/14/15 [id number]  Glynn OctaveStephen Rancour, MD 09/14/15 [phone]

## 2015-09-14 NOTE — Discharge Instructions (Signed)

## 2015-09-14 NOTE — ED Notes (Signed)
PT is here with facial congestion and states chest hurts from coughing so much

## 2015-09-14 NOTE — ED Notes (Signed)
Pt called for triage x 1 no answer 

## 2015-11-22 ENCOUNTER — Telehealth: Payer: Self-pay | Admitting: Family Medicine

## 2015-11-22 NOTE — Telephone Encounter (Signed)
Is having vaginal discharge that is burning in nature. She doesn't know what can be causing it. She can try an OTC medication or she can follow up on Monday.   Myra RudeJeremy E Bobette Leyh, MD PGY-3, [facility] 11/22/2015, 9:50 PM

## 2015-11-26 ENCOUNTER — Other Ambulatory Visit (HOSPITAL_COMMUNITY)
Admission: RE | Admit: 2015-11-26 | Discharge: 2015-11-26 | Disposition: A | Discharge status: Home / Self Care | Payer: Medicaid Other | Source: Ambulatory Visit | Attending: Family Medicine | Admitting: Family Medicine

## 2015-11-26 ENCOUNTER — Ambulatory Visit (INDEPENDENT_AMBULATORY_CARE_PROVIDER_SITE_OTHER): Payer: Medicaid Other | Admitting: Internal Medicine

## 2015-11-26 ENCOUNTER — Encounter: Payer: Self-pay | Admitting: Internal Medicine

## 2015-11-26 VITALS — BP 138/82 | HR 68 | Temp 98.2°F | Ht 62.0 in | Wt 199.6 lb

## 2015-11-26 DIAGNOSIS — N898 Other specified noninflammatory disorders of vagina: Secondary | ICD-10-CM

## 2015-11-26 DIAGNOSIS — Z113 Encounter for screening for infections with a predominantly sexual mode of transmission: Secondary | ICD-10-CM | POA: Diagnosis present

## 2015-11-26 LAB — POCT WET PREP (WET MOUNT)
CLUE CELLS WET PREP WHIFF POC: NEGATIVE
WBC, Wet Prep HPF POC: >20

## 2015-11-26 MED ORDER — FLUCONAZOLE 150 MG PO TABS: 150.0000 mg | ORAL_TABLET | Freq: Once | ORAL | Status: DC

## 2015-11-26 NOTE — Assessment & Plan Note (Addendum)
1. Vaginal discharge, Patient with off white discharge, consistent with yeast infection. Tried over the counter vaginal cream which helped, however came back once she stopped the vaginal cream.  - Cervicovaginal ancillary only G/C  - POCT Wet Prep [facility]([place]),  - HIV test  - fluconazole (DIFLUCAN) 150 MG tablet; Take 1 tablet (150 mg total) by mouth once.  Dispense: 1 tablet; Refill: 0 - offered pregnancy test, patient not interested.  - Follow up as needed in a week  - LMP 11/02/2015, has not been sexually active for the past 2 months. Advised patient to follow up for pap smear and contraception management

## 2015-11-26 NOTE — Progress Notes (Signed)
   [facility] Erin CharsAsiyah Gaylen Venning, MD Phone: [phone]  Reason For Visit: Same day vaginal discharge   #  Vaginal itching, white curd like discharge, no odor, no pelvic pain, yeast infection started about 5 days ago. Vaginal cream for 3 days without much result. Not sexually active,  2 miscarriages last year. About 2 months ago was sexual active, not currently on any birth control.  Last cycle was 11/02/2015.  No other systemic symptoms, fever,chils, nausea/vomiting.    Past Medical History Reviewed problem list.  Medications- reviewed and updated No additions to family history Social history- patient is a non-smoker  Objective: There were no vitals taken for this visit. Gen: NAD, alert, cooperative with exam Genitalia:  Normal introitus for age, no external lesions, white vaginal discharge, mucosa pink and moist, no vaginal or cervical lesions, no vaginal atrophy, no friaility or hemorrhage, normal uterus size and position, no adnexal masses or tenderness,    Assessment/Plan: See problem based a/p Vaginal discharge 1. Vaginal discharge, Patient with off white discharge, consistent with yeast infection. Tried over the counter vaginal cream which helped, however came back once she stopped the vaginal cream.  - Cervicovaginal ancillary only G/C  - POCT Wet Prep [facility]([place]),  - HIV test  - fluconazole (DIFLUCAN) 150 MG tablet; Take 1 tablet (150 mg total) by mouth once.  Dispense: 1 tablet; Refill: 0 - offered pregnancy test, patient not interested.  - Follow up as needed in a week  - LMP 11/02/2015, has not been sexually active for the past 2 months. Advised patient to follow up for pap smear and contraception management

## 2015-11-26 NOTE — Patient Instructions (Signed)
Vaginal Discharge I will check on results of tests and provide medication as needed.  Call us if you have severe abdominal pain or sores or if you are not better in 1 week

## 2015-11-27 LAB — CERVICOVAGINAL ANCILLARY ONLY
CHLAMYDIA, DNA PROBE: NEGATIVE
Neisseria Gonorrhea: NEGATIVE

## 2015-12-01 ENCOUNTER — Telehealth: Payer: Self-pay | Admitting: Internal Medicine

## 2015-12-01 DIAGNOSIS — N898 Other specified noninflammatory disorders of vagina: Secondary | ICD-10-CM

## 2015-12-01 MED ORDER — FLUCONAZOLE 150 MG PO TABS: 150.0000 mg | ORAL_TABLET | Freq: Once | ORAL | Status: DC

## 2015-12-01 MED ORDER — MICONAZOLE NITRATE 200 & 2 MG-% (9GM) VA KIT
PACK | VAGINAL | Status: DC
Start: 2015-12-01 — End: 2016-09-24

## 2015-12-01 NOTE — Telephone Encounter (Addendum)
Discussed results of wet prep/GC and HIV with patient. Per results patient does not have any candida on wet prep, however patient had previously treated herself with anti-fungal cream, therefore results likely unreliable. Patient continuing to have itching and discharge, therefore prescribed a second dose of diflucan as well as vaginal cream per her request.

## 2015-12-12 ENCOUNTER — Ambulatory Visit (INDEPENDENT_AMBULATORY_CARE_PROVIDER_SITE_OTHER): Payer: Medicaid Other | Admitting: Family Medicine

## 2015-12-12 ENCOUNTER — Encounter: Payer: Self-pay | Admitting: Family Medicine

## 2015-12-12 VITALS — BP 135/95 | HR 89 | Temp 98.6°F | Wt 203.0 lb

## 2015-12-12 DIAGNOSIS — H1013 Acute atopic conjunctivitis, bilateral: Secondary | ICD-10-CM | POA: Diagnosis present

## 2015-12-12 DIAGNOSIS — H1010 Acute atopic conjunctivitis, unspecified eye: Secondary | ICD-10-CM | POA: Insufficient documentation

## 2015-12-12 MED ORDER — FLUTICASONE PROPIONATE 50 MCG/ACT NA SUSP: 2.0000 | Freq: Every day | NASAL | Status: DC

## 2015-12-12 MED ORDER — OLOPATADINE HCL 0.2 % OP SOLN: 1.0000 [drp] | Freq: Every day | OPHTHALMIC | Status: DC

## 2015-12-12 NOTE — Patient Instructions (Signed)
Start flonase/nasocort/nasonex every day. Can also add on zyrtec/allegra if you want, or can wait a few weeks  Start pataday drops, 1 drop per eye daily.

## 2015-12-12 NOTE — Assessment & Plan Note (Signed)
Story seems most consistent with allergic conjunctivitis. Will start with treatment with nasal steroids and pataday drops, add on zyrtec/allegra if not showing much improvement. Follow up in 1 week if no improvement. Return precautions given.

## 2015-12-12 NOTE — Progress Notes (Signed)
   Subjective:    Patient ID: Erin Osborn, female    DOB: 1981-03-29, 35 y.o.   MRN: [id number]  HPI  Patient presents for Same Day Appointment  CC: red eyes  # Red eyes:  Seen in ED in April for similar issue, diagnosed with pink eye and given ointment.  It will clear up for several days and get worse again. Currently has had issues for past 4 days.  Scratchy and itchy  Occasional blurriness but not constant  Very mildly painful  Wakes up with some matted eyes  Doesn't have bad allergies ROS: some runny nose, scratchy throat, no fevers, no trouble swallowing  Social Hx: never smoker  Review of Systems   See HPI for ROS.   Past medical history, surgical, family, and social history reviewed and updated in the EMR as appropriate.  Objective:  BP 135/95 mmHg  Pulse 89  Temp(Src) 98.6 F (37 C) (Oral)  Wt 203 lb (92.08 kg)  SpO2 98%  LMP 12/03/2015 Vitals and nursing note reviewed  General: no apparent distress  Eyes: PERRL, EOMI. There is mild conjunctival injection bilaterally without evidence of foreign body, no discharge. ENTM: normal oral mucosa and posterior pharynx. There is moderate swollen turbinates bilaterally with nasal discharge present.  Assessment & Plan:  Allergic conjunctivitis Story seems most consistent with allergic conjunctivitis. Will start with treatment with nasal steroids and pataday drops, add on zyrtec/allegra if not showing much improvement. Follow up in 1 week if no improvement. Return precautions given.   Return if symptoms worsen or fail to improve.

## 2016-03-25 ENCOUNTER — Encounter: Payer: Self-pay | Admitting: *Deleted

## 2016-03-25 ENCOUNTER — Ambulatory Visit (INDEPENDENT_AMBULATORY_CARE_PROVIDER_SITE_OTHER): Payer: Medicaid Other | Admitting: Family Medicine

## 2016-03-25 VITALS — BP 132/87 | HR 74 | Temp 98.5°F | Wt 204.2 lb

## 2016-03-25 DIAGNOSIS — H109 Unspecified conjunctivitis: Secondary | ICD-10-CM | POA: Insufficient documentation

## 2016-03-25 MED ORDER — POLYMYXIN B-TRIMETHOPRIM 10000-0.1 UNIT/ML-% OP SOLN: 1.0000 [drp] | Freq: Four times a day (QID) | OPHTHALMIC | Status: DC

## 2016-03-25 MED ORDER — POLYMYXIN B-TRIMETHOPRIM 10000-0.1 UNIT/ML-% OP SOLN: 1.0000 [drp] | Freq: Four times a day (QID) | OPHTHALMIC | 0 refills | Status: DC

## 2016-03-25 NOTE — Assessment & Plan Note (Signed)
Exam and history concerning for bacterial conjunctivitis as it is unilateral with some yellow drainage per her report. No evidence of orbital cellulitis. It is not consistent for acute angle glaucoma.  - Rx for polytrim 1drop q6hrs x 5 days. - discussed importance of hand hygiene.   - discussed return precautions.

## 2016-03-25 NOTE — Patient Instructions (Signed)
Your exam is consistent with a bacterial eye infection. I have prescribed an antibiotic drop. If you wear contacts, do not wear them until your symptoms have resolved.  Put 1 drop in the right eye every 6 hours. If the symptoms spread to the left eye, you can treat that eye as well. Wash your hands regularly and do not touch your face.  You should be okay to return to work on 03/26/16.   Bacterial Conjunctivitis Bacterial conjunctivitis, commonly called pink eye, is an inflammation of the clear membrane that covers the white part of the eye (conjunctiva). The inflammation can also happen on the underside of the eyelids. The blood vessels in the conjunctiva become inflamed, causing the eye to become red or pink. Bacterial conjunctivitis may spread easily from one eye to another and from person to person (contagious).  CAUSES  Bacterial conjunctivitis is caused by bacteria. The bacteria may come from your own skin, your upper respiratory tract, or from someone else with bacterial conjunctivitis. SYMPTOMS  The normally white color of the eye or the underside of the eyelid is usually pink or red. The pink eye is usually associated with irritation, tearing, and some sensitivity to light. Bacterial conjunctivitis is often associated with a thick, yellowish discharge from the eye. The discharge may turn into a crust on the eyelids overnight, which causes your eyelids to stick together. If a discharge is present, there may also be some blurred vision in the affected eye. DIAGNOSIS  Bacterial conjunctivitis is diagnosed by your caregiver through an eye exam and the symptoms that you report. Your caregiver looks for changes in the surface tissues of your eyes, which may point to the specific type of conjunctivitis. A sample of any discharge may be collected on a cotton-tip swab if you have a severe case of conjunctivitis, if your cornea is affected, or if you keep getting repeat infections that do not respond  to treatment. The sample will be sent to a lab to see if the inflammation is caused by a bacterial infection and to see if the infection will respond to antibiotic medicines. TREATMENT   Bacterial conjunctivitis is treated with antibiotics. Antibiotic eyedrops are most often used. However, antibiotic ointments are also available. Antibiotics pills are sometimes used. Artificial tears or eye washes may ease discomfort. HOME CARE INSTRUCTIONS   To ease discomfort, apply a cool, clean washcloth to your eye for 10-20 minutes, 3-4 times a day.  Gently wipe away any drainage from your eye with a warm, wet washcloth or a cotton ball.  Wash your hands often with soap and water. Use paper towels to dry your hands.  Do not share towels or washcloths. This may spread the infection.  Change or wash your pillowcase every day.  You should not use eye makeup until the infection is gone.  Do not operate machinery or drive if your vision is blurred.  Stop using contact lenses. Ask your caregiver how to sterilize or replace your contacts before using them again. This depends on the type of contact lenses that you use.  When applying medicine to the infected eye, do not touch the edge of your eyelid with the eyedrop bottle or ointment tube. SEEK IMMEDIATE MEDICAL CARE IF:   Your infection has not improved within 3 days after beginning treatment.  You had yellow discharge from your eye and it returns.  You have increased eye pain.  Your eye redness is spreading.  Your vision becomes blurred.  You have a  fever or persistent symptoms for more than 2-3 days.  You have a fever and your symptoms suddenly get worse.  You have facial pain, redness, or swelling. MAKE SURE YOU:   Understand these instructions.  Will watch your condition.  Will get help right away if you are not doing well or get worse.   This information is not intended to replace advice given to you by your health care provider.  Make sure you discuss any questions you have with your health care provider.   Document Released: 05/31/2005 Document Revised: 06/21/2014 Document Reviewed: 11/01/2011 Elsevier Interactive Patient Education [employer]2016 Elsevier Inc.

## 2016-03-25 NOTE — Progress Notes (Signed)
    Subjective: CC: pink eye  HPI: Patient is a 35 y.o. female with a past medical history of allergic rhinitis and chronic headaches presenting to clinic today for concerns for pink eyes.  Right pink eye:  3 days she's had conjunctival injection. She noted matting, crusting in the AM when she wakes up. Some yellow drainage.  Yesterday she noted pain in that eye.  The pain is throbbing intermittently. Sometimes light makes it worse. No change in vision. No headaches. No fevers or chills  The left eye is unaffected.  She thinks she gave it to her daughter as she now has it in 1 eye.  She's been using Visine red eye which intermittently helps with redness but is transient and doesn't help with other symptoms.   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  Past Medical History Patient Active Problem List   Diagnosis Date Noted  . Bacterial conjunctivitis 03/25/2016  . Allergic conjunctivitis 12/12/2015  . Vaginal discharge 11/26/2015  . Elevated glucose tolerance test 04/28/2015  . Supervision of normal pregnancy in first trimester 04/25/2015  . Missed abortion 10/18/2014  . Headache 10/25/2013  . Vesicular rash 10/30/2012  . Health maintenance examination 06/22/2012  . Pap smear, as part of routine gynecological examination 06/22/2012  . Sinusitis 12/22/2011  . Chronic headaches 10/08/2010  . ALLERGIC RHINITIS 10/31/2008  . OBESITY 08/02/2008  . GERD 06/21/2008  . DEPRESSIVE DISORDER, NOS 08/11/2006  . HEMORRHOIDS, NOS 08/11/2006    Medications- reviewed and updated  Objective: Office vital signs reviewed. BP 132/87 (BP Location: Left Arm, Patient Position: Sitting, Cuff Size: Normal)   Pulse 74   Temp 98.5 F (36.9 C) (Oral)   Wt 204 lb 3.2 oz (92.6 kg)   LMP 03/04/2016 (Exact  Date)   SpO2 98%   BMI 37.35 kg/m    Physical Examination:  General: Awake, alert, well- nourished, NAD ENMT:  TMs intact, normal light reflex, no erythema, no bulging. Nasal turbinates moist. MMM, Oropharynx clear without erythema or tonsillar exudate/hypertrophy.  Eyes: R conjunctiva injected. PERRL. No pain with EOM. Normal fundoscopic exam.  Cardio: RRR, no m/r/g noted.  Pulm: No increased WOB.  CTAB, without wheezes, rhonchi or crackles noted.   Assessment/Plan: Bacterial conjunctivitis Exam and history concerning for bacterial conjunctivitis as it is unilateral with some yellow drainage per her report. No evidence of orbital cellulitis. It is not consistent for acute angle glaucoma.  - Rx for polytrim 1drop q6hrs x 5 days. - discussed importance of hand hygiene.   - discussed return precautions.    No orders of the defined types were placed in this encounter.   Meds ordered this encounter  Medications  . DISCONTD: trimethoprim-polymyxin b (POLYTRIM) ophthalmic solution 1 drop  . trimethoprim-polymyxin b (POLYTRIM) ophthalmic solution    Sig: Place 1 drop into the right eye every 6 (six) hours.    Dispense:  10 mL    Refill:  0    Joanna Puffrystal S. Dorsey PGY-3, [facility]

## 2016-05-13 IMAGING — US US OB TRANSVAGINAL
1 series · 14 of 28 positions shown · non-contrast
Comparison: 10/03/2014 and earlier

CLINICAL DATA: 34-year-old female with spotting in the first
trimester of pregnancy. Findings on previous ultrasound suspicious
but not yet definitive for failed pregnancy. Subsequent encounter.

EXAM:
TRANSVAGINAL OB ULTRASOUND
TECHNIQUE: Transvaginal ultrasound was performed for complete evaluation of the
gestation as well as the maternal uterus, adnexal regions, and
pelvic cul-de-sac.

[Series 1: us ob comp less 14 wk · 49 acquisitions, 14 frames shown]
[im 2/49]
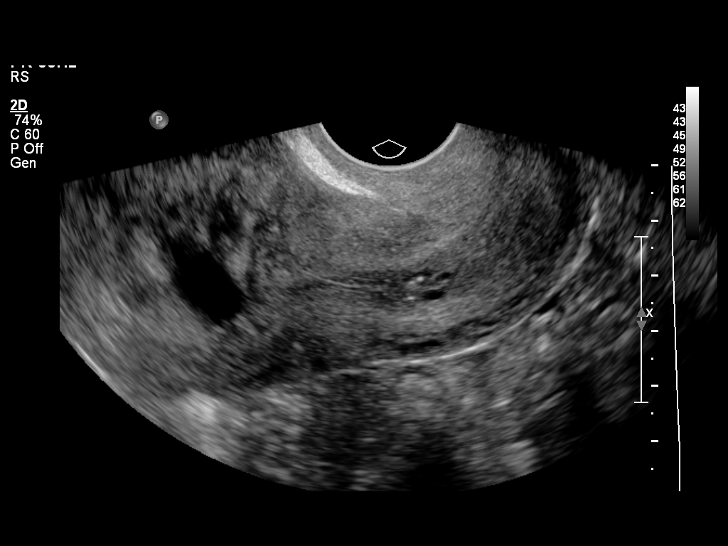
[im 6/49]
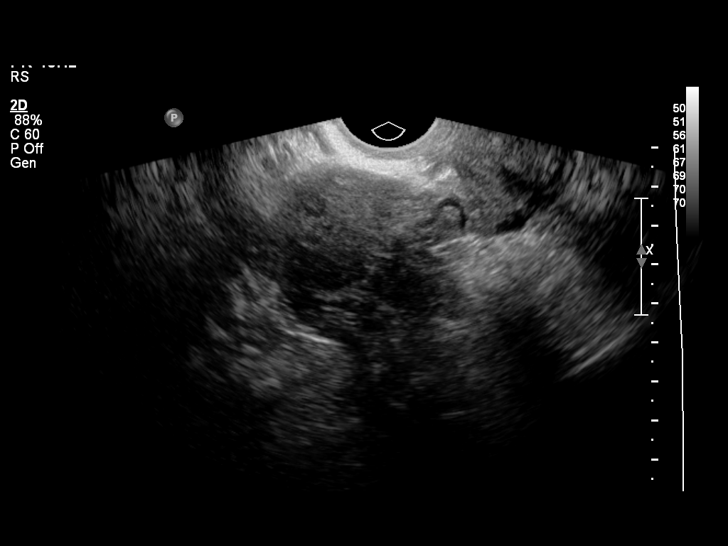
[im 9/49]
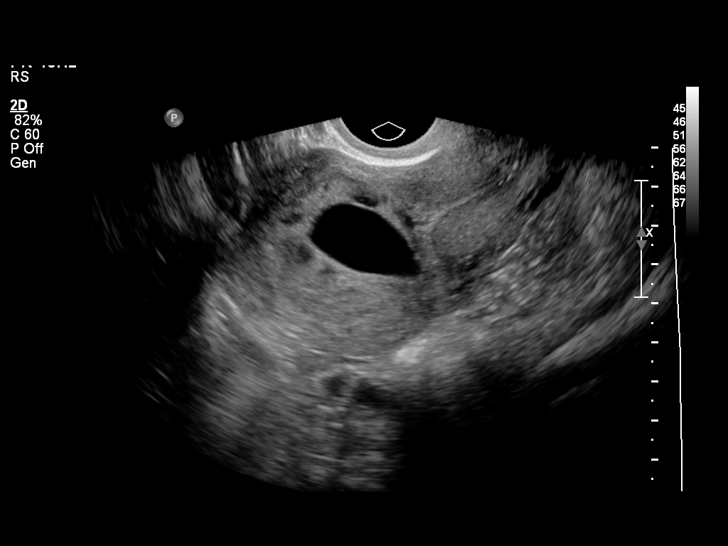
[im 13/49]
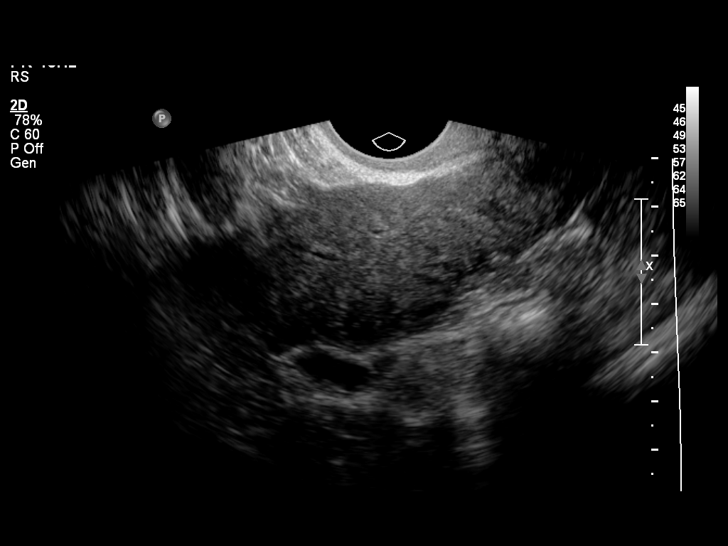
[im 17/49]
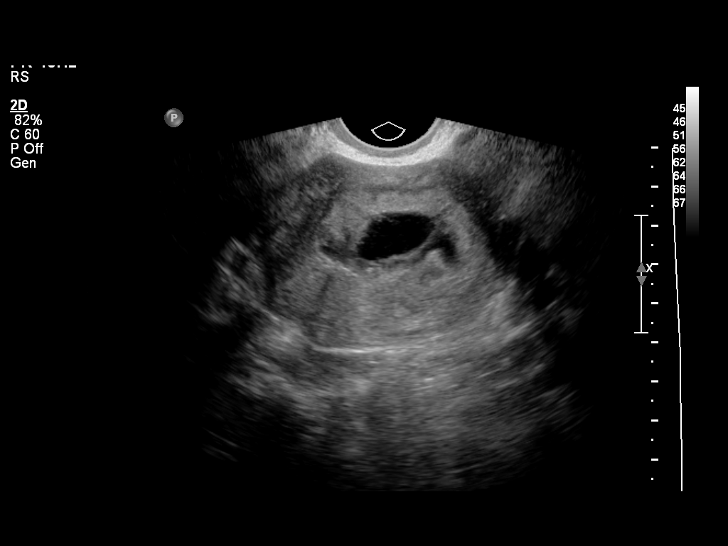
[im 20/49]
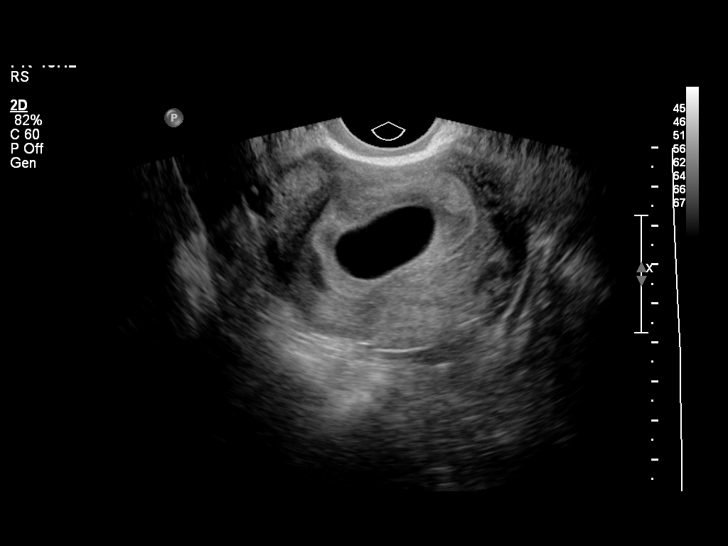
[im 24/49]
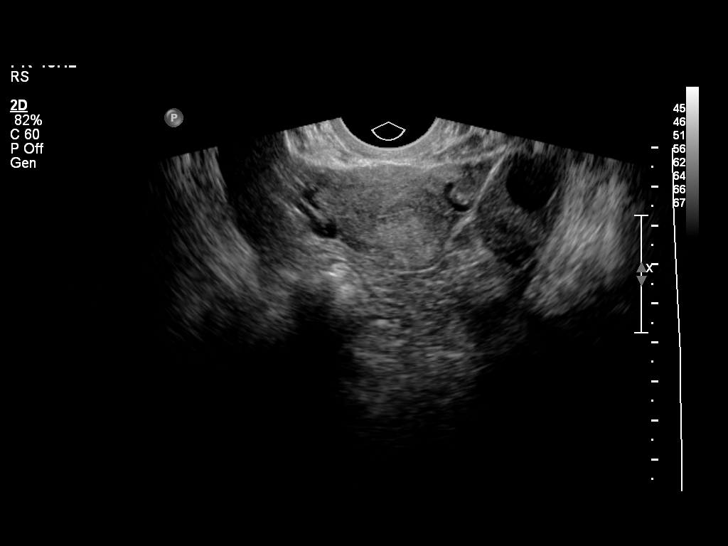
[im 27/49]
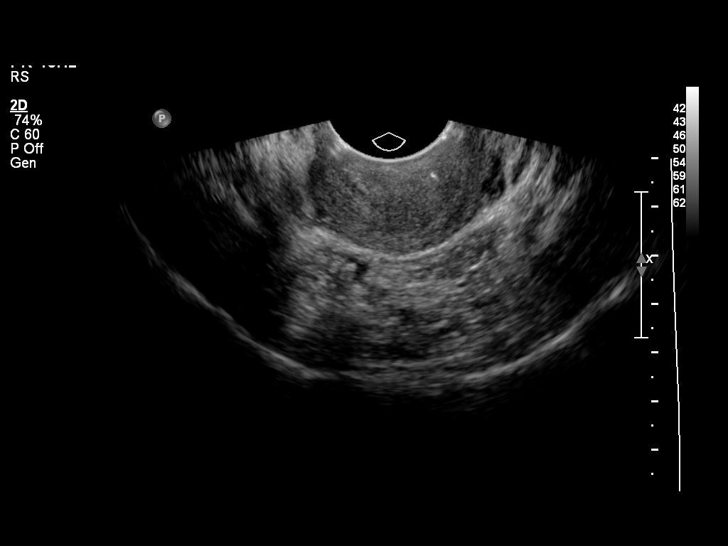
[im 31/49]
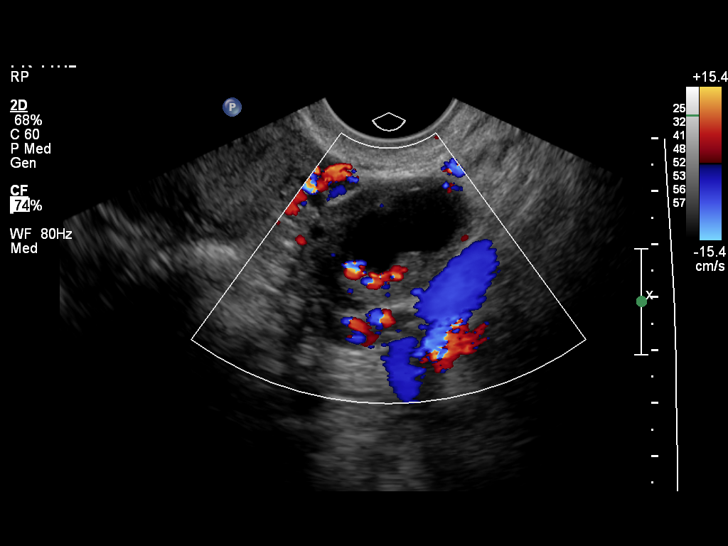
[im 34/49]
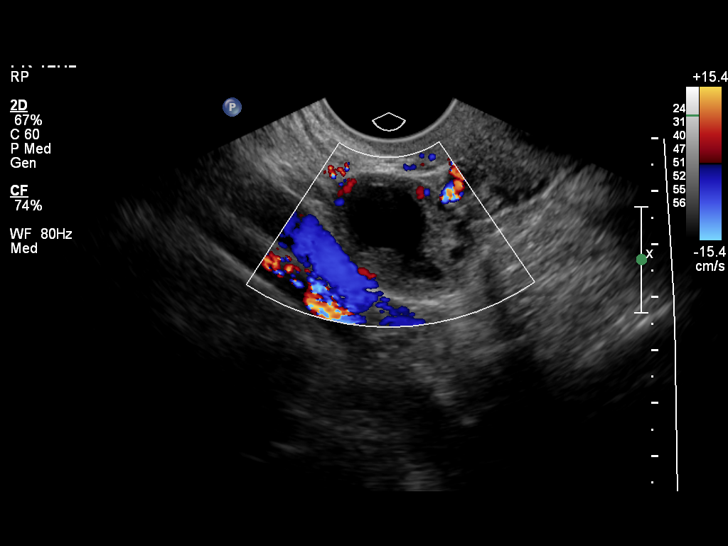
[im 38/49]
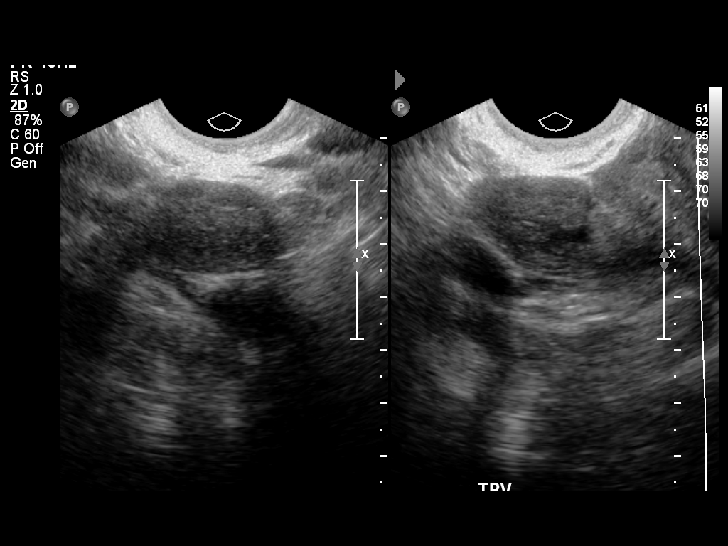
[im 41/49]
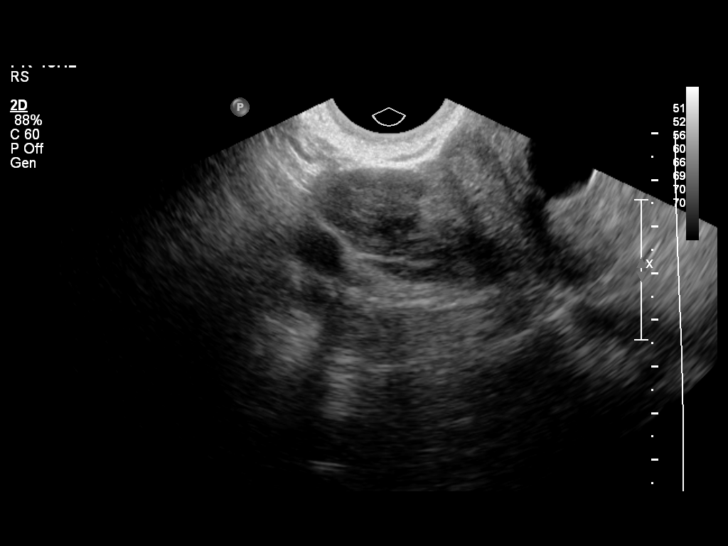
[im 45/49]
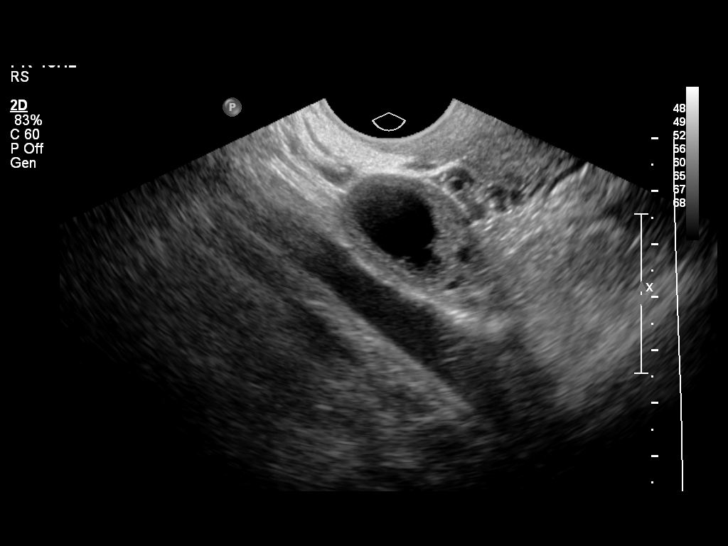
[im 49/49]
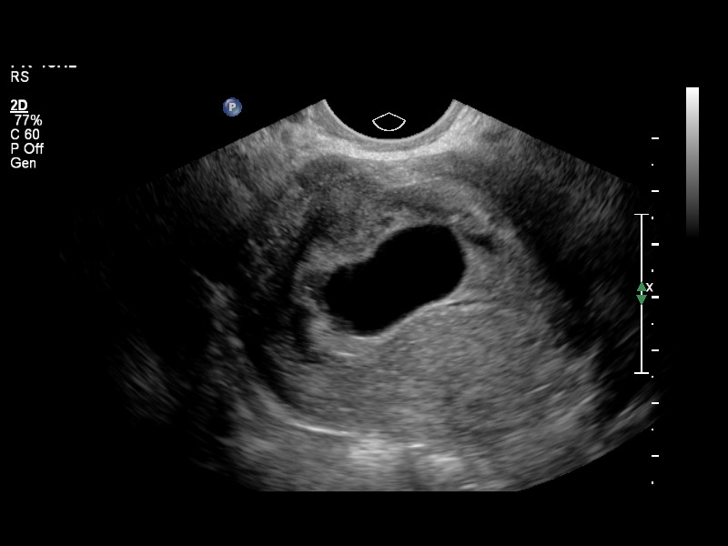

[14 of 28 positions shown; findings below may reference images not displayed]

FINDINGS: Intrauterine gestational sac: Single

Yolk sac:  Not visible

Embryo:  Not visible

Cardiac Activity: Not detected

Heart Rate: Not apple ago ball bpm

MSD: 23.9  mm   7 w   4  d

Maternal uterus/adnexae: Small to moderate volume of subchorionic
hemorrhage (images 10 and 11). Stable left ovary with
figure-of-eight shape probable corpus luteum. The right ovary
remains normal. No pelvic free fluid.
IMPRESSION: Continued absence of embryo greater than 2 weeks after a scan that
showed a gestational sac without ayolk sac (scan on 09/26/2014),
meets definitive criteria for failed pregnancy.

This follows SRU consensus guidelines: Diagnostic Criteria for
Nonviable Pregnancy Early in the First Trimester. N Engl J Med

## 2016-06-13 ENCOUNTER — Ambulatory Visit (HOSPITAL_COMMUNITY)
Admission: EM | Admit: 2016-06-13 | Discharge: 2016-06-13 | Disposition: A | Discharge status: Home / Self Care | Payer: Medicaid Other | Attending: Emergency Medicine | Admitting: Emergency Medicine

## 2016-06-13 ENCOUNTER — Encounter (HOSPITAL_COMMUNITY): Payer: Self-pay | Admitting: *Deleted

## 2016-06-13 DIAGNOSIS — L0100 Impetigo, unspecified: Secondary | ICD-10-CM | POA: Diagnosis not present

## 2016-06-13 MED ORDER — MUPIROCIN CALCIUM 2 % EX CREA: 1.0000 "application " | TOPICAL_CREAM | Freq: Two times a day (BID) | CUTANEOUS | 0 refills | Status: DC

## 2016-06-13 MED ORDER — CEPHALEXIN 500 MG PO CAPS: 500.0000 mg | ORAL_CAPSULE | Freq: Four times a day (QID) | ORAL | 0 refills | Status: DC

## 2016-06-13 NOTE — ED Triage Notes (Signed)
Lower  Lip  Swelling   X   Several  Days  Pt   Reports  Never  Had  These  Symptoms  Before   Appears  In no  Severs e  Distress

## 2016-06-13 NOTE — Discharge Instructions (Signed)
Take the antibiotic as directed. Apply the cream twice a day. Keep the area clean, wash her hands frequently. If you are not getting better or this lesion is getting larger and developing redness and more pus did not seek medical attention promptly.

## 2016-06-13 NOTE — ED Provider Notes (Signed)
CSN: [id number]     Arrival date & time 06/13/16  1756 History   First MD Initiated Contact with Patient 06/13/16 1902     No chief complaint on file.  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 She states it feels like a burning pain and sore to touch and feels as though that it goes deep. Denies other areas of the mouth that are affected. She states it occasionally drains a clear-like fluid. Denies fevers or other systemic symptoms.      Past Medical History:  Diagnosis Date  . Migraines    otc med prn  . SVD (spontaneous vaginal delivery)    x 2   Past Surgical History:  Procedure Laterality Date  . CHOLECYSTECTOMY     Gallstones removed-Around 35 years of age  . DILATION AND CURETTAGE OF UTERUS N/A 10/18/2014   Procedure: SUCTION DILATATION AND CURETTAGE;  Surgeon: Woodroe Mode, MD;  Location: [facility];  Service: Gynecology;  Laterality: N/A;   Family History  Problem Relation Age of Onset  . Hypertension Mother   . Diabetes Mother   . Hypertension Maternal Grandmother   . Diabetes Maternal Grandmother   . Acute myelogenous leukemia Neg Hx   . Alcohol abuse Father   . Drug abuse Father   . Hypertension Sister   . Drug abuse Brother   . Alcohol abuse Brother    Social History  Substance Use Topics  . Smoking status: Never Smoker  . Smokeless tobacco: Never Used  . Alcohol use No   OB History    This patient's OB History needs to be verified. Open OB History to review and resolve any issues.   Gravida Para Term Preterm AB Living   _0 SAB TAB Ectopic Multiple Live Births   2     0 2     Review of Systems  Constitutional: Negative.   HENT: Negative for congestion and postnasal drip.   Respiratory: Negative.   Neurological: Negative.   All other systems reviewed and  are negative.   Allergies  Patient has no known allergies.  Home Medications   Prior to Admission medications   Medication Sig Start Date End Date Taking? Authorizing Provider  cephALEXin (KEFLEX) 500 MG capsule Take 1 capsule (500 mg total) by mouth 4 (four) times daily. 06/13/16   Janne Napoleon, NP  docusate sodium (COLACE) 100 MG capsule Take 1 capsule (100 mg total) by mouth 2 (two) times daily as needed for mild constipation. 05/15/15   Leeanne Rio, MD  fluticasone (FLONASE) 50 MCG/ACT nasal spray Place 2 sprays into both nostrils daily. 12/12/15   Leone Brand, MD  hydrocortisone-pramoxine [facility]) rectal foam Apply small amount liberally to hemorrhoid area 2-3 times a day 05/15/15   Leeanne Rio, MD  ibuprofen (ADVIL,MOTRIN) 600 MG tablet Take 1 tablet (600 mg total) by mouth every 6 (six) hours as needed. 05/10/15   Luvenia Redden, PA-C  miconazole nitrate ([facility] MICONAZOLE 3-DAY COMBO) 200 & 2 MG-% (9GM) KIT Insert 1 suppository at bedtime for 3 days 12/01/15   Asiyah Cletis Media, MD  mupirocin cream (BACTROBAN) 2 % Apply 1 application topically 2 (two) times daily. 06/13/16   Janne Napoleon, NP  Olopatadine HCl 0.2 % SOLN Apply 1 drop to eye daily. Each eye 12/12/15  Leone Brand, MD  trimethoprim-polymyxin b [facility]) ophthalmic solution Place 1 drop into the right eye every 6 (six) hours. 03/25/16   Archie Patten, MD   Meds Ordered and Administered this Visit  Medications - No data to display  BP 131/89 (BP Location: Right Arm)   Pulse 67   Temp 98.2 F (36.8 C) (Oral)   Resp 16   SpO2 100%  No data found.   Physical Exam  Constitutional: She is oriented to person, place, and time. She appears well-developed and well-nourished. No distress.  HENT:  Mouth/Throat: Oropharynx is clear and moist.  There is a crusty regularly shaped raised rough lesion approximately 6 mm across to the left aspect of the lower lip involving the vermilion. There is  surrounding swelling and a firmness within the soft tissue of the lip which is also tender. Currently there is no drainage. No exudate or purulence. No surrounding erythema or cellulitis.  Neck: Neck supple.  Cardiovascular: Normal rate.   Pulmonary/Chest: Effort normal.  Neurological: She is alert and oriented to person, place, and time.  Skin: Skin is warm and dry.  Nursing note and vitals reviewed.   Urgent Care Course   Clinical Course     Procedures (including critical care time)  Labs Review Labs Reviewed - No data to display  Imaging Review No results found.   Visual Acuity Review  Right Eye Distance:   Left Eye Distance:   Bilateral Distance:    Right Eye Near:   Left Eye Near:    Bilateral Near:         MDM   1. Impetigo any site   The differential would be viral versus bacterial. It appears to be more bacterial at this time. More tenderness surrounding the area and the crusty honey-colored appearance looks similar to impetigo or localized staph infection. We will treat as below. Take the antibiotic as directed. Apply the cream twice a day. Keep the area clean, wash her hands frequently. If you are not getting better or this lesion is getting larger and developing redness and more pus did not seek medical attention promptly. Meds ordered this encounter  Medications  . mupirocin cream (BACTROBAN) 2 %    Sig: Apply 1 application topically 2 (two) times daily.    Dispense:  15 g    Refill:  0    Order Specific Question:   Supervising Provider    Answer:   Melony Overly [id number]  . cephALEXin (KEFLEX) 500 MG capsule    Sig: Take 1 capsule (500 mg total) by mouth 4 (four) times daily.    Dispense:  28 capsule    Refill:  0    Order Specific Question:   Supervising Provider    Answer:   Melony Overly [1961]       Janne Napoleon, NP 06/13/16 1916

## 2016-07-27 ENCOUNTER — Ambulatory Visit: Payer: Self-pay | Admitting: Family Medicine

## 2016-07-27 ENCOUNTER — Ambulatory Visit (INDEPENDENT_AMBULATORY_CARE_PROVIDER_SITE_OTHER): Payer: Medicaid Other | Admitting: Student

## 2016-07-27 ENCOUNTER — Encounter: Payer: Self-pay | Admitting: Student

## 2016-07-27 VITALS — BP 136/80 | HR 80 | Temp 98.4°F | Wt 204.0 lb

## 2016-07-27 DIAGNOSIS — K649 Unspecified hemorrhoids: Secondary | ICD-10-CM

## 2016-07-27 MED ORDER — HYDROCORTISONE ACE-PRAMOXINE 1-1 % RE FOAM: RECTAL | 1 refills | Status: DC

## 2016-07-27 NOTE — Progress Notes (Signed)
   Subjective:    Patient ID: Erin Osborn, female    DOB: 1980-09-27, 36 y.o.   MRN: [id number]   CC: hemorrhoids  HPI: 36 y/o F presents for hemorrhoid pain  Hemorrhoid pain - reports a history of similar pain from her hemorrhoids - at that time she has an incision to release suspected thrombosed hemorrhoids - she was given proctofoam at that time which she felt was most helpful  - she has used this only once for this episode as well as preparation H - she denies sensation of prolapse - no fevers or pain inside of her rectum -   Smoking status reviewed  Review of Systems  Per HPI, else denies recent illness, fever, chest pain, shortness of breath, abdominal pain, N/V/D    Objective:  BP 136/80   Pulse 80   Temp 98.4 F (36.9 C) (Oral)   Wt 204 lb (92.5 kg)   LMP 07/15/2016   SpO2 99%   BMI 37.31 kg/m  Vitals and nursing note reviewed  General: NAD Cardiac: RRR Respiratory: CTAB, normal effort Extremities: no edema or cyanosis. WWP. Skin: warm and dry, no rashes noted Rectal: swollen external hemorrhoids, no evidence of thrombosis, mildly erythematous, tender to palpation Neuro: alert and oriented, no focal deficits   Assessment & Plan:    Hemorrhoids External hemorrhoids, did not appear thrombosed in clinic - will treat with proctofoam as this has been helpful in past and preparation H - will follow up by the end of the week if not improving    Assia Meanor A. Kennon RoundsHaney MD, MS Family Medicine Resident PGY-3 Pager [phone]

## 2016-07-27 NOTE — Patient Instructions (Signed)
Follow up by the end of the week if pain has not improved If you have questions or concerns, call the office at [phone]

## 2016-07-27 NOTE — Assessment & Plan Note (Signed)
External hemorrhoids, did not appear thrombosed in clinic - will treat with proctofoam as this has been helpful in past and preparation H - will follow up by the end of the week if not improving

## 2016-07-29 ENCOUNTER — Emergency Department (HOSPITAL_COMMUNITY)
Admission: EM | Admit: 2016-07-29 | Discharge: 2016-07-29 | Disposition: A | Discharge status: Home / Self Care | Payer: Medicaid Other | Attending: Emergency Medicine | Admitting: Emergency Medicine

## 2016-07-29 ENCOUNTER — Encounter (HOSPITAL_COMMUNITY): Payer: Self-pay

## 2016-07-29 ENCOUNTER — Encounter: Payer: Self-pay | Admitting: Physician Assistant

## 2016-07-29 DIAGNOSIS — Z79899 Other long term (current) drug therapy: Secondary | ICD-10-CM | POA: Diagnosis not present

## 2016-07-29 DIAGNOSIS — K644 Residual hemorrhoidal skin tags: Secondary | ICD-10-CM

## 2016-07-29 DIAGNOSIS — K6289 Other specified diseases of anus and rectum: Secondary | ICD-10-CM | POA: Diagnosis present

## 2016-07-29 MED ORDER — DIBUCAINE 1 % RE OINT: 1.0000 "application " | TOPICAL_OINTMENT | RECTAL | 0 refills | Status: DC | PRN

## 2016-07-29 MED ORDER — PSYLLIUM 28 % PO PACK: 1.0000 | PACK | Freq: Two times a day (BID) | ORAL | 0 refills | Status: DC

## 2016-07-29 NOTE — ED Triage Notes (Signed)
Patient here with ongoing rectal pain related to hemorrhoids. Seen at [facility] 2 days ago and states her medicaid want pay for her medicine. NAD

## 2016-07-29 NOTE — ED Provider Notes (Signed)
[facility] DEPT Provider Note   CSN: [id number] Arrival date & time: 07/29/16  1497     History   Chief Complaint Chief Complaint  Patient presents with  . hemorrhoid medicine    HPI Erin Osborn is a 36 y.o. female.  HPI   Patient is a 36 year old female with history of hemorrhoids who presents the ED with complaint of worsening hemorrhoids, onset 2 weeks. Patient reports over the past few weeks she has had worsening rectal pain related to her hemorrhoids. She notes she was seen by her PCP at [facility] earlier this week and was given a prescription of proctofoam. Patient reports she tried to get the prescription filled but states the pharmacist advised her that it was not covered by her medicated he was unable to afford the medication. She notes she is continue using preparation H and performing sitz baths without improvement of symptoms. Denies fever, abdominal pain, vomiting, diarrhea, constipation, urinary symptoms, rectal bleeding.   Past Medical History:  Diagnosis Date  . Migraines    otc med prn  . SVD (spontaneous vaginal delivery)    x 2    Patient Active Problem List   Diagnosis Date Noted  . Bacterial conjunctivitis 03/25/2016  . Allergic conjunctivitis 12/12/2015  . Vaginal discharge 11/26/2015  . Elevated glucose tolerance test 04/28/2015  . Supervision of normal pregnancy in first trimester 04/25/2015  . Missed abortion 10/18/2014  . Headache 10/25/2013  . Vesicular rash 10/30/2012  . Health maintenance examination 06/22/2012  . Pap smear, as part of routine gynecological examination 06/22/2012  . Sinusitis 12/22/2011  . Chronic headaches 10/08/2010  . ALLERGIC RHINITIS 10/31/2008  . OBESITY 08/02/2008  . GERD 06/21/2008  . DEPRESSIVE DISORDER, NOS 08/11/2006  . Hemorrhoids 08/11/2006    Past Surgical History:  Procedure Laterality Date  . CHOLECYSTECTOMY     Gallstones removed-Around 36 years of age  . DILATION AND CURETTAGE OF UTERUS N/A  10/18/2014   Procedure: SUCTION DILATATION AND CURETTAGE;  Surgeon: Woodroe Mode, MD;  Location: [facility];  Service: Gynecology;  Laterality: N/A;    OB History    This patient's OB History needs to be verified. Open OB History to review and resolve any issues.   Gravida Para Term Preterm AB Living   '4 2 2   2 2   ' SAB TAB Ectopic Multiple Live Births   2     0 2       Home Medications    Prior to Admission medications   Medication Sig Start Date End Date Taking? Authorizing Provider  cephALEXin (KEFLEX) 500 MG capsule Take 1 capsule (500 mg total) by mouth 4 (four) times daily. 06/13/16   Janne Napoleon, NP  dibucaine (NUPERCAINAL) 1 % OINT Place 1 application rectally as needed for pain. 07/29/16   Nona Dell, PA-C  docusate sodium (COLACE) 100 MG capsule Take 1 capsule (100 mg total) by mouth 2 (two) times daily as needed for mild constipation. 05/15/15   Leeanne Rio, MD  fluticasone (FLONASE) 50 MCG/ACT nasal spray Place 2 sprays into both nostrils daily. 12/12/15   Leone Brand, MD  hydrocortisone-pramoxine [facility]) rectal foam Apply small amount liberally to hemorrhoid area 2-3 times a day 07/27/16   Veatrice Bourbon, MD  ibuprofen (ADVIL,MOTRIN) 600 MG tablet Take 1 tablet (600 mg total) by mouth every 6 (six) hours as needed. 05/10/15   Luvenia Redden, PA-C  miconazole nitrate ([facility] MICONAZOLE 3-DAY COMBO) 200 & 2 MG-% (9GM)  KIT Insert 1 suppository at bedtime for 3 days 12/01/15   Asiyah Cletis Media, MD  mupirocin cream (BACTROBAN) 2 % Apply 1 application topically 2 (two) times daily. 06/13/16   Janne Napoleon, NP  Olopatadine HCl 0.2 % SOLN Apply 1 drop to eye daily. Each eye 12/12/15   Leone Brand, MD  psyllium (METAMUCIL SMOOTH TEXTURE) 28 % packet Take 1 packet by mouth 2 (two) times daily. 07/29/16   Nona Dell, PA-C  trimethoprim-polymyxin b (POLYTRIM) ophthalmic solution Place 1 drop into the right eye every 6 (six) hours. 03/25/16   Archie Patten, MD    Family History Family History  Problem Relation Age of Onset  . Hypertension Mother   . Diabetes Mother   . Hypertension Maternal Grandmother   . Diabetes Maternal Grandmother   . Alcohol abuse Father   . Drug abuse Father   . Hypertension Sister   . Drug abuse Brother   . Alcohol abuse Brother   . Acute myelogenous leukemia Neg Hx     Social History Social History  Substance Use Topics  . Smoking status: Never Smoker  . Smokeless tobacco: Never Used  . Alcohol use No     Allergies   Patient has no known allergies.   Review of Systems Review of Systems  Gastrointestinal: Positive for rectal pain.  All other systems reviewed and are negative.    Physical Exam Updated Vital Signs BP 125/82 (BP Location: Left Arm)   Pulse 86   Temp 97.6 F (36.4 C) (Oral)   Resp 18   Ht '5\' 5"'  (1.651 m)   Wt 94.3 kg   LMP 07/15/2016   SpO2 99%   BMI 34.61 kg/m   Physical Exam  Constitutional: She is oriented to person, place, and time. She appears well-developed and well-nourished. No distress.  HENT:  Head: Normocephalic and atraumatic.  Eyes: Conjunctivae and EOM are normal. Right eye exhibits no discharge. Left eye exhibits no discharge. No scleral icterus.  Neck: Normal range of motion. Neck supple.  Cardiovascular: Normal rate, regular rhythm, normal heart sounds and intact distal pulses.   Pulmonary/Chest: Effort normal and breath sounds normal. No respiratory distress. She has no wheezes. She has no rales. She exhibits no tenderness.  Abdominal: Soft. Bowel sounds are normal. She exhibits no distension and no mass. There is no tenderness. There is no rebound and no guarding. No hernia.  Genitourinary: Rectal exam shows external hemorrhoid. Rectal exam shows no internal hemorrhoid, no fissure, no mass, no tenderness and anal tone normal.  Genitourinary Comments: 3 nonthrombosed external hemorrhoids present with mild TTP, no bleeding.   Musculoskeletal:  She exhibits no edema.  Neurological: She is alert and oriented to person, place, and time.  Skin: Skin is warm and dry. She is not diaphoretic.  Nursing note and vitals reviewed.    ED Treatments / Results  Labs (all labs ordered are listed, but only abnormal results are displayed) Labs Reviewed - No data to display  EKG  EKG Interpretation None       Radiology No results found.  Procedures Procedures (including critical care time)  Medications Ordered in ED Medications - No data to display   Initial Impression / Assessment and Plan / ED Course  I have reviewed the triage vital signs and the nursing notes.  Pertinent labs & imaging results that were available during my care of the patient were reviewed by me and considered in my medical decision making (see chart  for details).     Patient presents with rectal pain associated with her hemorrhoids. VSS. Exam revealed a 3 nonthrombosed external hemorrhoids with mild tenderness to palpation, no rectal bleeding. Remaining exam unremarkable. Plan to discharge patient home with prescription of dibucaine ointment, Metamucil and advised patient to continue taking sitz baths at home. Advised patient to follow up with PCP for follow-up evaluation. Discussed return precautions.  Final Clinical Impressions(s) / ED Diagnoses   Final diagnoses:  External hemorrhoid    New Prescriptions New Prescriptions   DIBUCAINE (NUPERCAINAL) 1 % OINT    Place 1 application rectally as needed for pain.   PSYLLIUM (METAMUCIL SMOOTH TEXTURE) 28 % PACKET    Take 1 packet by mouth 2 (two) times daily.     Chesley Noon [place], [place] 07/29/16 [place], MD 07/29/16 [phone]

## 2016-07-29 NOTE — ED Notes (Signed)
C/ rectal pain states none of medications she is using is helping.

## 2016-07-29 NOTE — Discharge Instructions (Signed)
Use your prescribed ointment as prescribed to help with her hemorrhoids. I also recommend taking the prescription of Metamucil as prescribed to help keep your bowels soft and prevent constipation or worsening of hemorrhoids. Continue performing sitz baths at home. I recommend following up with your primary care provider in the next 2-3 days for follow-up evaluation. If your symptoms have not improved I recommend following up with a gastroenterologist for further management of your hemorrhoids. Please return to the Emergency Department if symptoms worsen or new onset of fever, abdominal pain, vomiting, constipation, rectal bleeding.

## 2016-08-02 ENCOUNTER — Ambulatory Visit: Payer: Self-pay | Admitting: Physician Assistant

## 2016-09-15 ENCOUNTER — Encounter: Payer: Self-pay | Admitting: Internal Medicine

## 2016-09-15 ENCOUNTER — Ambulatory Visit (INDEPENDENT_AMBULATORY_CARE_PROVIDER_SITE_OTHER): Payer: Medicaid Other | Admitting: Internal Medicine

## 2016-09-15 VITALS — BP 110/76 | HR 92 | Temp 98.4°F | Wt 209.0 lb

## 2016-09-15 DIAGNOSIS — N912 Amenorrhea, unspecified: Secondary | ICD-10-CM | POA: Diagnosis present

## 2016-09-15 LAB — POCT URINE PREGNANCY: PREG TEST UR: POSITIVE — AB

## 2016-09-15 MED ORDER — PRENATAL VITAMINS 0.8 MG PO TABS: 1.0000 | ORAL_TABLET | Freq: Every day | ORAL | 12 refills | Status: DC

## 2016-09-15 NOTE — Patient Instructions (Signed)
Please make an appointment for your initial prenatal visit in about 3 weeks. Congratulations again.

## 2016-09-15 NOTE — Progress Notes (Signed)
   [name] Noralee Chars, MD Phone: [phone]  Reason For Visit: SDA for positive pregnancy   - Last LMP was August 10, 2016 - not currently on any birth control, regular pregnancy testing  - Breast soreness, no pain, no bleeding, no nausea or vomiting  - Not currently taking prenatal vitamins   - Desired pregnancy - patient previously had two miscarriages  - Does not smoke, drink EtOH or do any other drugs   Past Medical History Reviewed problem list.  Medications- reviewed and updated No additions to family history Social history- patient is a non- smoker  Objective: BP 110/76   Pulse 92   Temp 98.4 F (36.9 C) (Oral)   Wt 209 lb (94.8 kg)   LMP 08/10/2016   SpO2 99%   BMI 34.78 kg/m  Gen: NAD, alert, cooperative with exam MSK: Normal gait and station Skin: dry, intact, no rashes or lesions  Assessment/Plan: See problem based a/p  Amenorrhea Positive pregnancy test - per LMP currently [redacted]w[redacted]d  - POCT urine pregnancy - OB panel  - Prenatal vitamins  - follow up in about 3 weeks

## 2016-09-15 NOTE — Assessment & Plan Note (Signed)
Positive pregnancy test - per LMP currently [redacted]w[redacted]d  - POCT urine pregnancy - OB panel  - Prenatal vitamins  - follow up in about 3 weeks

## 2016-09-16 ENCOUNTER — Telehealth: Payer: Self-pay | Admitting: Internal Medicine

## 2016-09-16 DIAGNOSIS — N912 Amenorrhea, unspecified: Secondary | ICD-10-CM

## 2016-09-16 LAB — HIV ANTIBODY (ROUTINE TESTING W REFLEX): HIV Screen 4th Generation wRfx: NONREACTIVE

## 2016-09-16 NOTE — Addendum Note (Signed)
Addended by: Jennette Bill on: 09/16/2016 09:39 AM   Modules accepted: Orders

## 2016-09-16 NOTE — Telephone Encounter (Signed)
Please call patient and let her she needs to provide a urine sample for her prenatal labs. Please have her make a lab appointment for this. Thanks Erin Osborn

## 2016-09-17 LAB — PRENATAL PROFILE I(LABCORP)
Antibody Screen: NEGATIVE
Basophils Absolute: 0 10*3/uL (ref 0.0–0.2)
Basos: 0 %
EOS (ABSOLUTE): 0.3 10*3/uL (ref 0.0–0.4)
Eos: 2 %
HEMOGLOBIN: 11.7 g/dL (ref 11.1–15.9)
HEP B S AG: NEGATIVE
Hematocrit: 35.9 % (ref 34.0–46.6)
IMMATURE GRANS (ABS): 0 10*3/uL (ref 0.0–0.1)
IMMATURE GRANULOCYTES: 0 %
LYMPHS: 22 %
Lymphocytes Absolute: 2.6 10*3/uL (ref 0.7–3.1)
MCH: 28.5 pg (ref 26.6–33.0)
MCHC: 32.6 g/dL (ref 31.5–35.7)
MCV: 88 fL (ref 79–97)
MONOCYTES: 6 %
MONOS ABS: 0.7 10*3/uL (ref 0.1–0.9)
NEUTROS PCT: 70 %
Neutrophils Absolute: 8.5 10*3/uL — ABNORMAL HIGH (ref 1.4–7.0)
Platelets: 296 10*3/uL (ref 150–379)
RBC: 4.1 x10E6/uL (ref 3.77–5.28)
RDW: 15.2 % (ref 12.3–15.4)
RH TYPE: POSITIVE
RPR Ser Ql: NONREACTIVE
Rubella Antibodies, IGG: 1.16 index (ref >0.99)
WBC: 12.2 10*3/uL — ABNORMAL HIGH (ref 3.4–10.8)

## 2016-09-17 NOTE — Telephone Encounter (Signed)
Patient informed, will schedule lab appointment. 

## 2016-09-24 ENCOUNTER — Inpatient Hospital Stay (HOSPITAL_COMMUNITY)
Admission: AD | Admit: 2016-09-24 | Discharge: 2016-09-24 | Disposition: A | Discharge status: Home / Self Care | Payer: Medicaid Other | Source: Ambulatory Visit | Attending: Obstetrics and Gynecology | Admitting: Obstetrics and Gynecology

## 2016-09-24 ENCOUNTER — Inpatient Hospital Stay (HOSPITAL_COMMUNITY): Payer: Medicaid Other

## 2016-09-24 ENCOUNTER — Encounter (HOSPITAL_COMMUNITY): Payer: Self-pay | Admitting: *Deleted

## 2016-09-24 DIAGNOSIS — R109 Unspecified abdominal pain: Secondary | ICD-10-CM | POA: Diagnosis not present

## 2016-09-24 DIAGNOSIS — Z3A01 Less than 8 weeks gestation of pregnancy: Secondary | ICD-10-CM | POA: Diagnosis not present

## 2016-09-24 DIAGNOSIS — Z3491 Encounter for supervision of normal pregnancy, unspecified, first trimester: Secondary | ICD-10-CM

## 2016-09-24 DIAGNOSIS — N83202 Unspecified ovarian cyst, left side: Secondary | ICD-10-CM | POA: Diagnosis not present

## 2016-09-24 DIAGNOSIS — O26899 Other specified pregnancy related conditions, unspecified trimester: Secondary | ICD-10-CM | POA: Diagnosis not present

## 2016-09-24 DIAGNOSIS — O26891 Other specified pregnancy related conditions, first trimester: Secondary | ICD-10-CM | POA: Diagnosis not present

## 2016-09-24 DIAGNOSIS — O3481 Maternal care for other abnormalities of pelvic organs, first trimester: Secondary | ICD-10-CM | POA: Insufficient documentation

## 2016-09-24 LAB — URINALYSIS, ROUTINE W REFLEX MICROSCOPIC
Bilirubin Urine: NEGATIVE
GLUCOSE, UA: NEGATIVE mg/dL
Hgb urine dipstick: NEGATIVE
Ketones, ur: NEGATIVE mg/dL
LEUKOCYTES UA: NEGATIVE
NITRITE: NEGATIVE
PROTEIN: NEGATIVE mg/dL
Specific Gravity, Urine: 1.018 (ref 1.005–1.030)
pH: 5 (ref 5.0–8.0)

## 2016-09-24 LAB — HCG, QUANTITATIVE, PREGNANCY: hCG, Beta Chain, Quant, S: 36466 m[IU]/mL — ABNORMAL HIGH (ref <5)

## 2016-09-24 LAB — WET PREP, GENITAL
CLUE CELLS WET PREP: NONE SEEN
Sperm: NONE SEEN
Trich, Wet Prep: NONE SEEN
Yeast Wet Prep HPF POC: NONE SEEN

## 2016-09-24 LAB — CBC
HCT: 34.7 % — ABNORMAL LOW (ref 36.0–46.0)
Hemoglobin: 11.8 g/dL — ABNORMAL LOW (ref 12.0–15.0)
MCH: 29.3 pg (ref 26.0–34.0)
MCHC: 34 g/dL (ref 30.0–36.0)
MCV: 86.1 fL (ref 78.0–100.0)
PLATELETS: 278 10*3/uL (ref 150–400)
RBC: 4.03 MIL/uL (ref 3.87–5.11)
RDW: 15.1 % (ref 11.5–15.5)
WBC: 13.3 10*3/uL — ABNORMAL HIGH (ref 4.0–10.5)

## 2016-09-24 NOTE — Progress Notes (Signed)
Discharge instructions given, questions answered, signed and given copy

## 2016-09-24 NOTE — MAU Note (Signed)
Having a lot of pain in stomach, allover, crampy like a stomach virus, started a couple days ago, getting worse.  preg confirmed at [facility], has appt with [facility]

## 2016-09-24 NOTE — Discharge Instructions (Signed)
Abdominal Pain During Pregnancy Abdominal pain is common in pregnancy. Most of the time, it does not cause harm. There are many causes of abdominal pain. Some causes are more serious than others and sometimes the cause is not known. Abdominal pain can be a sign that something is very wrong with the pregnancy or the pain may have nothing to do with the pregnancy. Always tell your health care provider if you have any abdominal pain. Follow these instructions at home:  Do not have sex or put anything in your vagina until your symptoms go away completely.  Watch your abdominal pain for any changes.  Get plenty of rest until your pain improves.  Drink enough fluid to keep your urine clear or pale yellow.  Take over-the-counter or prescription medicines only as told by your health care provider.  Keep all follow-up visits as told by your health care provider. This is important. Contact a health care provider if:  You have a fever.  Your pain gets worse or you have cramping.  Your pain continues after resting. Get help right away if:  You are bleeding, leaking fluid, or passing tissue from the vagina.  You have vomiting or diarrhea that does not go away.  You have painful or bloody urination.  You notice a decrease in your baby's movements.  You feel very weak or faint.  You have shortness of breath.  You develop a severe headache with abdominal pain.  You have abnormal vaginal discharge with abdominal pain. This information is not intended to replace advice given to you by your health care provider. Make sure you discuss any questions you have with your health care provider. Document Released: 05/31/2005 Document Revised: 03/11/2016 Document Reviewed: 12/28/2012 Elsevier Interactive Patient Education  2017 [employer] Inc.  Ovarian Cyst  An ovarian cyst is a fluid-filled sac that forms on an ovary. The ovaries are small organs that produce eggs in women. Various types of cysts can  form on the ovaries. Some may cause symptoms and require treatment. Most ovarian cysts go away on their own, are not cancerous (are benign), and do not cause problems. Common types of ovarian cysts include:  Functional (follicle) cysts.  Occur during the menstrual cycle, and usually go away with the next menstrual cycle if you do not get pregnant.  Usually cause no symptoms.  Endometriomas.  Are cysts that form from the tissue that lines the uterus (endometrium).  Are sometimes called chocolate cysts because they become filled with blood that turns brown.  Can cause pain in the lower abdomen during intercourse and during your period.  Cystadenoma cysts.  Develop from cells on the outside surface of the ovary.  Can get very large and cause lower abdomen pain and pain with intercourse.  Can cause severe pain if they twist or break open (rupture).  Dermoid cysts.  Are sometimes found in both ovaries.  May contain different kinds of body tissue, such as skin, teeth, hair, or cartilage.  Usually do not cause symptoms unless they get very big.  Theca lutein cysts.  Occur when too much of a certain hormone (human chorionic gonadotropin) is produced and overstimulates the ovaries to produce an egg.  Are most common after having procedures used to assist with the conception of a baby (in vitro fertilization). What are the causes? Ovarian cysts may be caused by:  Ovarian hyperstimulation syndrome. This is a condition that can develop from taking fertility medicines. It causes multiple large ovarian cysts to form.  Polycystic ovarian syndrome (PCOS). This is a common hormonal disorder that can cause ovarian cysts, as well as problems with your period or fertility. What increases the risk? The following factors may make you more likely to develop ovarian cysts:  Being overweight or obese.  Taking fertility medicines.  Taking certain forms of hormonal birth  control.  Smoking. What are the signs or symptoms? Many ovarian cysts do not cause symptoms. If symptoms are present, they may include:  Pelvic pain or pressure.  Pain in the lower abdomen.  Pain during sex.  Abdominal swelling.  Abnormal menstrual periods.  Increasing pain with menstrual periods. How is this diagnosed? These cysts are commonly found during a routine pelvic exam. You may have tests to find out more about the cyst, such as:  Ultrasound.  X-ray of the pelvis.  CT scan.  MRI.  Blood tests. How is this treated? Many ovarian cysts go away on their own without treatment. Your health care provider may want to check your cyst regularly for 2-3 months to see if it changes. If you are in menopause, it is especially important to have your cyst monitored closely because menopausal women have a higher rate of ovarian cancer. When treatment is needed, it may include:  Medicines to help relieve pain.  A procedure to drain the cyst (aspiration).  Surgery to remove the whole cyst.  Hormone treatment or birth control pills. These methods are sometimes used to help dissolve a cyst. Follow these instructions at home:  Take over-the-counter and prescription medicines only as told by your health care provider.  Do not drive or use heavy machinery while taking prescription pain medicine.  Get regular pelvic exams and Pap tests as often as told by your health care provider.  Return to your normal activities as told by your health care provider. Ask your health care provider what activities are safe for you.  Do not use any products that contain nicotine or tobacco, such as cigarettes and e-cigarettes. If you need help quitting, ask your health care provider.  Keep all follow-up visits as told by your health care provider. This is important. Contact a health care provider if:  Your periods are late, irregular, or painful, or they stop.  You have pelvic pain that does  not go away.  You have pressure on your bladder or trouble emptying your bladder completely.  You have pain during sex.  You have any of the following in your abdomen:  A feeling of fullness.  Pressure.  Discomfort.  Pain that does not go away.  Swelling.  You feel generally ill.  You become constipated.  You lose your appetite.  You develop severe acne.  You start to have more body hair and facial hair.  You are gaining weight or losing weight without changing your exercise and eating habits.  You think you may be pregnant. Get help right away if:  You have abdominal pain that is severe or gets worse.  You cannot eat or drink without vomiting.  You suddenly develop a fever.  Your menstrual period is much heavier than usual. This information is not intended to replace advice given to you by your health care provider. Make sure you discuss any questions you have with your health care provider. Document Released: 05/31/2005 Document Revised: 12/19/2015 Document Reviewed: 11/02/2015 Elsevier Interactive Patient Education  2017 [employer].

## 2016-09-24 NOTE — MAU Provider Note (Signed)
History     CSN: [id number]  Arrival date and time: 09/24/16 1324   First Provider Initiated Contact with Patient 09/24/16 [phone]      Chief Complaint  Patient presents with  . Abdominal Cramping   HPI  Erin Osborn is a 36 y.o. [id number] at 58w5dby LMP who presents with abdominal cramping. Symptoms began 2 days ago. Reports intermittent cramping across lower abdomen. Rates pain 5/10. Has not treated. Nothing makes better or worse. Some nausea. Denies vomiting, diarrhea, constipation, vaginal bleeding, vaginal discharge, or dysuria. Last BM was yesterday. No recent intercourse. [facility]; has never been seen by them.   OB History    This patient's OB History needs to be verified. Open OB History to review and resolve any issues.   Gravida Para Term Preterm AB Living   '5 2 2   2 2   ' SAB TAB Ectopic Multiple Live Births   2     0 2      Past Medical History:  Diagnosis Date  . Migraines    otc med prn  . SVD (spontaneous vaginal delivery)    x 2    Past Surgical History:  Procedure Laterality Date  . CHOLECYSTECTOMY     Gallstones removed-Around 36years of age  . DILATION AND CURETTAGE OF UTERUS N/A 10/18/2014   Procedure: SUCTION DILATATION AND CURETTAGE;  Surgeon: JWoodroe Mode MD;  [facility];  Service: Gynecology;  Laterality: N/A;    Family History  Problem Relation Age of Onset  . Hypertension Mother   . Diabetes Mother   . Hypertension Maternal Grandmother   . Diabetes Maternal Grandmother   . Alcohol abuse Father   . Drug abuse Father   . Hypertension Sister   . Drug abuse Brother   . Alcohol abuse Brother   . Acute myelogenous leukemia Neg Hx     Social History  Substance Use Topics  . Smoking status: Never Smoker  . Smokeless tobacco: Never Used  . Alcohol use No    Allergies: No Known Allergies  Prescriptions Prior to Admission  Medication Sig Dispense Refill Last Dose  . Prenatal  Multivit-Min-Fe-FA (PRENATAL VITAMINS) 0.8 MG tablet Take 1 tablet by mouth daily. 30 tablet 12 09/24/2016 at Unknown time  . cephALEXin (KEFLEX) 500 MG capsule Take 1 capsule (500 mg total) by mouth 4 (four) times daily. 28 capsule 0   . dibucaine (NUPERCAINAL) 1 % OINT Place 1 application rectally as needed for pain. 28 g 0   . fluticasone (FLONASE) 50 MCG/ACT nasal spray Place 2 sprays into both nostrils daily. 16 g 6   . hydrocortisone-pramoxine (PROCTOFOAM-HC) rectal foam Apply small amount liberally to hemorrhoid area 2-3 times a day 10 g 1   . ibuprofen (ADVIL,MOTRIN) 600 MG tablet Take 1 tablet (600 mg total) by mouth every 6 (six) hours as needed. 30 tablet 0 05/11/2015 at Unknown time  . miconazole nitrate ([employer] MICONAZOLE 3-DAY COMBO) 200 & 2 MG-% (9GM) KIT Insert 1 suppository at bedtime for 3 days 1 each 0   . mupirocin cream (BACTROBAN) 2 % Apply 1 application topically 2 (two) times daily. 15 g 0   . Olopatadine HCl 0.2 % SOLN Apply 1 drop to eye daily. Each eye 2.5 mL 1   . psyllium (METAMUCIL SMOOTH TEXTURE) 28 % packet Take 1 packet by mouth 2 (two) times daily. 30 packet 0   . trimethoprim-polymyxin b (POLYTRIM) ophthalmic  solution Place 1 drop into the right eye every 6 (six) hours. 10 mL 0     Review of Systems  Constitutional: Negative.   Gastrointestinal: Positive for abdominal pain and nausea. Negative for constipation, diarrhea and vomiting.  Genitourinary: Negative.    Physical Exam   Blood pressure 127/78, pulse 74, temperature 98.2 F (36.8 C), temperature source Oral, resp. rate 16, height '5\' 2"'  (1.575 m), weight 209 lb (94.8 kg), last menstrual period 08/01/2016, SpO2 100 %.  Physical Exam  Nursing note and vitals reviewed. Constitutional: She is oriented to person, place, and time. She appears well-developed and well-nourished. No distress.  HENT:  Head: Normocephalic and atraumatic.  Eyes: Conjunctivae are normal. Right eye exhibits no discharge. Left eye  exhibits no discharge. No scleral icterus.  Neck: Normal range of motion.  Cardiovascular: Normal rate, regular rhythm and normal heart sounds.   No murmur heard. Respiratory: Effort normal and breath sounds normal. No respiratory distress. She has no wheezes.  GI: Soft. Bowel sounds are normal. She exhibits no distension. There is no tenderness. There is no rebound and no guarding.  Neurological: She is alert and oriented to person, place, and time.  Skin: Skin is warm and dry. She is not diaphoretic.  Psychiatric: She has a normal mood and affect. Her behavior is normal. Judgment and thought content normal.    MAU Course  Procedures Results for orders placed or performed during the hospital encounter of 09/24/16 (from the past 48 hour(s))  Urinalysis, Routine w reflex microscopic     Status: Abnormal   Collection Time: 09/24/16  8:22 AM  Result Value Ref Range   Color, Urine YELLOW YELLOW   APPearance HAZY (A) CLEAR   Specific Gravity, Urine 1.018 1.005 - 1.030   pH 5.0 5.0 - 8.0   Glucose, UA NEGATIVE NEGATIVE mg/dL   Hgb urine dipstick NEGATIVE NEGATIVE   Bilirubin Urine NEGATIVE NEGATIVE   Ketones, ur NEGATIVE NEGATIVE mg/dL   Protein, ur NEGATIVE NEGATIVE mg/dL   Nitrite NEGATIVE NEGATIVE   Leukocytes, UA NEGATIVE NEGATIVE  CBC     Status: Abnormal   Collection Time: 09/24/16  9:02 AM  Result Value Ref Range   WBC 13.3 (H) 4.0 - 10.5 K/uL   RBC 4.03 3.87 - 5.11 MIL/uL   Hemoglobin 11.8 (L) 12.0 - 15.0 g/dL   HCT 34.7 (L) 36.0 - 46.0 %   MCV 86.1 78.0 - 100.0 fL   MCH 29.3 26.0 - 34.0 pg   MCHC 34.0 30.0 - 36.0 g/dL   RDW 15.1 11.5 - 15.5 %   Platelets 278 150 - 400 K/uL  hCG, quantitative, pregnancy     Status: Abnormal   Collection Time: 09/24/16  9:02 AM  Result Value Ref Range   hCG, Beta Chain, Quant, S 36,466 (H) <5 mIU/mL    Comment:          GEST. AGE      CONC.  (mIU/mL)   <=1 WEEK        5 - 50     2 WEEKS       50 - 500     3 WEEKS       100 - 10,000      4 WEEKS     1,000 - 30,000     5 WEEKS     3,500 - 115,000   6-8 WEEKS     12,000 - 270,000    12 WEEKS     15,000 -  220,000        FEMALE AND NON-PREGNANT FEMALE:     LESS THAN 5 mIU/mL   Wet prep, genital     Status: Abnormal   Collection Time: 09/24/16  9:19 AM  Result Value Ref Range   Yeast Wet Prep HPF POC NONE SEEN NONE SEEN    Comment: Specimen diluted due to transport tube containing more than 1 ml of saline, interpret results with caution.   Trich, Wet Prep NONE SEEN NONE SEEN   Clue Cells Wet Prep HPF POC NONE SEEN NONE SEEN   WBC, Wet Prep HPF POC FEW (A) NONE SEEN    Comment: MANY BACTERIA SEEN   Sperm NONE SEEN    US Ob Comp Less 14 Wks  Result Date: 09/24/2016 CLINICAL DATA:  Abdominal pain affecting pregnancy. EXAM: OBSTETRIC <14 WK [place] AND TRANSVAGINAL OB US TECHNIQUE: Both transabdominal and transvaginal ultrasound examinations were performed for complete evaluation of the gestation as well as the maternal uterus, adnexal regions, and pelvic cul-de-sac. Transvaginal technique was performed to assess early pregnancy. COMPARISON:  None. FINDINGS: Intrauterine gestational sac: Single Yolk sac:  Visualized Embryo:  Visualized Cardiac Activity: Visualized Heart Rate: 117  bpm MSD:   mm    w     d CRL:  4.1  mm   6 w   1 d                  [place] EDC: 05/19/2017 Subchorionic hemorrhage:  None visualized. Maternal uterus/adnexae: 5.6 cm simple appearing cyst in the left ovary. No free fluid. IMPRESSION: Six week 1 day intrauterine pregnancy. Fetal heart rate 117 beats per minute. No acute maternal findings. 5.6 cm left ovarian simple appearing cyst. Electronically Signed   By: Rolm Baptise M.D.   On: 09/24/2016 10:28   US Ob Transvaginal  Result Date: 09/24/2016 CLINICAL DATA:  Abdominal pain affecting pregnancy. EXAM: OBSTETRIC <14 WK [place] AND TRANSVAGINAL OB US TECHNIQUE: Both transabdominal and transvaginal ultrasound examinations were performed for complete evaluation of the  gestation as well as the maternal uterus, adnexal regions, and pelvic cul-de-sac. Transvaginal technique was performed to assess early pregnancy. COMPARISON:  None. FINDINGS: Intrauterine gestational sac: Single Yolk sac:  Visualized Embryo:  Visualized Cardiac Activity: Visualized Heart Rate: 117  bpm MSD:   mm    w     d CRL:  4.1  mm   6 w   1 d                  [place] EDC: 05/19/2017 Subchorionic hemorrhage:  None visualized. Maternal uterus/adnexae: 5.6 cm simple appearing cyst in the left ovary. No free fluid. IMPRESSION: Six week 1 day intrauterine pregnancy. Fetal heart rate 117 beats per minute. No acute maternal findings. 5.6 cm left ovarian simple appearing cyst. Electronically Signed   By: Rolm Baptise M.D.   On: 09/24/2016 10:28    MDM +UPT UA, wet prep, GC/chlamydia, CBC, ABO/Rh, quant hCG, HIV, and [place] today to rule out ectopic pregnancy O positive Ultrasound shows SIUP measuring 27w1dwith cardiac activity and simple left ovarian cyst VSS, NAD Assessment and Plan  A: 1. Normal IUP (intrauterine pregnancy) on prenatal ultrasound, first trimester   2. Abdominal pain affecting pregnancy   3. Left ovarian cyst    P; Discharge home Take tylenol prn pain Discussed reasons to return to [facility]  EJorje Guild4/13/2018, 8:53 AM

## 2016-09-25 LAB — HIV ANTIBODY (ROUTINE TESTING W REFLEX): HIV SCREEN 4TH GENERATION: NONREACTIVE

## 2016-09-27 LAB — GC/CHLAMYDIA PROBE AMP (~~LOC~~) NOT AT ARMC
Chlamydia: NEGATIVE
Neisseria Gonorrhea: NEGATIVE

## 2016-10-02 ENCOUNTER — Encounter (HOSPITAL_COMMUNITY): Payer: Self-pay

## 2016-10-02 ENCOUNTER — Inpatient Hospital Stay (HOSPITAL_COMMUNITY): Payer: Medicaid Other

## 2016-10-02 ENCOUNTER — Inpatient Hospital Stay (HOSPITAL_COMMUNITY)
Admission: AD | Admit: 2016-10-02 | Discharge: 2016-10-02 | Disposition: A | Discharge status: Home / Self Care | Payer: Medicaid Other | Source: Ambulatory Visit | Attending: Obstetrics and Gynecology | Admitting: Obstetrics and Gynecology

## 2016-10-02 DIAGNOSIS — Z9049 Acquired absence of other specified parts of digestive tract: Secondary | ICD-10-CM | POA: Diagnosis not present

## 2016-10-02 DIAGNOSIS — O3481 Maternal care for other abnormalities of pelvic organs, first trimester: Secondary | ICD-10-CM | POA: Diagnosis not present

## 2016-10-02 DIAGNOSIS — O468X1 Other antepartum hemorrhage, first trimester: Secondary | ICD-10-CM | POA: Diagnosis not present

## 2016-10-02 DIAGNOSIS — O418X10 Other specified disorders of amniotic fluid and membranes, first trimester, not applicable or unspecified: Secondary | ICD-10-CM

## 2016-10-02 DIAGNOSIS — Z3A01 Less than 8 weeks gestation of pregnancy: Secondary | ICD-10-CM | POA: Insufficient documentation

## 2016-10-02 DIAGNOSIS — O208 Other hemorrhage in early pregnancy: Secondary | ICD-10-CM | POA: Insufficient documentation

## 2016-10-02 DIAGNOSIS — Z813 Family history of other psychoactive substance abuse and dependence: Secondary | ICD-10-CM | POA: Diagnosis not present

## 2016-10-02 DIAGNOSIS — Z833 Family history of diabetes mellitus: Secondary | ICD-10-CM | POA: Insufficient documentation

## 2016-10-02 DIAGNOSIS — N83202 Unspecified ovarian cyst, left side: Secondary | ICD-10-CM | POA: Diagnosis not present

## 2016-10-02 DIAGNOSIS — O209 Hemorrhage in early pregnancy, unspecified: Secondary | ICD-10-CM

## 2016-10-02 DIAGNOSIS — Z8249 Family history of ischemic heart disease and other diseases of the circulatory system: Secondary | ICD-10-CM | POA: Insufficient documentation

## 2016-10-02 DIAGNOSIS — Z811 Family history of alcohol abuse and dependence: Secondary | ICD-10-CM | POA: Diagnosis not present

## 2016-10-02 DIAGNOSIS — G43909 Migraine, unspecified, not intractable, without status migrainosus: Secondary | ICD-10-CM | POA: Insufficient documentation

## 2016-10-02 NOTE — Discharge Instructions (Signed)
Subchorionic Hematoma °A subchorionic hematoma is a gathering of blood between the outer wall of the placenta and the inner wall of the womb (uterus). The placenta is the organ that connects the fetus to the wall of the uterus. The placenta performs the feeding, breathing (oxygen to the fetus), and waste removal (excretory work) of the fetus. °Subchorionic hematoma is the most common abnormality found on a result from ultrasonography done during the first trimester or early second trimester of pregnancy. If there has been little or no vaginal bleeding, early small hematomas usually shrink on their own and do not affect your baby or pregnancy. The blood is gradually absorbed over 1-2 weeks. When bleeding starts later in pregnancy or the hematoma is larger or occurs in an older pregnant woman, the outcome may not be as good. Larger hematomas may get bigger, which increases the chances for miscarriage. Subchorionic hematoma also increases the risk of premature detachment of the placenta from the uterus, preterm (premature) labor, and stillbirth. °Follow these instructions at home: °· Stay on bed rest if your health care provider recommends this. Although bed rest will not prevent more bleeding or prevent a miscarriage, your health care provider may recommend bed rest until you are advised otherwise. °· Avoid heavy lifting (more than 10 lb [4.5 kg]), exercise, sexual intercourse, or douching as directed by your health care provider. °· Keep track of the number of pads you use each day and how soaked (saturated) they are. Write down this information. °· Do not use tampons. °· Keep all follow-up appointments as directed by your health care provider. Your health care provider may ask you to have follow-up blood tests or ultrasound tests or both. °Get help right away if: °· You have severe cramps in your stomach, back, abdomen, or pelvis. °· You have a fever. °· You pass large clots or tissue. Save any tissue for your  health care provider to look at. °· Your bleeding increases or you become lightheaded, feel weak, or have fainting episodes. °This information is not intended to replace advice given to you by your health care provider. Make sure you discuss any questions you have with your health care provider. °Document Released: 09/15/2006 Document Revised: 11/06/2015 Document Reviewed: 12/28/2012 °Elsevier Interactive Patient Education © 2017 [employer] ° °

## 2016-10-02 NOTE — MAU Provider Note (Signed)
Chief Complaint: Vaginal Bleeding   First Provider Initiated Contact with Patient 10/02/16 0218     SUBJECTIVE HPI: Erin Osborn is a 36 y.o. [id number] at [redacted]w[redacted]d by 6 week 1 day ultrasound who presents to Maternity Admissions reporting small amount of vaginal bleeding and "passing something". Thinks she had a miscarriage. Right by EMS. Denies abdominal pain or dizziness.  Live IUP confirmed in maternity admissions on 09/24/2016. Plans to get prenatal care [facility], but has not been seen by them yet.  Blood type O+.  Past Medical History:  Diagnosis Date  . Migraines    otc med prn  . SVD (spontaneous vaginal delivery)    x 2   OB History  Gravida Para Term Preterm AB Living  SAB TAB Ectopic Multiple Live Births  2     0 2    # Outcome Date GA Lbr Len/2nd Weight Sex Delivery Anes PTL Lv  5 Current           4 SAB 10/18/14          3 Term 03/09/08   5 lb (2.268 kg) F Vag-Spont  N LIV  2 Term 10/20/99   6 lb (2.722 kg) M Vag-Spont   LIV  1 SAB              Past Surgical History:  Procedure Laterality Date  . CHOLECYSTECTOMY     Gallstones removed-Around 36 years of age  . DILATION AND CURETTAGE OF UTERUS N/A 10/18/2014   Procedure: SUCTION DILATATION AND CURETTAGE;  Surgeon: Adam Phenix, MD;  Location: [facility];  Service: Gynecology;  Laterality: N/A;   Social History   Social History  . Marital status: Single    Spouse name: N/A  . Number of children: N/A  . Years of education: N/A   Occupational History  . Not on file.   Social History Main Topics  . Smoking status: Never Smoker  . Smokeless tobacco: Never Used  . Alcohol use No  . Drug use: No  . Sexual activity: Yes    Birth control/ protection: None   Other Topics Concern  . Not on file   Social History Narrative  . No narrative on file   Family History  Problem Relation Age of Onset  . Hypertension Mother   . Diabetes Mother   . Hypertension Maternal Grandmother   .  Diabetes Maternal Grandmother   . Alcohol abuse Father   . Drug abuse Father   . Hypertension Sister   . Drug abuse Brother   . Alcohol abuse Brother   . Acute myelogenous leukemia Neg Hx    No current facility-administered medications on file prior to encounter.    Current Outpatient Prescriptions on File Prior to Encounter  Medication Sig Dispense Refill  . Prenatal Multivit-Min-Fe-FA (PRENATAL VITAMINS) 0.8 MG tablet Take 1 tablet by mouth daily. 30 tablet 12   No Known Allergies  I have reviewed patient's Past Medical Hx, Surgical Hx, Family Hx, Social Hx, medications and allergies.   Review of Systems  Constitutional: Negative for chills and fever.  Gastrointestinal: Negative for abdominal pain.  Genitourinary: Positive for vaginal bleeding. Negative for vaginal discharge.  Musculoskeletal: Negative for back pain.  Neurological: Negative for dizziness.    OBJECTIVE Patient Vitals for the past 24 hrs:  BP Temp Temp src Pulse Resp SpO2  10/02/16 0207 120/88 97.5 F (36.4 C) Oral 79 18 100 %  Constitutional: Well-developed, well-nourished, Obese female in no acute distress. Anxious. Cardiovascular: normal rate Respiratory: normal rate and effort.  GI: Abd soft, non-tender. There is non-palpable. MS: Extremities nontender, no edema, normal ROM Neurologic: Alert and oriented x 4.  GU:  PELVIC EXAM: NEFG, scant dark red blood noted, cervix long/closed/firm; uterus 8-week size, no adnexal tenderness or masses. No CMT.  LAB RESULTS No results found for this or any previous visit (from the past 24 hour(s)).  IMAGING US Ob Transvaginal  Result Date: 10/02/2016 CLINICAL DATA:  Bleeding about 2 hours ago. Estimated gestational age by LMP is 8 weeks 6 days. Estimated gestational age by previous ultrasound is 7 weeks 2 days. Quantitative beta HCG on 09/24/2016 was 36,466. EXAM: TRANSVAGINAL OB ULTRASOUND TECHNIQUE: Transvaginal ultrasound was performed for complete evaluation of  the gestation as well as the maternal uterus, adnexal regions, and pelvic cul-de-sac. COMPARISON:  09/24/2016 FINDINGS: Intrauterine gestational sac: A single intrauterine pregnancy is identified. Yolk sac:  Yolk sac is present. Embryo:  Fetal pole is present. Cardiac Activity: Fetal cardiac activity is observed. Heart Rate: 140 bpm CRL:   13.2  mm   7 w 4 d                  [place] EDC: 05/17/2017 Subchorionic hemorrhage: Moderate subchorionic hemorrhage is present anteriorly and inferiorly. Maternal uterus/adnexae: No myometrial mass lesions are identified. Both ovaries are visualized. 5.3 cm diameter simple appearing cyst in the left ovary, possibly corpus luteal. No abnormal adnexal masses. Small amount of free fluid is seen in the pelvis. IMPRESSION: A single intrauterine pregnancy is identified. Estimated gestational age by crown-rump length is 7 weeks 4 days, representing appropriate interval growth since the previous study. Moderate subchorionic hemorrhage is identified. 5.3 cm diameter simple appearing cysts again demonstrated in the left ovary. Electronically Signed   By: Burman Nieves M.D.   On: 10/02/2016 03:17    MAU COURSE Orders Placed This Encounter  Procedures  . US OB Transvaginal  . Discharge patient   No orders of the defined types were placed in this encounter.   MDM - Vaginal bleeding due to subchorionic hemorrhage. Live IUP still seen on ultrasound. Hemodynamically stable.  ASSESSMENT 1. Subchorionic hemorrhage of placenta in first trimester, single or unspecified fetus   2. Vaginal bleeding in pregnancy, first trimester     PLAN Discharge home in stable condition. Bleeding precautions Pelvic rest x 1 week.  Follow-up Information    [facility] Follow up.   Why:  Start Prenatal care Contact information: [address]  [address] [place] [address] [phone]        [facility] [place] MATERNITY ADMISSIONS Follow up.   Why:   In emergencies Contact information: [address] [id number] mc [place] [address] [phone]         Allergies as of 10/02/2016   No Known Allergies     Medication List    TAKE these medications   Prenatal Vitamins 0.8 MG tablet Take 1 tablet by mouth daily.        [place], [place] 10/02/2016  3:43 AM

## 2016-10-02 NOTE — MAU Note (Signed)
Arrived by EMS.  Vaginal bleeding x 1 hour.  Passed something - brought with me, then Abd Cramping after that.

## 2016-10-04 ENCOUNTER — Other Ambulatory Visit: Payer: Self-pay | Admitting: Obstetrics and Gynecology

## 2016-10-06 ENCOUNTER — Ambulatory Visit: Payer: Medicaid Other | Admitting: Internal Medicine

## 2016-10-06 LAB — CYTOLOGY - PAP

## 2016-10-07 ENCOUNTER — Inpatient Hospital Stay (HOSPITAL_COMMUNITY): Payer: Medicaid Other

## 2016-10-07 ENCOUNTER — Inpatient Hospital Stay (HOSPITAL_COMMUNITY)
Admission: AD | Admit: 2016-10-07 | Discharge: 2016-10-07 | Disposition: A | Discharge status: Home / Self Care | Payer: Medicaid Other | Source: Ambulatory Visit | Attending: Obstetrics & Gynecology | Admitting: Obstetrics & Gynecology

## 2016-10-07 ENCOUNTER — Encounter (HOSPITAL_COMMUNITY): Payer: Self-pay | Admitting: *Deleted

## 2016-10-07 DIAGNOSIS — Z811 Family history of alcohol abuse and dependence: Secondary | ICD-10-CM | POA: Diagnosis not present

## 2016-10-07 DIAGNOSIS — Z9049 Acquired absence of other specified parts of digestive tract: Secondary | ICD-10-CM | POA: Insufficient documentation

## 2016-10-07 DIAGNOSIS — O468X1 Other antepartum hemorrhage, first trimester: Secondary | ICD-10-CM

## 2016-10-07 DIAGNOSIS — Z833 Family history of diabetes mellitus: Secondary | ICD-10-CM | POA: Insufficient documentation

## 2016-10-07 DIAGNOSIS — N939 Abnormal uterine and vaginal bleeding, unspecified: Secondary | ICD-10-CM

## 2016-10-07 DIAGNOSIS — O9989 Other specified diseases and conditions complicating pregnancy, childbirth and the puerperium: Secondary | ICD-10-CM | POA: Diagnosis not present

## 2016-10-07 DIAGNOSIS — Z8249 Family history of ischemic heart disease and other diseases of the circulatory system: Secondary | ICD-10-CM | POA: Diagnosis not present

## 2016-10-07 DIAGNOSIS — Z3A08 8 weeks gestation of pregnancy: Secondary | ICD-10-CM | POA: Diagnosis not present

## 2016-10-07 DIAGNOSIS — O3680X0 Pregnancy with inconclusive fetal viability, not applicable or unspecified: Secondary | ICD-10-CM | POA: Diagnosis not present

## 2016-10-07 DIAGNOSIS — Z813 Family history of other psychoactive substance abuse and dependence: Secondary | ICD-10-CM | POA: Diagnosis not present

## 2016-10-07 DIAGNOSIS — G43909 Migraine, unspecified, not intractable, without status migrainosus: Secondary | ICD-10-CM | POA: Insufficient documentation

## 2016-10-07 DIAGNOSIS — O208 Other hemorrhage in early pregnancy: Secondary | ICD-10-CM | POA: Diagnosis not present

## 2016-10-07 DIAGNOSIS — O418X10 Other specified disorders of amniotic fluid and membranes, first trimester, not applicable or unspecified: Secondary | ICD-10-CM

## 2016-10-07 DIAGNOSIS — R109 Unspecified abdominal pain: Secondary | ICD-10-CM | POA: Diagnosis present

## 2016-10-07 LAB — URINALYSIS, ROUTINE W REFLEX MICROSCOPIC
Bilirubin Urine: NEGATIVE
Glucose, UA: NEGATIVE mg/dL
Ketones, ur: 80 mg/dL — AB
Nitrite: NEGATIVE
Protein, ur: 30 mg/dL — AB
Specific Gravity, Urine: 1.024 (ref 1.005–1.030)
pH: 6 (ref 5.0–8.0)

## 2016-10-07 LAB — WET PREP, GENITAL
CLUE CELLS WET PREP: NONE SEEN
SPERM: NONE SEEN
Trich, Wet Prep: NONE SEEN
Yeast Wet Prep HPF POC: NONE SEEN

## 2016-10-07 NOTE — Discharge Instructions (Signed)
Subchorionic Hematoma °A subchorionic hematoma is a gathering of blood between the outer wall of the placenta and the inner wall of the womb (uterus). The placenta is the organ that connects the fetus to the wall of the uterus. The placenta performs the feeding, breathing (oxygen to the fetus), and waste removal (excretory work) of the fetus. °Subchorionic hematoma is the most common abnormality found on a result from ultrasonography done during the first trimester or early second trimester of pregnancy. If there has been little or no vaginal bleeding, early small hematomas usually shrink on their own and do not affect your baby or pregnancy. The blood is gradually absorbed over 1-2 weeks. When bleeding starts later in pregnancy or the hematoma is larger or occurs in an older pregnant woman, the outcome may not be as good. Larger hematomas may get bigger, which increases the chances for miscarriage. Subchorionic hematoma also increases the risk of premature detachment of the placenta from the uterus, preterm (premature) labor, and stillbirth. °Follow these instructions at home: °· Stay on bed rest if your health care provider recommends this. Although bed rest will not prevent more bleeding or prevent a miscarriage, your health care provider may recommend bed rest until you are advised otherwise. °· Avoid heavy lifting (more than 10 lb [4.5 kg]), exercise, sexual intercourse, or douching as directed by your health care provider. °· Keep track of the number of pads you use each day and how soaked (saturated) they are. Write down this information. °· Do not use tampons. °· Keep all follow-up appointments as directed by your health care provider. Your health care provider may ask you to have follow-up blood tests or ultrasound tests or both. °Get help right away if: °· You have severe cramps in your stomach, back, abdomen, or pelvis. °· You have a fever. °· You pass large clots or tissue. Save any tissue for your  health care provider to look at. °· Your bleeding increases or you become lightheaded, feel weak, or have fainting episodes. °This information is not intended to replace advice given to you by your health care provider. Make sure you discuss any questions you have with your health care provider. °Document Released: 09/15/2006 Document Revised: 11/06/2015 Document Reviewed: 12/28/2012 °Elsevier Interactive Patient Education © 2017 [employer] ° °

## 2016-10-07 NOTE — MAU Note (Signed)
Pt reports she has been having abd cramping on and off since 2:30. Has noticed some brown spotting as well.

## 2016-10-07 NOTE — MAU Provider Note (Signed)
Patient Erin Osborn is a 36 year old G5P2022  at [redacted]w[redacted]d Here with complaints of abdominal pain and spotting since 2 am.  History     CSN: [id number]  Arrival date and time: 10/07/16 4098   None     Chief Complaint  Patient presents with  . Abdominal Cramping  . Vaginal Bleeding   Abdominal Pain  This is a new problem. The current episode started today. The onset quality is sudden. The problem has been gradually improving. The pain is located in the suprapubic region. The pain is at a severity of 4/10. The pain is mild. The abdominal pain radiates to the suprapubic region. Pertinent negatives include no constipation, diarrhea, nausea or vomiting. Nothing aggravates the pain. The pain is relieved by nothing.  Vaginal Bleeding  The patient's primary symptoms include vaginal bleeding. The patient's pertinent negatives include no genital itching, genital lesions, genital odor or pelvic pain. This is a new problem. The current episode started today. The problem occurs 2 to 4 times per day. The problem has been unchanged. She is pregnant. Associated symptoms include abdominal pain. Pertinent negatives include no constipation, diarrhea, nausea or vomiting. The vaginal discharge was bloody and dark. The vaginal bleeding is spotting. She has not been passing clots. She has not been passing tissue.    OB History    This patient's OB History needs to be verified. Open OB History to review and resolve any issues.   Gravida Para Term Preterm AB Living   SAB TAB Ectopic Multiple Live Births   2     0 2      Past Medical History:  Diagnosis Date  . Migraines    otc med prn  . SVD (spontaneous vaginal delivery)    x 2    Past Surgical History:  Procedure Laterality Date  . CHOLECYSTECTOMY     Gallstones removed-Around 36 years of age  . DILATION AND CURETTAGE OF UTERUS N/A 10/18/2014   Procedure: SUCTION DILATATION AND CURETTAGE;  Surgeon: Adam Phenix, MD;  Location: [facility];  Service: Gynecology;  Laterality: N/A;    Family History  Problem Relation Age of Onset  . Hypertension Mother   . Diabetes Mother   . Hypertension Maternal Grandmother   . Diabetes Maternal Grandmother   . Alcohol abuse Father   . Drug abuse Father   . Hypertension Sister   . Drug abuse Brother   . Alcohol abuse Brother   . Acute myelogenous leukemia Neg Hx     Social History  Substance Use Topics  . Smoking status: Never Smoker  . Smokeless tobacco: Never Used  . Alcohol use No    Allergies: No Known Allergies  Prescriptions Prior to Admission  Medication Sig Dispense Refill Last Dose  . Prenatal Multivit-Min-Fe-FA (PRENATAL VITAMINS) 0.8 MG tablet Take 1 tablet by mouth daily. 30 tablet 12 10/01/2016 at Unknown time    Review of Systems  HENT: Negative.   Respiratory: Negative.   Cardiovascular: Negative.   Gastrointestinal: Positive for abdominal pain. Negative for constipation, diarrhea, nausea and vomiting.  Genitourinary: Positive for vaginal bleeding. Negative for pelvic pain.  Musculoskeletal: Negative.   Neurological: Negative.    Physical Exam   Blood pressure 117/61, pulse 77, temperature 98.6 F (37 C), resp. rate 18, height  (1.575 m), weight 206 lb 0.6 oz (93.5 kg), last menstrual period 08/01/2016.  Physical Exam  Constitutional: She is oriented to person,  place, and time. She appears well-developed and well-nourished.  HENT:  Head: Normocephalic.  Eyes: Pupils are equal, round, and reactive to light.  Neck: Normal range of motion.  Respiratory: Effort normal.  GI: Soft.  Genitourinary:  Genitourinary Comments: NEFG; no blood in the vagina. No lesions on the vaginal walls, cervix is pink with no lesions. No CMT, no suprapubic tenderness, no adnexal tenderness.   Musculoskeletal: Normal range of motion.  Neurological: She is alert and oriented to person, place, and time.  Skin: Skin is warm and dry.  Psychiatric: She has a normal mood  and affect.    MAU Course  Procedures -[place] for viability; PO hydration MDM Results for orders placed or performed during the hospital encounter of 10/07/16 (from the past 24 hour(s))  Urinalysis, Routine w reflex microscopic     Status: Abnormal   Collection Time: 10/07/16  8:47 AM  Result Value Ref Range   Color, Urine YELLOW YELLOW   APPearance HAZY (A) CLEAR   Specific Gravity, Urine 1.024 1.005 - 1.030   pH 6.0 5.0 - 8.0   Glucose, UA NEGATIVE NEGATIVE mg/dL   Hgb urine dipstick MODERATE (A) NEGATIVE   Bilirubin Urine NEGATIVE NEGATIVE   Ketones, ur 80 (A) NEGATIVE mg/dL   Protein, ur 30 (A) NEGATIVE mg/dL   Nitrite NEGATIVE NEGATIVE   Leukocytes, UA MODERATE (A) NEGATIVE   RBC / HPF 0-5 0 - 5 RBC/hpf   WBC, UA 6-30 0 - 5 WBC/hpf   Bacteria, UA RARE (A) NONE SEEN   Squamous Epithelial / LPF 0-5 (A) NONE SEEN   Mucous PRESENT   Wet prep, genital     Status: Abnormal   Collection Time: 10/07/16  9:41 AM  Result Value Ref Range   Yeast Wet Prep HPF POC NONE SEEN NONE SEEN   Trich, Wet Prep NONE SEEN NONE SEEN   Clue Cells Wet Prep HPF POC NONE SEEN NONE SEEN   WBC, Wet Prep HPF POC FEW (A) NONE SEEN   Sperm NONE SEEN       US Ob Transvaginal  Result Date: 10/07/2016 CLINICAL DATA:  Vaginal bleeding for 1 day EXAM: TRANSVAGINAL OB ULTRASOUND TECHNIQUE: Transvaginal ultrasound was performed for complete evaluation of the gestation as well as the maternal uterus, adnexal regions, and pelvic cul-de-sac. COMPARISON:  10/02/2016 FINDINGS: Intrauterine gestational sac: Single Yolk sac:  Visualized Embryo:  Visualized. Cardiac Activity: Visualized. Heart Rate: 167 bpm MSD:   mm    w     d CRL:   17.9  mm   8 w 1 d                  [place] EDC: 05/18/2017 Subchorionic hemorrhage:  Small subchorionic hemorrhage Maternal uterus/adnexae: 5.9 cm simple cyst within the left ovary IMPRESSION: Eight week 1 day intrauterine pregnancy. Fetal heart rate 167 beats per minute. Small subchorionic  hemorrhage, decreased in size since prior study 5.9 cm simple appearing cyst in the left ovary. Electronically Signed   By: Charlett Nose M.D.   On: 10/07/2016 10:40      Assessment and Plan   1. Subchorionic hemorrhage of placenta in first trimester, single or unspecified fetus   2. Pregnancy with uncertain fetal viability   3. Vaginal bleeding    2. Patient stable for discharge; reviewed when to return to the MAU (heavy bleeding, passing of large clots, severe abdominal pain). Plan of care reviewed with Dr. Senaida Ores, who agrees.  3. Patient will keep her next  OB appointment Charlesetta Garibaldi Lache Dagher CNM 10/07/2016, 9:25 AM

## 2016-10-08 LAB — GC/CHLAMYDIA PROBE AMP (~~LOC~~) NOT AT ARMC
CHLAMYDIA, DNA PROBE: NEGATIVE
NEISSERIA GONORRHEA: NEGATIVE

## 2016-11-07 ENCOUNTER — Inpatient Hospital Stay (HOSPITAL_COMMUNITY): Payer: Medicaid Other

## 2016-11-07 ENCOUNTER — Inpatient Hospital Stay (HOSPITAL_COMMUNITY)
Admission: AD | Admit: 2016-11-07 | Discharge: 2016-11-07 | Disposition: A | Discharge status: Home / Self Care | Payer: Medicaid Other | Source: Ambulatory Visit | Attending: Obstetrics and Gynecology | Admitting: Obstetrics and Gynecology

## 2016-11-07 ENCOUNTER — Encounter (HOSPITAL_COMMUNITY): Payer: Self-pay | Admitting: *Deleted

## 2016-11-07 DIAGNOSIS — O209 Hemorrhage in early pregnancy, unspecified: Secondary | ICD-10-CM

## 2016-11-07 DIAGNOSIS — O26892 Other specified pregnancy related conditions, second trimester: Secondary | ICD-10-CM | POA: Diagnosis present

## 2016-11-07 DIAGNOSIS — R109 Unspecified abdominal pain: Secondary | ICD-10-CM | POA: Diagnosis present

## 2016-11-07 DIAGNOSIS — O034 Incomplete spontaneous abortion without complication: Secondary | ICD-10-CM | POA: Diagnosis not present

## 2016-11-07 LAB — URINALYSIS, ROUTINE W REFLEX MICROSCOPIC
Bilirubin Urine: NEGATIVE
GLUCOSE, UA: NEGATIVE mg/dL
KETONES UR: 20 mg/dL — AB
NITRITE: NEGATIVE
PROTEIN: 30 mg/dL — AB
Specific Gravity, Urine: 1.024 (ref 1.005–1.030)
pH: 5 (ref 5.0–8.0)

## 2016-11-07 LAB — CBC
HCT: 35.6 % — ABNORMAL LOW (ref 36.0–46.0)
HEMOGLOBIN: 12 g/dL (ref 12.0–15.0)
MCH: 29.3 pg (ref 26.0–34.0)
MCHC: 33.7 g/dL (ref 30.0–36.0)
MCV: 87 fL (ref 78.0–100.0)
Platelets: 279 10*3/uL (ref 150–400)
RBC: 4.09 MIL/uL (ref 3.87–5.11)
RDW: 14.8 % (ref 11.5–15.5)
WBC: 10.6 10*3/uL — AB (ref 4.0–10.5)

## 2016-11-07 MED ORDER — IBUPROFEN 600 MG PO TABS: 600.0000 mg | ORAL_TABLET | Freq: Four times a day (QID) | ORAL | 0 refills | Status: DC | PRN

## 2016-11-07 MED ORDER — HYDROCODONE-ACETAMINOPHEN 5-325 MG PO TABS: 1.0000 | ORAL_TABLET | ORAL | 0 refills | Status: DC | PRN

## 2016-11-07 NOTE — MAU Note (Signed)
Having spotting and cramping since 1700 on Sat. Spotting is brown but looks like is trying to turn red. Mostly see spotting when I wipe

## 2016-11-07 NOTE — Discharge Instructions (Signed)

## 2016-11-07 NOTE — MAU Provider Note (Signed)
History     CSN: [id number]  Arrival date and time: 11/07/16 [id number]   First Provider Initiated Contact with Patient 11/07/16 [phone]      Chief Complaint  Patient presents with  . Abdominal Cramping  . Vaginal Bleeding   HPI   Erin Osborn is a 36 y.o. female [phone] @ 6084w3d here in MAU with vaginal bleeding and abdominal pain. Symptoms started just prior to her arrival to [place]. She has had these symptoms in the pregnancy a few weeks ago and they resolved. The vaginal bleeding is dark brown, no bright red bleeding. No recent intercourse.   OB History    This patient's OB History needs to be verified. Open OB History to review and resolve any issues.   Gravida Para Term Preterm AB Living   5 2 2   2 2    SAB TAB Ectopic Multiple Live Births   2     0 2      Past Medical History:  Diagnosis Date  . Migraines    otc med prn  . SVD (spontaneous vaginal delivery)    x 2    Past Surgical History:  Procedure Laterality Date  . CHOLECYSTECTOMY     Gallstones removed-Around 36 years of age  . DILATION AND CURETTAGE OF UTERUS N/A 10/18/2014   Procedure: SUCTION DILATATION AND CURETTAGE;  Surgeon: Adam PhenixJames G Arnold, MD;  Location: [facility];  Service: Gynecology;  Laterality: N/A;    Family History  Problem Relation Age of Onset  . Hypertension Mother   . Diabetes Mother   . Hypertension Maternal Grandmother   . Diabetes Maternal Grandmother   . Alcohol abuse Father   . Drug abuse Father   . Hypertension Sister   . Drug abuse Brother   . Alcohol abuse Brother   . Acute myelogenous leukemia Neg Hx     Social History  Substance Use Topics  . Smoking status: Never Smoker  . Smokeless tobacco: Never Used  . Alcohol use No    Allergies: No Known Allergies  Prescriptions Prior to Admission  Medication Sig Dispense Refill Last Dose  . Prenatal Multivit-Min-Fe-FA (PRENATAL VITAMINS) 0.8 MG tablet Take 1 tablet by mouth daily. 30 tablet 12 11/06/2016 at Unknown time    Results for orders placed or performed during the hospital encounter of 11/07/16 (from the past 48 hour(s))  Urinalysis, Routine w reflex microscopic     Status: Abnormal   Collection Time: 11/07/16  2:50 AM  Result Value Ref Range   Color, Urine AMBER (A) YELLOW    Comment: BIOCHEMICALS MAY BE AFFECTED BY COLOR   APPearance CLOUDY (A) CLEAR   Specific Gravity, Urine 1.024 1.005 - 1.030   pH 5.0 5.0 - 8.0   Glucose, UA NEGATIVE NEGATIVE mg/dL   Hgb urine dipstick LARGE (A) NEGATIVE   Bilirubin Urine NEGATIVE NEGATIVE   Ketones, ur 20 (A) NEGATIVE mg/dL   Protein, ur 30 (A) NEGATIVE mg/dL   Nitrite NEGATIVE NEGATIVE   Leukocytes, UA SMALL (A) NEGATIVE   RBC / HPF TOO NUMEROUS TO COUNT 0 - 5 RBC/hpf   WBC, UA TOO NUMEROUS TO COUNT 0 - 5 WBC/hpf   Bacteria, UA FEW (A) NONE SEEN   Squamous Epithelial / LPF 0-5 (A) NONE SEEN   Mucous PRESENT    Ca Oxalate Crys, UA PRESENT   CBC     Status: Abnormal   Collection Time: 11/07/16  3:36 AM  Result Value Ref Range   WBC 10.6 (  H) 4.0 - 10.5 K/uL   RBC 4.09 3.87 - 5.11 MIL/uL   Hemoglobin 12.0 12.0 - 15.0 g/dL   HCT 09.8 (L) 11.9 - 14.7 %   MCV 87.0 78.0 - 100.0 fL   MCH 29.3 26.0 - 34.0 pg   MCHC 33.7 30.0 - 36.0 g/dL   RDW 82.9 56.2 - 13.0 %   Platelets 279 150 - 400 K/uL   US Ob Comp Less 14 Wks  Addendum Date: 11/07/2016   ADDENDUM REPORT: 11/07/2016 04:40 ADDENDUM: Critical Value/emergent results were called by telephone at the time of interpretation on 11/07/2016 at 4:35 am to Dr. Victorino Dike [facility] , who verbally acknowledged these results. Electronically Signed   By: Awilda Metro M.D.   On: 11/07/2016 04:40   Result Date: 11/07/2016 CLINICAL DATA:  Vaginal bleeding, first trimester. EXAM: OBSTETRIC <14 WK ULTRASOUND TECHNIQUE: Transabdominal ultrasound was performed for evaluation of the gestation as well as the maternal uterus and adnexal regions. COMPARISON:  Obstetric ultrasound October 07, 2016 FINDINGS: Intrauterine  gestational sac: Present though, abnormal shape. Yolk sac:  Not present Embryo:  Present Cardiac Activity: Not present CRL:   24  mm   9 w 1 d Subchorionic hemorrhage: Small residual anechoic subchorionic hemorrhage. Maternal uterus/adnexae: 2.6 x 1.7 cm LEFT adnexal cyst was 5.9 cm. IMPRESSION: Findings meet definitive criteria for failed pregnancy. This follows SRU consensus guidelines: Diagnostic Criteria for Nonviable Pregnancy Early in the First Trimester. [facility] [phone]. Electronically Signed: By: Awilda Metro M.D. On: 11/07/2016 04:27    Review of Systems  Gastrointestinal: Positive for abdominal pain (Lower abdominal cramping ).  Genitourinary: Positive for vaginal bleeding and vaginal discharge. Negative for dysuria.   Physical Exam   Blood pressure 114/86, pulse 99, temperature 98.5 F (36.9 C), resp. rate 18, height 5' (1.524 m), weight 200 lb (90.7 kg), last menstrual period 08/01/2016, SpO2 100 %.  Physical Exam  Constitutional: She is oriented to person, place, and time. She appears well-developed and well-nourished. No distress.  HENT:  Head: Normocephalic.  Eyes: Pupils are equal, round, and reactive to light.  Genitourinary:  Genitourinary Comments: Bimanual exam: Cervix FT, thin.  Uterus non tender, enlarged  Adnexa non tender, no masses bilaterally Chaperone present for exam.   Musculoskeletal: Normal range of motion.  Neurological: She is alert and oriented to person, place, and time.  Skin: Skin is warm. She is not diaphoretic.  Psychiatric: Her behavior is normal.    MAU Course  Procedures  None  MDM  O positive blood type Urine culture pending  Unable to doppler fetal heart tones US OB ordered  Called Dr. Jackelyn Knife @ [phone]; patient upset and ready to leave MAU.    Assessment and Plan   A:  1. Incomplete miscarriage @ [redacted]w[redacted]d  2. Vaginal bleeding in pregnancy, first trimester     P:  Discharge home in stable condition Rx:  Vicodin, ibuprofen  Return to MAU if symptoms worsen Call the office first thing on Tuesday morning for further management.  Bleeding precautions.    Venia Carbon I, NP 11/07/2016 4:55 AM

## 2016-11-07 NOTE — Progress Notes (Signed)
Pt to call the office Tues am for appt. To return to MAU sooner for heavy bleeding, severe abd pain not controlled by pain meds, or any other concerns

## 2016-11-07 NOTE — Progress Notes (Signed)
Jennifer Rasch Np in to discuss test results and d/c plan. Written and verbal d/c instructions given and understanding voiced. 

## 2016-11-08 LAB — CULTURE, OB URINE: Special Requests: NORMAL

## 2016-11-10 ENCOUNTER — Other Ambulatory Visit: Payer: Self-pay | Admitting: Obstetrics and Gynecology

## 2016-11-11 ENCOUNTER — Encounter (HOSPITAL_COMMUNITY): Payer: Self-pay

## 2016-11-12 ENCOUNTER — Encounter (HOSPITAL_COMMUNITY)
Admission: RE | Disposition: A | Discharge status: Home / Self Care | Payer: Self-pay | Source: Ambulatory Visit | Attending: Obstetrics and Gynecology

## 2016-11-12 ENCOUNTER — Encounter (HOSPITAL_COMMUNITY): Payer: Self-pay | Admitting: Anesthesiology

## 2016-11-12 ENCOUNTER — Ambulatory Visit (HOSPITAL_COMMUNITY)
Admission: RE | Admit: 2016-11-12 | Discharge: 2016-11-12 | Disposition: A | Discharge status: Home / Self Care | Payer: Medicaid Other | Source: Ambulatory Visit | Attending: Obstetrics and Gynecology | Admitting: Obstetrics and Gynecology

## 2016-11-12 ENCOUNTER — Ambulatory Visit (HOSPITAL_COMMUNITY): Payer: Medicaid Other | Admitting: Anesthesiology

## 2016-11-12 DIAGNOSIS — Z9049 Acquired absence of other specified parts of digestive tract: Secondary | ICD-10-CM | POA: Diagnosis not present

## 2016-11-12 DIAGNOSIS — O021 Missed abortion: Secondary | ICD-10-CM | POA: Insufficient documentation

## 2016-11-12 HISTORY — PX: DILATION AND EVACUATION: SHX1459

## 2016-11-12 SURGERY — DILATION AND EVACUATION, UTERUS
Anesthesia: General

## 2016-11-12 MED ORDER — OXYCODONE-ACETAMINOPHEN 5-325 MG PO TABS: 1.0000 | ORAL_TABLET | Freq: Four times a day (QID) | ORAL | 0 refills | Status: DC | PRN

## 2016-11-12 MED ORDER — SCOPOLAMINE 1 MG/3DAYS TD PT72
1.0000 | MEDICATED_PATCH | Freq: Once | TRANSDERMAL | Status: DC
  Administered 2016-11-12: 1.5 mg via TRANSDERMAL

## 2016-11-12 MED ORDER — LACTATED RINGERS IV SOLN
INTRAVENOUS | Status: DC
  Administered 2016-11-12: 15:00:00 via INTRAVENOUS

## 2016-11-12 MED ORDER — MIDAZOLAM HCL 2 MG/2ML IJ SOLN
INTRAMUSCULAR | Status: DC | PRN
  Administered 2016-11-12: 2 mg via INTRAVENOUS

## 2016-11-12 MED ORDER — FENTANYL CITRATE (PF) 100 MCG/2ML IJ SOLN
INTRAMUSCULAR | Status: DC | PRN
Start: 2016-11-12 — End: 2016-11-12
  Administered 2016-11-12: 100 ug via INTRAVENOUS

## 2016-11-12 MED ORDER — SCOPOLAMINE 1 MG/3DAYS TD PT72
MEDICATED_PATCH | TRANSDERMAL | Status: AC
  Administered 2016-11-12: 1.5 mg via TRANSDERMAL
  Filled 2016-11-12: qty 1

## 2016-11-12 MED ORDER — LIDOCAINE HCL (CARDIAC) 20 MG/ML IV SOLN
INTRAVENOUS | Status: DC | PRN
  Administered 2016-11-12: 50 mg via INTRAVENOUS

## 2016-11-12 MED ORDER — FENTANYL CITRATE (PF) 100 MCG/2ML IJ SOLN: 25.0000 ug | INTRAMUSCULAR | Status: DC | PRN

## 2016-11-12 MED ORDER — ONDANSETRON HCL 4 MG/2ML IJ SOLN: 4.0000 mg | Freq: Once | INTRAMUSCULAR | Status: DC | PRN

## 2016-11-12 MED ORDER — MIDAZOLAM HCL 2 MG/2ML IJ SOLN
INTRAMUSCULAR | Status: AC
  Filled 2016-11-12: qty 2

## 2016-11-12 MED ORDER — LIDOCAINE HCL (CARDIAC) 20 MG/ML IV SOLN
INTRAVENOUS | Status: AC
  Filled 2016-11-12: qty 5

## 2016-11-12 MED ORDER — FENTANYL CITRATE (PF) 100 MCG/2ML IJ SOLN
INTRAMUSCULAR | Status: AC
  Filled 2016-11-12: qty 2

## 2016-11-12 MED ORDER — LIDOCAINE HCL 1 % IJ SOLN
INTRAMUSCULAR | Status: AC
  Filled 2016-11-12: qty 20

## 2016-11-12 MED ORDER — KETOROLAC TROMETHAMINE 30 MG/ML IJ SOLN
INTRAMUSCULAR | Status: AC
  Filled 2016-11-12: qty 1

## 2016-11-12 MED ORDER — BACITRACIN-NEOMYCIN-POLYMYXIN 400-5-5000 EX OINT
TOPICAL_OINTMENT | CUTANEOUS | Status: AC
  Filled 2016-11-12: qty 1

## 2016-11-12 MED ORDER — MEPERIDINE HCL 25 MG/ML IJ SOLN: 6.2500 mg | INTRAMUSCULAR | Status: DC | PRN

## 2016-11-12 MED ORDER — MEDROXYPROGESTERONE ACETATE 150 MG/ML IM SUSP: INTRAMUSCULAR | 3 refills | Status: DC

## 2016-11-12 MED ORDER — DOXYCYCLINE HYCLATE 100 MG IV SOLR
100.0000 mg | Freq: Once | INTRAVENOUS | Status: AC
  Administered 2016-11-12: 100 mg via INTRAVENOUS
  Filled 2016-11-12: qty 100

## 2016-11-12 MED ORDER — PROPOFOL 10 MG/ML IV BOLUS
INTRAVENOUS | Status: AC
  Filled 2016-11-12: qty 20

## 2016-11-12 MED ORDER — DOXYCYCLINE HYCLATE 100 MG PO TABS: 100.0000 mg | ORAL_TABLET | Freq: Every day | ORAL | Status: DC

## 2016-11-12 MED ORDER — MEDROXYPROGESTERONE ACETATE 150 MG/ML IM SUSP: 150.0000 mg | Freq: Once | INTRAMUSCULAR | Status: DC

## 2016-11-12 MED ORDER — LIDOCAINE HCL 1 % IJ SOLN
INTRAMUSCULAR | Status: DC | PRN
  Administered 2016-11-12: 10 mL

## 2016-11-12 MED ORDER — ONDANSETRON HCL 4 MG/2ML IJ SOLN
INTRAMUSCULAR | Status: DC | PRN
  Administered 2016-11-12: 4 mg via INTRAVENOUS

## 2016-11-12 MED ORDER — OXYCODONE-ACETAMINOPHEN 5-325 MG PO TABS: 1.0000 | ORAL_TABLET | Freq: Four times a day (QID) | ORAL | Status: DC | PRN

## 2016-11-12 MED ORDER — PROPOFOL 10 MG/ML IV BOLUS
INTRAVENOUS | Status: DC | PRN
  Administered 2016-11-12: 200 mg via INTRAVENOUS

## 2016-11-12 MED ORDER — KETOROLAC TROMETHAMINE 30 MG/ML IJ SOLN
INTRAMUSCULAR | Status: DC | PRN
  Administered 2016-11-12: 30 mg via INTRAVENOUS

## 2016-11-12 MED ORDER — DOXYCYCLINE HYCLATE 100 MG PO TABS: ORAL_TABLET | ORAL | 0 refills | Status: DC

## 2016-11-12 MED ORDER — ONDANSETRON HCL 4 MG/2ML IJ SOLN
INTRAMUSCULAR | Status: AC
  Filled 2016-11-12: qty 2

## 2016-11-12 SURGICAL SUPPLY — 20 items
CATH ROBINSON RED A/P 16FR (CATHETERS) ×3 IMPLANT
CLOTH BEACON ORANGE TIMEOUT ST (SAFETY) ×3 IMPLANT
DECANTER SPIKE VIAL GLASS SM (MISCELLANEOUS) ×3 IMPLANT
GLOVE BIOGEL PI IND STRL 6.5 (GLOVE) ×1 IMPLANT
GLOVE BIOGEL PI IND STRL 7.0 (GLOVE) ×1 IMPLANT
GLOVE BIOGEL PI INDICATOR 6.5 (GLOVE) ×2
GLOVE BIOGEL PI INDICATOR 7.0 (GLOVE) ×2
GLOVE ECLIPSE 6.5 STRL STRAW (GLOVE) ×3 IMPLANT
GOWN STRL REUS W/TWL LRG LVL3 (GOWN DISPOSABLE) ×6 IMPLANT
KIT BERKELEY 1ST TRIMESTER 3/8 (MISCELLANEOUS) ×3 IMPLANT
NS IRRIG 1000ML POUR BTL (IV SOLUTION) ×3 IMPLANT
PACK VAGINAL MINOR WOMEN LF (CUSTOM PROCEDURE TRAY) ×3 IMPLANT
PAD OB MATERNITY 4.3X12.25 (PERSONAL CARE ITEMS) ×3 IMPLANT
PAD PREP 24X48 CUFFED NSTRL (MISCELLANEOUS) ×3 IMPLANT
SET BERKELEY SUCTION TUBING (SUCTIONS) ×3 IMPLANT
TOWEL OR 17X24 6PK STRL BLUE (TOWEL DISPOSABLE) ×6 IMPLANT
VACURETTE 10 RIGID CVD (CANNULA) ×2 IMPLANT
VACURETTE 7MM CVD STRL WRAP (CANNULA) IMPLANT
VACURETTE 8 RIGID CVD (CANNULA) IMPLANT
VACURETTE 9 RIGID CVD (CANNULA) IMPLANT

## 2016-11-12 NOTE — Transfer of Care (Signed)
Immediate Anesthesia Transfer of Care Note  Patient: Erin Osborn  Procedure(s) Performed: Procedure(s): DILATATION AND EVACUATION (N/A)  Patient Location: PACU  Anesthesia Type:General  Level of Consciousness: awake, alert  and oriented  Airway & Oxygen Therapy: Patient Spontanous Breathing and Patient connected to nasal cannula oxygen  Post-op Assessment: Report given to RN and Post -op Vital signs reviewed and stable  Post vital signs: Reviewed and stable  Last Vitals:  Vitals:   11/12/16 1414  BP: (!) 140/97  Pulse: 69  Resp: 18  Temp: 36.8 C    Last Pain:  Vitals:   11/12/16 1414  TempSrc: Oral      Patients Stated Pain Goal: 3 (11/12/16 1414)  Complications: No apparent anesthesia complications

## 2016-11-12 NOTE — Discharge Instructions (Addendum)
DISCHARGE INSTRUCTIONS: D&C / D&E °The following instructions have been prepared to help you care for yourself upon your return home. °  °Personal hygiene: °• Use sanitary pads for vaginal drainage, not tampons. °• Shower the day after your procedure. °• NO tub baths, pools or Jacuzzis for 2-3 weeks. °• Wipe front to back after using the bathroom. ° °Activity and limitations: °• Do NOT drive or operate any equipment for 24 hours. The effects of anesthesia are still present and drowsiness may result. °• Do NOT rest in bed all day. °• Walking is encouraged. °• Walk up and down stairs slowly. °• You may resume your normal activity in one to two days or as indicated by your physician. ° °Sexual activity: NO intercourse for at least 2 weeks after the procedure, or as indicated by your physician. ° °Diet: Eat a light meal as desired this evening. You may resume your usual diet tomorrow. ° °Return to work: You may resume your work activities in one to two days or as indicated by your doctor. ° °What to expect after your surgery: Expect to have vaginal bleeding/discharge for 2-3 days and spotting for up to 10 days. It is not unusual to have soreness for up to 1-2 weeks. You may have a slight burning sensation when you urinate for the first day. Mild cramps may continue for a couple of days. You may have a regular period in 2-6 weeks. ° °Call your doctor for any of the following: °• Excessive vaginal bleeding, saturating and changing one pad every hour. °• Inability to urinate 6 hours after discharge from hospital. °• Pain not relieved by pain medication. °• Fever of 100.4° F or greater. °• Unusual vaginal discharge or odor. ° ° Call for an appointment:  ° ° °Patient’s signature: ______________________ ° °Nurse’s signature ________________________ ° °Support person's signature_______________________ ° °Post Anesthesia Home Care Instructions ° °NO IBUPROFEN PRODUCTS UNTIL: 9:45 PM TONIGHT ° °Activity: °Get plenty of rest for  the remainder of the day. A responsible individual must stay with you for 24 hours following the procedure.  °For the next 24 hours, DO NOT: °-Drive a car °-Operate machinery °-Drink alcoholic beverages °-Take any medication unless instructed by your physician °-Make any legal decisions or sign important papers. ° °Meals: °Start with liquid foods such as gelatin or soup. Progress to regular foods as tolerated. Avoid greasy, spicy, heavy foods. If nausea and/or vomiting occur, drink only clear liquids until the nausea and/or vomiting subsides. Call your physician if vomiting continues. ° °Special Instructions/Symptoms: °Your throat may feel dry or sore from the anesthesia or the breathing tube placed in your throat during surgery. If this causes discomfort, gargle with warm salt water. The discomfort should disappear within 24 hours. ° °If you had a scopolamine patch placed behind your ear for the management of post- operative nausea and/or vomiting: ° °1. The medication in the patch is effective for 72 hours, after which it should be removed.  Wrap patch in a tissue and discard in the trash. Wash hands thoroughly with soap and water. °2. You may remove the patch earlier than 72 hours if you experience unpleasant side effects which may include dry mouth, dizziness or visual disturbances. °3. Avoid touching the patch. Wash your hands with soap and water after contact with the patch. °  ° ° ° °

## 2016-11-12 NOTE — Brief Op Note (Signed)
11/12/2016  3:51 PM  PATIENT:  Daijah R Study  36 y.o. female  PRE-OPERATIVE DIAGNOSIS:  MAB 9 weeks POST-OPERATIVE DIAGNOSIS:  Missed Abortion  PROCEDURE:  Procedure(s): DILATATION AND EVACUATION (N/A)  SURGEON:  Surgeon(s) and Role:    Claiborne Billings* Zackarey Holleman, Luther ParodySidney, DO - Primary   ANESTHESIA:   LMA  EBL:  Total I/O In: -  Out: 65 [Urine:15; Blood:50]  BLOOD ADMINISTERED:none  DRAINS: none   LOCAL MEDICATIONS USED:  XYLOCAINE   SPECIMEN:  Source of Specimen:  POC  DISPOSITION OF SPECIMEN:  PATHOLOGY  COUNTS:  YES  PLAN OF CARE: Discharge to home after PACU  PATIENT DISPOSITION:  PACU - hemodynamically stable.   Delay start of Pharmacological VTE agent (>24hrs) due to surgical blood loss or risk of bleeding: not applicable

## 2016-11-12 NOTE — Anesthesia Postprocedure Evaluation (Signed)
Anesthesia Post Note  Patient: Dalisa R Mcneely  Procedure(s) Performed: Procedure(s) (LRB): DILATATION AND EVACUATION (N/A)     Patient location during evaluation: PACU Anesthesia Type: General Level of consciousness: awake and alert Pain management: pain level controlled Vital Signs Assessment: post-procedure vital signs reviewed and stable Respiratory status: spontaneous breathing, nonlabored ventilation, respiratory function stable and patient connected to nasal cannula oxygen Cardiovascular status: blood pressure returned to baseline and stable Postop Assessment: no signs of nausea or vomiting Anesthetic complications: no    Last Vitals:  Vitals:   11/12/16 1414 11/12/16 1555  BP: (!) 140/97   Pulse: 69   Resp: 18 (P) 16  Temp: 36.8 C (P) 36.4 C    Last Pain:  Vitals:   11/12/16 1555  TempSrc:   PainSc: (P) 0-No pain   Pain Goal: Patients Stated Pain Goal: (P) 3 (11/12/16 1555)               Adelbert Gaspard

## 2016-11-12 NOTE — Anesthesia Preprocedure Evaluation (Signed)
Anesthesia Evaluation  Patient identified by MRN, date of birth, ID band Patient awake    Reviewed: Allergy & Precautions, NPO status , Patient's Chart, lab work & pertinent test results  Airway Mallampati: II  TM Distance: >3 FB Neck ROM: Full    Dental no notable dental hx. (+) Teeth Intact   Pulmonary neg pulmonary ROS,    Pulmonary exam normal breath sounds clear to auscultation       Cardiovascular negative cardio ROS Normal cardiovascular exam Rhythm:Regular Rate:Normal     Neuro/Psych  Headaches, PSYCHIATRIC DISORDERS Depression    GI/Hepatic Neg liver ROS, GERD  ,  Endo/Other  Obesity  Renal/GU negative Renal ROS  negative genitourinary   Musculoskeletal negative musculoskeletal ROS (+)   Abdominal (+) + obese,   Peds  Hematology negative hematology ROS (+)   Anesthesia Other Findings   Reproductive/Obstetrics (+) Pregnancy Missed Ab- 9 weeks                             Anesthesia Physical Anesthesia Plan  ASA: II  Anesthesia Plan: MAC   Post-op Pain Management:    Induction: Intravenous  Airway Management Planned: Natural Airway, Simple Face Mask and Nasal Cannula  Additional Equipment:   Intra-op Plan:   Post-operative Plan:   Informed Consent: I have reviewed the patients History and Physical, chart, labs and discussed the procedure including the risks, benefits and alternatives for the proposed anesthesia with the patient or authorized representative who has indicated his/her understanding and acceptance.   Dental advisory given  Plan Discussed with: Anesthesiologist, CRNA and Surgeon  Anesthesia Plan Comments:         Anesthesia Quick Evaluation

## 2016-11-12 NOTE — Anesthesia Procedure Notes (Signed)
Procedure Name: LMA Insertion Date/Time: 11/12/2016 3:28 PM Performed by: Shanon PayorGREGORY, Erin Osborn Pre-anesthesia Checklist: Patient identified, Emergency Drugs available, Suction available and Patient being monitored Patient Re-evaluated:Patient Re-evaluated prior to inductionOxygen Delivery Method: Circle system utilized Preoxygenation: Pre-oxygenation with 100% oxygen Intubation Type: IV induction LMA: LMA inserted LMA Size: 4.0 Number of attempts: 1 Placement Confirmation: positive ETCO2 and breath sounds checked- equal and bilateral Tube secured with: Tape Dental Injury: Teeth and Oropharynx as per pre-operative assessment

## 2016-11-12 NOTE — H&P (Signed)
36 y.o. complains of US showing 9 week missed abortion.  Pt has had some moderate bleeding.   Past Medical History:  Diagnosis Date  . Migraines    otc med prn  . SVD (spontaneous vaginal delivery)    x 2   Past Surgical History:  Procedure Laterality Date  . CHOLECYSTECTOMY     Gallstones removed-Around 36 years of age  . DILATION AND CURETTAGE OF UTERUS N/A 10/18/2014   Procedure: SUCTION DILATATION AND CURETTAGE;  Surgeon: Adam PhenixJames G Arnold, MD;  Location: [facility];  Service: Gynecology;  Laterality: N/A;    Social History   Social History  . Marital status: Single    Spouse name: N/A  . Number of children: N/A  . Years of education: N/A   Occupational History  . Not on file.   Social History Main Topics  . Smoking status: Never Smoker  . Smokeless tobacco: Never Used  . Alcohol use No  . Drug use: No  . Sexual activity: Yes    Birth control/ protection: None   Other Topics Concern  . Not on file   Social History Narrative  . No narrative on file    No current facility-administered medications on file prior to encounter.    Current Outpatient Prescriptions on File Prior to Encounter  Medication Sig Dispense Refill  . ibuprofen (ADVIL,MOTRIN) 600 MG tablet Take 1 tablet (600 mg total) by mouth every 6 (six) hours as needed. (Patient taking differently: Take 600 mg by mouth every 6 (six) hours as needed for moderate pain. ) 30 tablet 0  . HYDROcodone-acetaminophen (NORCO/VICODIN) 5-325 MG tablet Take 1-2 tablets by mouth every 4 (four) hours as needed. (Patient taking differently: Take 1-2 tablets by mouth every 4 (four) hours as needed for severe pain. ) 6 tablet 0  . Prenatal Multivit-Min-Fe-FA (PRENATAL VITAMINS) 0.8 MG tablet Take 1 tablet by mouth daily. (Patient not taking: Reported on 11/10/2016) 30 tablet 12    No Known Allergies  @VITALS2 @  Lungs: clear to ascultation Cor:  RRR Abdomen:  soft, nontender, nondistended. Ex:  no cords, erythema Pelvic:   Deferred to OR  A: 9 week MAB   P: All risks, benefits and alternatives including expectant mgmt, medical mgmt and surgery d/w patient and she desires to proceed.   100mg  IV doxycycline pre-op Other routine care   AvocaALLAHAN, Luther ParodySIDNEY

## 2016-11-13 ENCOUNTER — Encounter (HOSPITAL_COMMUNITY): Payer: Self-pay | Admitting: Obstetrics and Gynecology

## 2016-11-22 ENCOUNTER — Ambulatory Visit (INDEPENDENT_AMBULATORY_CARE_PROVIDER_SITE_OTHER): Payer: Medicaid Other | Admitting: Student

## 2016-11-22 ENCOUNTER — Encounter: Payer: Self-pay | Admitting: Student

## 2016-11-22 VITALS — BP 120/84 | HR 72 | Temp 98.2°F | Ht 60.0 in | Wt 194.6 lb

## 2016-11-22 DIAGNOSIS — R6889 Other general symptoms and signs: Secondary | ICD-10-CM

## 2016-11-22 DIAGNOSIS — H10503 Unspecified blepharoconjunctivitis, bilateral: Secondary | ICD-10-CM | POA: Diagnosis not present

## 2016-11-22 DIAGNOSIS — Z8669 Personal history of other diseases of the nervous system and sense organs: Secondary | ICD-10-CM | POA: Diagnosis not present

## 2016-11-22 DIAGNOSIS — R21 Rash and other nonspecific skin eruption: Secondary | ICD-10-CM | POA: Diagnosis not present

## 2016-11-22 DIAGNOSIS — H578 Other specified disorders of eye and adnexa: Secondary | ICD-10-CM | POA: Diagnosis not present

## 2016-11-22 MED ORDER — FLUTICASONE PROPIONATE 50 MCG/ACT NA SUSP: 2.0000 | Freq: Every day | NASAL | 6 refills | Status: DC

## 2016-11-22 MED ORDER — OLOPATADINE HCL 0.2 % OP SOLN: 1.0000 [drp] | Freq: Every day | OPHTHALMIC | 1 refills | Status: DC

## 2016-11-22 NOTE — Patient Instructions (Signed)
It was great seeing you today! We have addressed the following issues today 1. Eye discharge/itching: I have sent a prescription for an eyedrop and Flonase nasal spray to your pharmacy. Please pick up this prescriptions and start using us we discussed in the office. I also suggest you look up for Medicaid accepting eye doctors in [place] and schedule an appointment with them. Please seek immediate care if you have severe pain, vision changes or other symptoms concerning to you.   2   For your skin rash: I suggest using gentle soaps such as Sensitive Skin Dove. I also recommend using emollients such as regular Vaseline after bath or washing your face. If no improvement with this, please follow-up with your primary care doctor.   If we did any lab work today, and the results require attention, either me or my nurse will get in touch with you. If everything is normal, you will get a letter in mail and a message via . If you don't hear from us in two weeks, please give us a call. Otherwise, we look forward to seeing you again at your next visit. If you have any questions or concerns before then, please call the clinic at [phone].  Please bring all your medications to every doctors visit  Sign up for My Chart to have easy access to your labs results, and communication with your Primary care physician.    Please check-out at the front desk before leaving the clinic.    Take Care,   Dr. Alanda SlimGonfa

## 2016-11-22 NOTE — Assessment & Plan Note (Signed)
Patient with widespread scaly skin rash and dry skin in her face including the eyelids. Likely atopic dermatitis. I also question about autoimmune disease given history of two miscarriages. However, patient has no constitutional symptoms. -Discussed conservative management including skin sensitive soap such as dove, and using Vaseline regularly after bath or washing her face. If no improvement with this, I recommended follow-up with PCP for further evaluation and possible referral to dermatologist.

## 2016-11-22 NOTE — Assessment & Plan Note (Signed)
History and exam suggestive for a allergic conjunctivitis. She has history of allergic rhinitis. I cannot exclude viral conjunctivitis either. No prulent discharge to think of bacterial conjunctivitis. No significant pain except gritty sensation in her eyes. No visual change.  -Gave prescription for Pataday eyedrop -Gave prescription for Flonase nasal spray and discussed about proper use -Recommended follow-up with an eye doctor. I advised patient to look up for eye doctors accepting Medicaid in the area and calling for appointment. -Discussed return precautions including severe pain, vision change or other symptoms concerning to her.

## 2016-11-22 NOTE — Op Note (Signed)
NAME:  Carolyne LittlesBYNUM, Ragen                  ACCOUNT NO.:  MEDICAL RECORD NO.:  [id number]  LOCATION:                                 FACILITY:  PHYSICIAN:  Philip AspenSidney Alayne Estrella, DO    DATE OF BIRTH:  1980-07-17  DATE OF PROCEDURE:  11/12/2016 DATE OF DISCHARGE:                              OPERATIVE REPORT   PREOPERATIVE DIAGNOSIS:  Nine-week missed abortion.  POSTOPERATIVE DIAGNOSIS:  Nine-week missed abortion.  PROCEDURE:  Dilation and evacuation.  SURGEON:  Philip AspenSidney Tanja Gift, DO.  ANESTHESIA:  LMA.  ESTIMATED BLOOD LOSS:  Minimal.  URINE OUTPUT:  15 mL.  IV FLUIDS:  Please see Anesthesia.  LOCAL MEDICATIONS:  Xylocaine for paracervical block.  SPECIMENS:  Products of conception to Pathology.  FINDINGS:  An approximately 9-week size uterus.  Moderate products of conception.  Normal appearing vulva, vagina, and cervix.  DESCRIPTION OF PROCEDURE:  The patient was taken to the operating room where anesthesia was administered and found to be adequate.  She was prepped and draped in the normal sterile fashion in dorsal lithotomy position.  A Foley catheter emptied the bladder, and a speculum was placed in the anterior lip of the uterus, was grasped with a single- tooth tenaculum.  The cervix was serially dilated to accommodate a curved 10 vacuum curette.  The vacuum curette was advanced gently to the fundus, suction applied, and 3 passes with moderate tissue removed. Gentle curettage was then performed of all 4 quadrants with minimal return of any additional tissue.  One final pass of the curved backing curette performed.  All instruments were removed.  Excellent hemostasis noted. No further bleeding identified.  The patient tolerated the procedure well.  Sponge, lap, and needle counts were correct x2.  The patient was taken to Recovery in stable condition.          ______________________________ Philip AspenSidney Emerlyn Mehlhoff, DO     [place]/[facility]  D:  11/22/2016  T:  11/22/2016  Job:   [id number]

## 2016-11-22 NOTE — Progress Notes (Signed)
Subjective:    Erin Osborn is a 36 y.o. old female here eye pain and discharge  HPI Eye pain and discharge: this has been going on for 6 months intermittently but worse for the last 2 days. She describes the pain as sharp and gritty, mainly in the right eye. She woke up this morning and her eyelids were matted. The eyes are itchy. She reports history of seasonal allergy. In the past, she was given some eyedrops and her symptoms went away.  She denies vision change, fever, chills, headache and photophobia. Denies new cosmetics or wearing eyelash.   She had D&C for missed abortion about a week ago. She was started on doxycycline after that.   She also have a skin rash on her face. She reports using hydrocodone cream for this.   PMH/Problem List: has OBESITY; DEPRESSIVE DISORDER, NOS; Hemorrhoids; ALLERGIC RHINITIS; GERD; Chronic headaches; Blepharoconjunctivitis; Sinusitis; Rash and nonspecific skin eruption; Elevated glucose tolerance test; and Allergic conjunctivitis on her problem list.   has a past medical history of Migraines and SVD (spontaneous vaginal delivery).  FH:  Family History  Problem Relation Age of Onset  . Hypertension Mother   . Diabetes Mother   . Hypertension Maternal Grandmother   . Diabetes Maternal Grandmother   . Alcohol abuse Father   . Drug abuse Father   . Hypertension Sister   . Drug abuse Brother   . Alcohol abuse Brother   . Acute myelogenous leukemia Neg Hx     SH Social History  Substance Use Topics  . Smoking status: Never Smoker  . Smokeless tobacco: Never Used  . Alcohol use No    Review of Systems Review of systems negative except for pertinent positives and negatives in history of present illness above.     Objective:     Vitals:   11/22/16 1039  BP: 120/84  Pulse: 72  Temp: 98.2 F (36.8 C)  TempSrc: Oral  SpO2: 97%  Weight: 194 lb 9.6 oz (88.3 kg)  Height: 5' (1.524 m)    Physical Exam GEN: appears well, no apparent  distress. Head: normocephalic and atraumatic  Eyes: Symmetric, no tearing or significant discharge, PERRL, normal direct and consensual pupillary reflex, no strabismus, EOMI, no proptosis, no eyelid swelling but scaly skin rash all over her face including the eyelids, palberbral conjunctival injection (right greater than left), no bulbar conjunctivitis, no corneal clouding.   Nares: Mild rhinorrhea with erythema Oropharynx: mmm without erythema or exudation HEM: negative for cervical or periauricular lymphadenopathies CVS: RRR, nl S1&S2, no murmurs, no edema RESP:no IWOB, good air movement bilaterally, CTAB SKIN: widespread scaly skin rash on her face including the eyelids as above. See picture for more.   NEURO: alert and oiented appropriately, no gross defecits  PSYCH: euthymic mood with congruent affect     Assessment and Plan:  Blepharoconjunctivitis History and exam suggestive for a allergic conjunctivitis. She has history of allergic rhinitis. I cannot exclude viral conjunctivitis either. No prulent discharge to think of bacterial conjunctivitis. No significant pain except gritty sensation in her eyes. No visual change.  -Gave prescription for Pataday eyedrop -Gave prescription for Flonase nasal spray and discussed about proper use -Recommended follow-up with an eye doctor. I advised patient to look up for eye doctors accepting Medicaid in the area and calling for appointment. -Discussed return precautions including severe pain, vision change or other symptoms concerning to her.   Rash and nonspecific skin eruption Patient with widespread scaly skin rash and dry skin  in her face including the eyelids. Likely atopic dermatitis. I also question about autoimmune disease given history of two miscarriages. However, patient has no constitutional symptoms. -Discussed conservative management including skin sensitive soap such as dove, and using Vaseline regularly after bath or washing her  face. If no improvement with this, I recommended follow-up with PCP for further evaluation and possible referral to dermatologist.   Almon Hercules, MD 11/22/16 Pager: [phone]

## 2016-11-27 IMAGING — US US OB COMP LESS 14 WK
1 series · 15 of 28 positions shown · non-contrast
Comparison: None.

CLINICAL DATA: Check fetal viability, uncertain dating

EXAM:
OBSTETRIC <14 WK US AND TRANSVAGINAL OB US
TECHNIQUE: Both transabdominal and transvaginal ultrasound examinations were
performed for complete evaluation of the gestation as well as the
maternal uterus, adnexal regions, and pelvic cul-de-sac.
Transvaginal technique was performed to assess early pregnancy.

[Series 1: us ob comp less 14 wk · 15 of 61 slices shown]
[im 1/61]
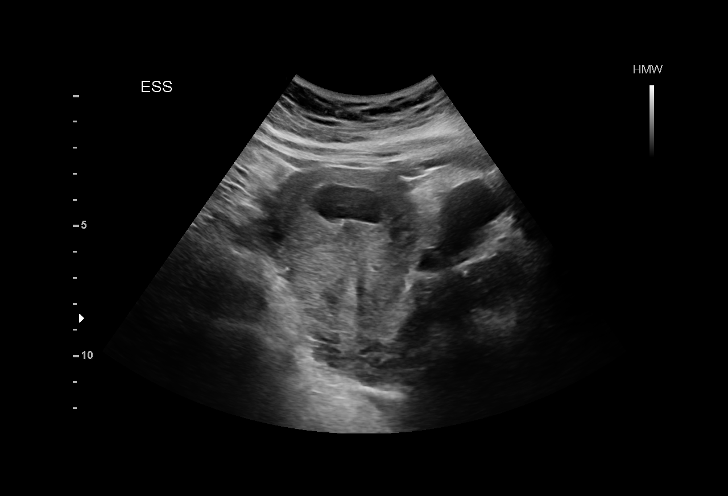
[im 5/61]
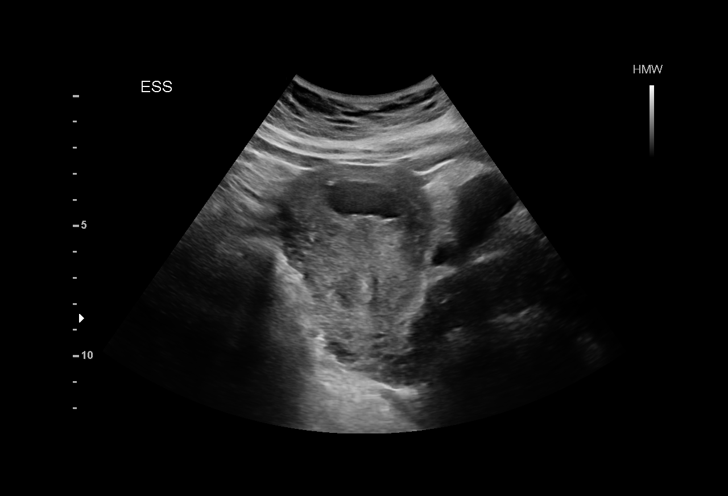
[im 9/61]
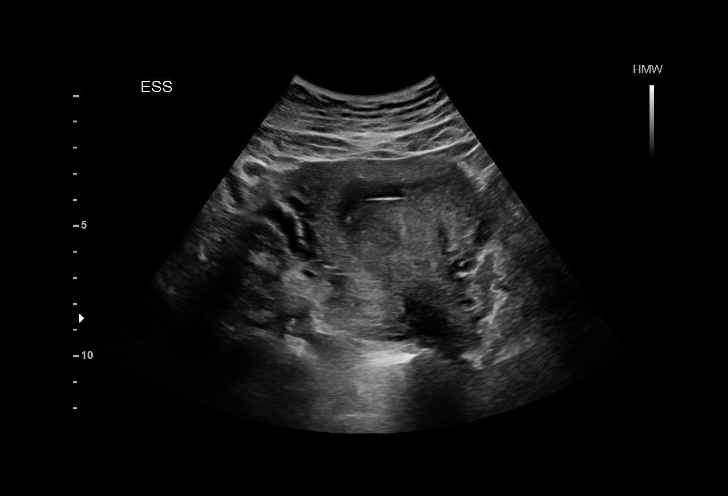
[im 14/61]
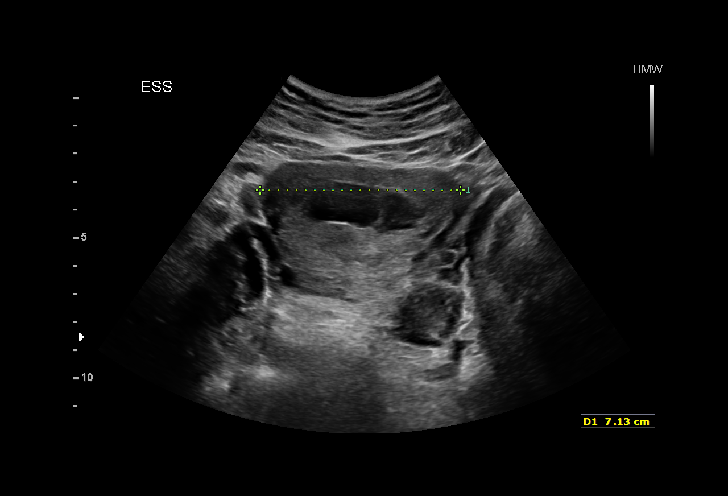
[im 18/61]
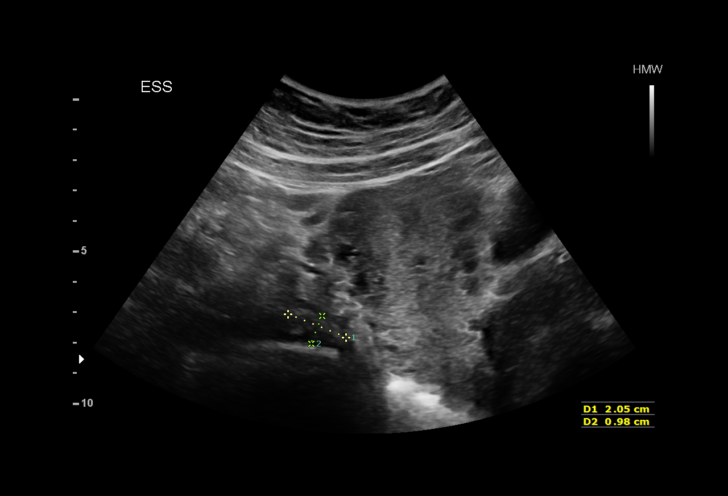
[im 23/61]
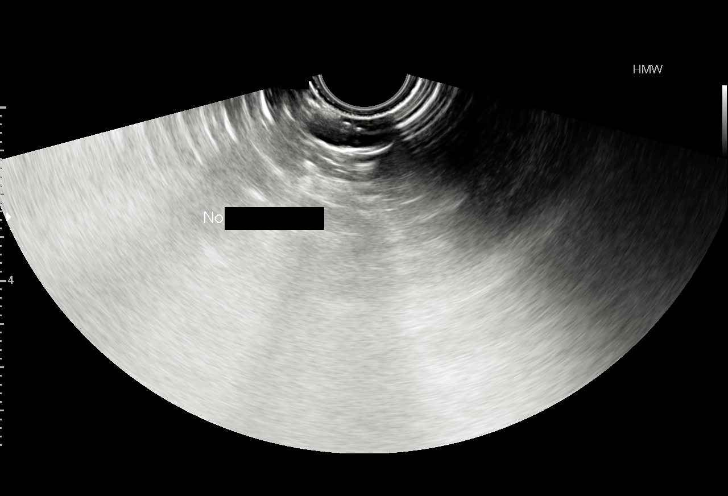
[im 27/61]
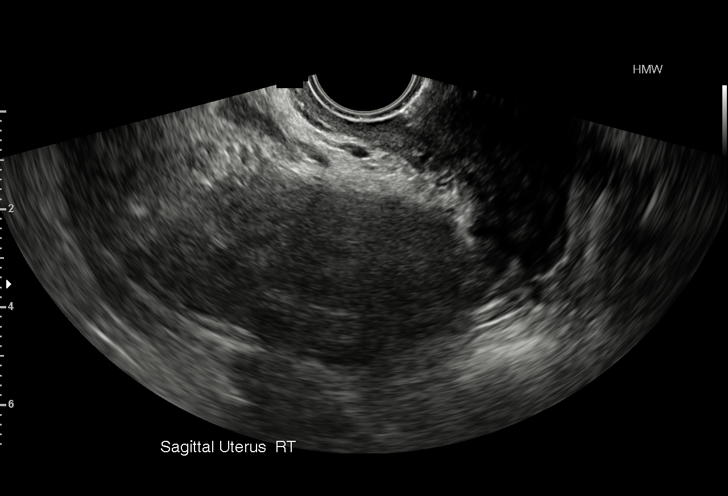
[im 32/61]
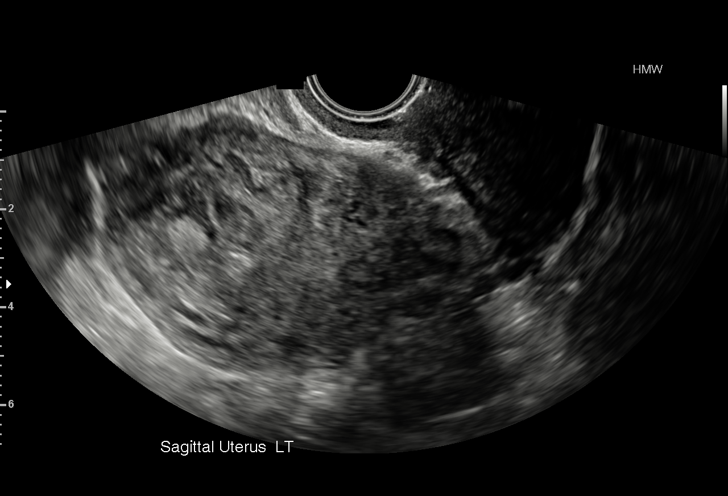
[im 34/61]
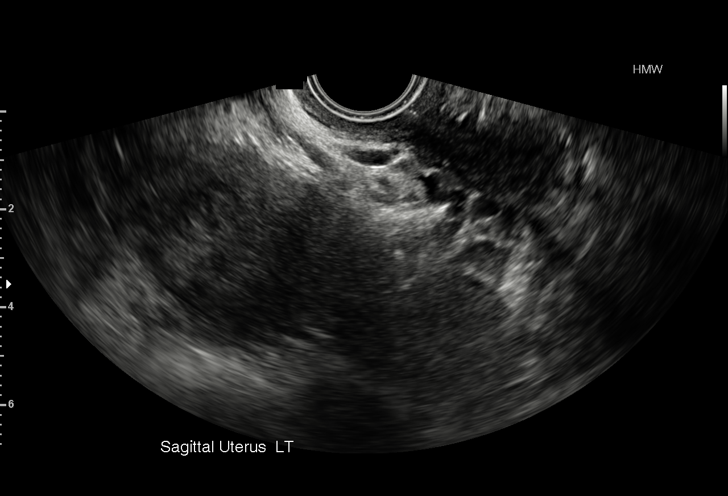
[im 38/61]
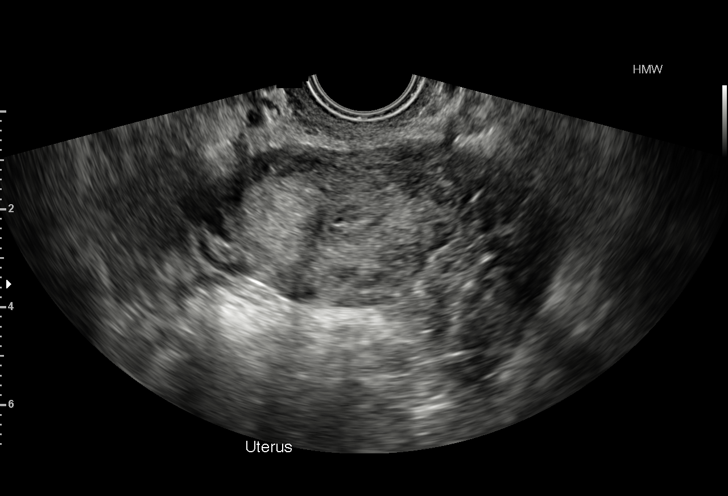
[im 43/61]
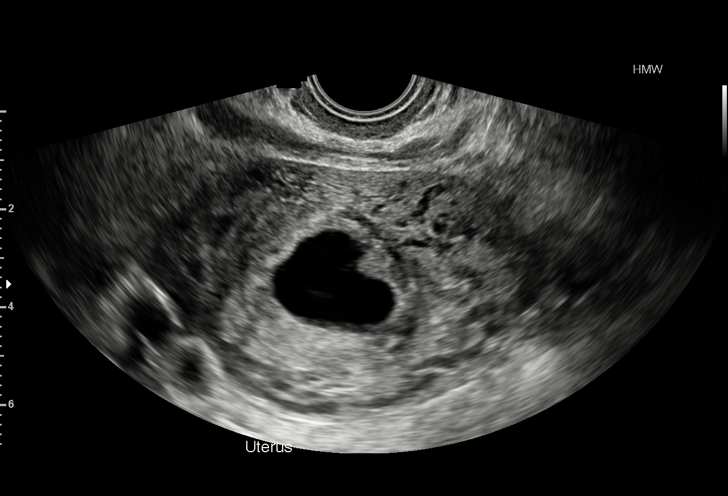
[im 47/61]
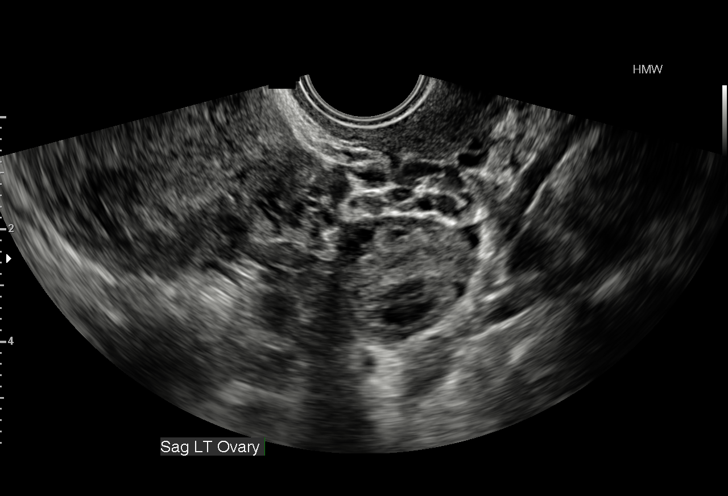
[im 52/61]
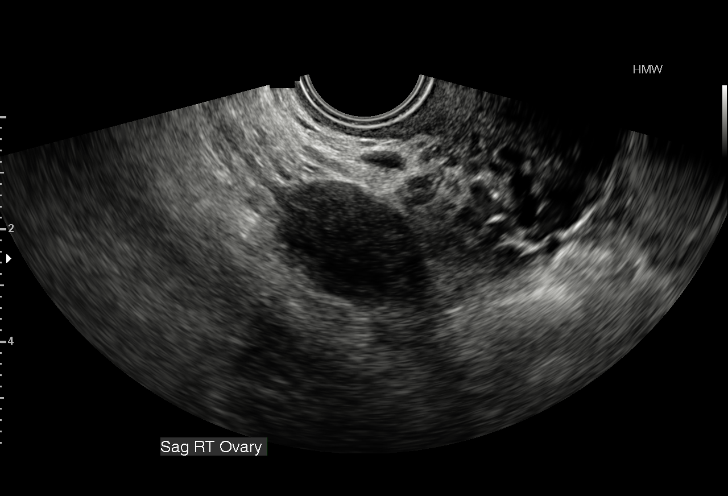
[im 56/61]
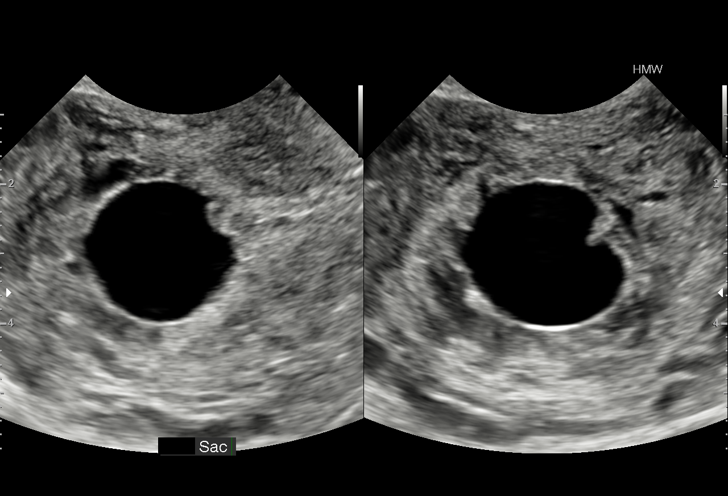
[im 61/61]
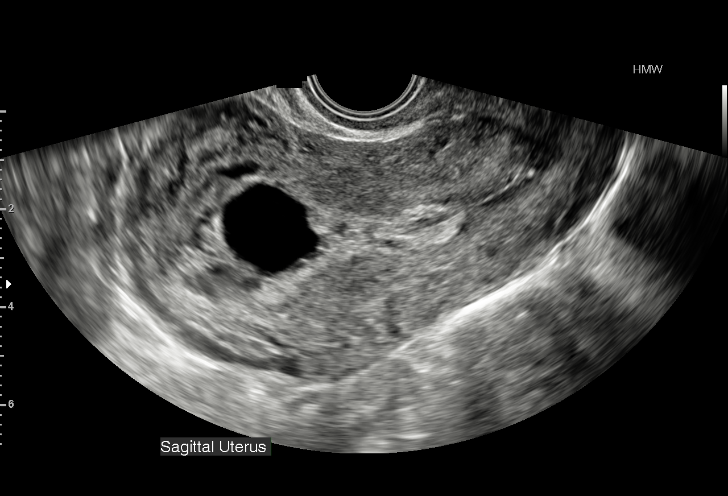

[15 of 28 positions shown; findings below may reference images not displayed]

FINDINGS: Intrauterine gestational sac: Visualized/normal in shape.

Yolk sac:  Not visualized

Embryo:  Not visualized

MSD: 21  mm   7 w   0  d

Maternal uterus/adnexae: Moderate subchorionic hemorrhage is
identified. The ovaries are within normal limits.
IMPRESSION: Gestational sac is present with evidence of subchorionic hemorrhage.
No definitive embryo is identified. Findings are suspicious but not
yet definitive for failed pregnancy. Recommend follow-up US in 10-14
days for definitive diagnosis. This recommendation follows SRU
consensus guidelines: Diagnostic Criteria for Nonviable Pregnancy
Early in the First Trimester. N Engl J Med 9910; [DATE].

## 2017-02-15 ENCOUNTER — Encounter (HOSPITAL_COMMUNITY): Payer: Self-pay | Admitting: Emergency Medicine

## 2017-02-15 ENCOUNTER — Ambulatory Visit (HOSPITAL_COMMUNITY)
Admission: EM | Admit: 2017-02-15 | Discharge: 2017-02-15 | Disposition: A | Discharge status: Home / Self Care | Payer: Medicaid Other | Attending: Family Medicine | Admitting: Family Medicine

## 2017-02-15 DIAGNOSIS — T63441A Toxic effect of venom of bees, accidental (unintentional), initial encounter: Secondary | ICD-10-CM | POA: Diagnosis not present

## 2017-02-15 MED ORDER — MUPIROCIN 2 % EX OINT: 1.0000 "application " | TOPICAL_OINTMENT | Freq: Two times a day (BID) | CUTANEOUS | 0 refills | Status: DC

## 2017-02-15 MED ORDER — IBUPROFEN 600 MG PO TABS: 600.0000 mg | ORAL_TABLET | Freq: Three times a day (TID) | ORAL | 0 refills | Status: DC

## 2017-02-15 MED ORDER — LORATADINE 10 MG PO TABS: 10.0000 mg | ORAL_TABLET | Freq: Every day | ORAL | 0 refills | Status: DC | PRN

## 2017-02-15 NOTE — ED Triage Notes (Signed)
PT was stung by a bee or wasp to left lower leg yesterday. Area is red, warm, and swollen.

## 2017-02-15 NOTE — ED Provider Notes (Signed)
[facility]    CSN: [id number] Arrival date & time: 02/15/17  1718     History   Chief Complaint Chief Complaint  Patient presents with  . Insect Bite    HPI Erin Osborn is a 36 y.o. female.   36 year old female comes in for 1 day history of bee sting on her left lower leg. Denies swelling of the throat, trouble breathing, trouble swallowing. States area has been itching, swelling and having pain. She has not taken anything for the itchiness, has not taken anything for the pain. Denies fever, chills, night sweats. Denies spreading redness, increased warmth.      Past Medical History:  Diagnosis Date  . Migraines    otc med prn  . SVD (spontaneous vaginal delivery)    x 2    Patient Active Problem List   Diagnosis Date Noted  . Allergic conjunctivitis 12/12/2015  . Elevated glucose tolerance test 04/28/2015  . Rash and nonspecific skin eruption 10/30/2012  . Sinusitis 12/22/2011  . Blepharoconjunctivitis 03/30/2011  . Chronic headaches 10/08/2010  . ALLERGIC RHINITIS 10/31/2008  . OBESITY 08/02/2008  . GERD 06/21/2008  . DEPRESSIVE DISORDER, NOS 08/11/2006  . Hemorrhoids 08/11/2006    Past Surgical History:  Procedure Laterality Date  . CHOLECYSTECTOMY     Gallstones removed-Around 36 years of age  . DILATION AND CURETTAGE OF UTERUS N/A 10/18/2014   Procedure: SUCTION DILATATION AND CURETTAGE;  Surgeon: Adam Phenix, MD;  Location: [facility];  Service: Gynecology;  Laterality: N/A;  . DILATION AND EVACUATION N/A 11/12/2016   Procedure: DILATATION AND EVACUATION;  Surgeon: Philip Aspen, DO;  Location: [facility];  Service: Gynecology;  Laterality: N/A;    OB History    This patient's OB History needs to be verified. Open OB History to review and resolve any issues.   Gravida Para Term Preterm AB Living   5 2 2   2 2    SAB TAB Ectopic Multiple Live Births   2     0 2       Home Medications    Prior to Admission medications   Medication  Sig Start Date End Date Taking? Authorizing Provider  doxycycline (VIBRA-TABS) 100 MG tablet Take tab 12 hours after procedure 11/12/16   Philip Aspen, DO  fluticasone [facility]) 50 MCG/ACT nasal spray Place 2 sprays into both nostrils daily. 11/22/16   Almon Hercules, MD  hydrocortisone cream 1 % Apply 1 application topically 2 (two) times daily as needed (eczema).    [provider]  ibuprofen (ADVIL,MOTRIN) 600 MG tablet Take 1 tablet (600 mg total) by mouth 3 (three) times daily. 02/15/17   Cathie Hoops, Loys Shugars V, PA-C  loratadine (CLARITIN) 10 MG tablet Take 1 tablet (10 mg total) by mouth daily as needed for allergies. 02/15/17   Cathie Hoops, Shawnn Bouillon V, PA-C  medroxyPROGESTERone (DEPO-PROVERA) 150 MG/ML injection Present to office next week with syringe for injection 11/12/16   Philip Aspen, DO  mupirocin ointment (BACTROBAN) 2 % Apply 1 application topically 2 (two) times daily. 02/15/17   Cathie Hoops, Jonnatan Hanners V, PA-C  Olopatadine HCl (PATADAY) 0.2 % SOLN Apply 1 drop to eye daily. 11/22/16   Almon Hercules, MD  oxyCODONE-acetaminophen (PERCOCET/ROXICET) 5-325 MG tablet Take 1 tablet by mouth every 6 (six) hours as needed for severe pain. 11/12/16   Philip Aspen, DO    Family History Family History  Problem Relation Age of Onset  . Hypertension Mother   . Diabetes Mother   .  Hypertension Maternal Grandmother   . Diabetes Maternal Grandmother   . Alcohol abuse Father   . Drug abuse Father   . Hypertension Sister   . Drug abuse Brother   . Alcohol abuse Brother   . Acute myelogenous leukemia Neg Hx     Social History Social History  Substance Use Topics  . Smoking status: Never Smoker  . Smokeless tobacco: Never Used  . Alcohol use No     Allergies   Patient has no known allergies.   Review of Systems Review of Systems  Reason unable to perform ROS: See HPI as above.     Physical Exam Triage Vital Signs ED Triage Vitals [02/15/17 1809]  Enc Vitals Group     BP 128/80     Pulse Rate 72     Resp  16     Temp 98.7 F (37.1 C)     Temp src      SpO2 100 %     Weight 160 lb (72.6 kg)     Height 5' (1.524 m)     Head Circumference      Peak Flow      Pain Score 6     Pain Loc      Pain Edu?      Excl. in GC?    No data found.   Updated Vital Signs BP 128/80   Pulse 72   Temp 98.7 F (37.1 C)   Resp 16   Ht 5' (1.524 m)   Wt 160 lb (72.6 kg)   LMP 08/01/2016   SpO2 100%   BMI 31.25 kg/m   Physical Exam  Constitutional: She is oriented to person, place, and time. She appears well-developed and well-nourished. No distress.  HENT:  Head: Normocephalic and atraumatic.  Eyes: Pupils are equal, round, and reactive to light. Conjunctivae are normal.  Neurological: She is alert and oriented to person, place, and time.  Skin:  Small wound at bee sting site. Surrounding erythema around 4-5cm, increased warmth. No tenderness on palpation. No drainage, induration felt.     UC Treatments / Results  Labs (all labs ordered are listed, but only abnormal results are displayed) Labs Reviewed - No data to display  EKG  EKG Interpretation None       Radiology No results found.  Procedures Procedures (including critical care time)  Medications Ordered in UC Medications - No data to display   Initial Impression / Assessment and Plan / UC Course  I have reviewed the triage vital signs and the nursing notes.  Pertinent labs & imaging results that were available during my care of the patient were reviewed by me and considered in my medical decision making (see chart for details).     Discussed with patient surrounding erythema and increased warmth could be due to inflammation/local reaction vs cellulitis. Given patient has been scratching due to itchiness, will treat for local reaction and have patient monitor for worsening symptoms. Start Claritin and ice compress for itchiness, ibuprofen for pain. Bactroban on affected area. Return precautions given. Patient expresses  understanding and agrees to plan.   Final Clinical Impressions(s) / UC Diagnoses   Final diagnoses:  Bee sting, accidental or unintentional, initial encounter    New Prescriptions New Prescriptions   MUPIROCIN OINTMENT (BACTROBAN) 2 %    Apply 1 application topically 2 (two) times daily.       Belinda FisherYu, Travian Kerner V, PA-C 02/15/17 [phone]

## 2017-02-15 NOTE — Discharge Instructions (Signed)
Start ibuprofen as directed for pain. Claritin and ice compress for itchiness. Bactroban on affected area to prevent infection.  monitor for worsening of symptoms, fever, spreading redness, increased warmth, increased pain, follow up here or with PCP for further evaluation.

## 2017-04-19 ENCOUNTER — Ambulatory Visit: Payer: Medicaid Other | Admitting: Internal Medicine

## 2017-04-19 ENCOUNTER — Encounter (HOSPITAL_COMMUNITY): Payer: Self-pay | Admitting: Emergency Medicine

## 2017-04-19 ENCOUNTER — Ambulatory Visit (HOSPITAL_COMMUNITY)
Admission: EM | Admit: 2017-04-19 | Discharge: 2017-04-19 | Disposition: A | Discharge status: Home / Self Care | Payer: Medicaid Other | Attending: Internal Medicine | Admitting: Internal Medicine

## 2017-04-19 DIAGNOSIS — K644 Residual hemorrhoidal skin tags: Secondary | ICD-10-CM

## 2017-04-19 MED ORDER — LIDOCAINE (ANORECTAL) 5 % EX CREA: TOPICAL_CREAM | CUTANEOUS | 0 refills | Status: DC

## 2017-04-19 MED ORDER — HYDROCORTISONE 2.5 % RE CREA: 1.0000 "application " | TOPICAL_CREAM | Freq: Two times a day (BID) | RECTAL | 0 refills | Status: DC

## 2017-04-19 MED ORDER — POLYETHYLENE GLYCOL 3350 17 G PO PACK: 17.0000 g | PACK | Freq: Every day | ORAL | 0 refills | Status: DC

## 2017-04-19 NOTE — ED Triage Notes (Signed)
Pt here for hemorrhoids with pain and bleeding

## 2017-04-19 NOTE — Discharge Instructions (Signed)
Start miralax to soften your stool. Hydrocortisone and lidocaine cream as directed. Sitz bath as needed. Can use witch hazel wipes to further help with symptoms. Follow up with PCP for further management needed. Can also follow up with general surgery to discuss further treatment options.

## 2017-04-19 NOTE — ED Provider Notes (Signed)
[facility]    CSN: [id number] Arrival date & time: 04/19/17  0957     History   Chief Complaint Chief Complaint  Patient presents with  . Hemorrhoids    HPI Atiyana R Solon Osborn is a 36 y.o. female.   36 year old female with history of hemorrhoids comes in for 4-5 day history of worsening rectal pain with rectal bleeding. States she has been using Dibucaine without relief. She continues to do sitz bath as well. She noticed some blood when wiping after bowel movements. States bowel movements are regular, once daily without straining. [facility] asked about bowel movements. Denies abdominal pain, nausea, vomiting. Denies fever, chill, night sweats. Denies weakness, dizziness.       Past Medical History:  Diagnosis Date  . Migraines    otc med prn  . SVD (spontaneous vaginal delivery)    x 2    Patient Active Problem List   Diagnosis Date Noted  . Allergic conjunctivitis 12/12/2015  . Elevated glucose tolerance test 04/28/2015  . Rash and nonspecific skin eruption 10/30/2012  . Sinusitis 12/22/2011  . Blepharoconjunctivitis 03/30/2011  . Chronic headaches 10/08/2010  . ALLERGIC RHINITIS 10/31/2008  . OBESITY 08/02/2008  . GERD 06/21/2008  . DEPRESSIVE DISORDER, NOS 08/11/2006  . Hemorrhoids 08/11/2006    Past Surgical History:  Procedure Laterality Date  . CHOLECYSTECTOMY     Gallstones removed-Around 36 years of age    OB History    This patient's OB History needs to be verified. Open OB History to review and resolve any issues.   Gravida Para Term Preterm AB Living   5 2 2   2 2    SAB TAB Ectopic Multiple Live Births   2     0 2       Home Medications    Prior to Admission medications   Medication Sig Start Date End Date Taking? Authorizing Provider  doxycycline (VIBRA-TABS) 100 MG tablet Take tab 12 hours after procedure 11/12/16   Philip Aspenallahan, Sidney, DO  fluticasone [facility](FLONASE) 50 MCG/ACT nasal spray Place 2 sprays  into both nostrils daily. 11/22/16   Almon HerculesGonfa, Taye T, MD  hydrocortisone (ANUSOL-HC) 2.5 % rectal cream Place 1 application 2 (two) times daily rectally. 04/19/17   Cathie HoopsYu, Macon Lesesne V, PA-C  hydrocortisone cream 1 % Apply 1 application topically 2 (two) times daily as needed (eczema).    [provider]  ibuprofen (ADVIL,MOTRIN) 600 MG tablet Take 1 tablet (600 mg total) by mouth 3 (three) times daily. 02/15/17   Cathie HoopsYu, Janellie Tennison V, PA-C  Lidocaine, Anorectal, 5 % CREA Apply to affected area <6 times daily (morning, evening, and after each bowel movement) 04/19/17   Cathie HoopsYu, Jahmiyah Dullea V, PA-C  loratadine (CLARITIN) 10 MG tablet Take 1 tablet (10 mg total) by mouth daily as needed for allergies. 02/15/17   Cathie HoopsYu, Idalis Hoelting V, PA-C  medroxyPROGESTERone (DEPO-PROVERA) 150 MG/ML injection Present to office next week with syringe for injection 11/12/16   Philip Aspenallahan, Sidney, DO  mupirocin ointment (BACTROBAN) 2 % Apply 1 application topically 2 (two) times daily. 02/15/17   Cathie HoopsYu, Mahkayla Preece V, PA-C  Olopatadine HCl (PATADAY) 0.2 % SOLN Apply 1 drop to eye daily. 11/22/16   Almon HerculesGonfa, Taye T, MD  oxyCODONE-acetaminophen (PERCOCET/ROXICET) 5-325 MG tablet Take 1 tablet by mouth every 6 (six) hours as needed for severe pain. 11/12/16   Philip Aspenallahan, Sidney, DO  polyethylene glycol [facility](MIRALAX) packet Take 17 g daily by mouth. 04/19/17   Cathie HoopsYu,  Leiloni Smithers V, PA-C    Family History Family History  Problem Relation Age of Onset  . Hypertension Mother   . Diabetes Mother   . Hypertension Maternal Grandmother   . Diabetes Maternal Grandmother   . Alcohol abuse Father   . Drug abuse Father   . Hypertension Sister   . Drug abuse Brother   . Alcohol abuse Brother   . Acute myelogenous leukemia Neg Hx     Social History Social History   Tobacco Use  . Smoking status: Never Smoker  . Smokeless tobacco: Never Used  Substance Use Topics  . Alcohol use: No  . Drug use: No     Allergies   Patient has no known allergies.   Review of Systems Review of Systems  Reason  unable to perform ROS: See HPI as above.     Physical Exam Triage Vital Signs ED Triage Vitals  Enc Vitals Group     BP 04/19/17 1008 (!) 152/99     Pulse Rate 04/19/17 1008 77     Resp 04/19/17 1008 18     Temp 04/19/17 1008 98 F (36.7 C)     Temp Source 04/19/17 1008 Oral     SpO2 04/19/17 1008 100 %     Weight --      Height --      Head Circumference --      Peak Flow --      Pain Score 04/19/17 1009 8     Pain Loc --      Pain Edu? --      Excl. in GC? --    No data found.  Updated Vital Signs BP (!) 152/99 (BP Location: Right Arm)   Pulse 77   Temp 98 F (36.7 C) (Oral)   Resp 18   LMP 08/01/2016   SpO2 100%   Physical Exam  Constitutional: She is oriented to person, place, and time. She appears well-developed and well-nourished. No distress.  HENT:  Head: Normocephalic and atraumatic.  Eyes: Conjunctivae are normal. Pupils are equal, round, and reactive to light.  Genitourinary: Rectal exam shows external hemorrhoid and tenderness.  Genitourinary Comments: 3 external hemorrhoids, nonthrombosed. Mild tenderness to palpation. No bleeding seen. Rectal exam not performed due to patient's discomfort.   Neurological: She is alert and oriented to person, place, and time.     UC Treatments / Results  Labs (all labs ordered are listed, but only abnormal results are displayed) Labs Reviewed - No data to display  EKG  EKG Interpretation None       Radiology No results found.  Procedures Procedures (including critical care time)  Medications Ordered in UC Medications - No data to display   Initial Impression / Assessment and Plan / UC Course  I have reviewed the triage vital signs and the nursing notes.  Pertinent labs & imaging results that were available during my care of the patient were reviewed by me and considered in my medical decision making (see chart for details).    Proctosol HC and recticare as directed. Other symptomatic treatment  discussed. Patient to follow up with PCP for further management needed. Information to general surgery provided for further treatment options as needed. Return precautions given.   Final Clinical Impressions(s) / UC Diagnoses   Final diagnoses:  External hemorrhoids    ED Discharge Orders        Ordered    Lidocaine, Anorectal, 5 % CREA     04/19/17 1030  hydrocortisone (ANUSOL-HC) 2.5 % rectal cream  2 times daily     04/19/17 1030    polyethylene glycol (MIRALAX) packet  Daily     04/19/17 1030        [address] [place] 04/19/17 1043

## 2017-04-25 NOTE — Progress Notes (Signed)
   [facility] Phone: [phone]   Date of Visit: 04/26/2017   HPI:  Cold-like symptoms: -Patient reports of one-week history of dry cough, rhinorrhea, nasal congestion.  She also has a frontal headache. -Denies any fevers, shortness of breath, chest pain, chills.  Denies any neck pain, photophobia, nausea, vomiting.  Denies any sinus pressure -Denies any ear pain she reports she initially had a mild sore throat but this improved with.  Taking Robitussin. -No sick contacts.  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 -Symptoms have been stable and not worsening. -Denies history of smoking. -Reports of normal oral intake.  ROS: See HPI.  PMFSH:  PMH: Allergic Rhinitis GERD Obesity Depressive Distorder  PHYSICAL EXAM: BP 122/82   Pulse 75   Temp 99.1 F (37.3 C) (Oral)   Wt 195 lb (88.5 kg)   LMP 08/01/2016   SpO2 99%   BMI 38.08 kg/m  GEN: NAD, non-toxic appearing, pleasant  HEENT: Atraumatic, normocephalic, neck supple without lymphadenopathy, EOMI, PERRL, mildly erythematous conjunctiva bilaterally without drainage noted, right TM with slight clear effusion otherwise normal. Unable to visualize left TM due to cerumen impaction. Posterior oropharynx with mild erythema but no exudates. Some tenderness to percussion of the Left > Right maxillary sinuses and frontal sinuses. No nuchal rigidity. CV: RRR, no murmurs, rubs, or gallops PULM: CTAB, normal effort, no wheezing or crackles.  SKIN: No rash or cyanosis; warm and well-perfused EXTR: No lower extremity edema or calf tenderness NEURO: Awake, alert, no focal deficits grossly, normal speech   ASSESSMENT/PLAN:  Viral URI with Cough/Acute Viral Sinusitis:  Symptoms are consistent with a viral etiology.  She is afebrile and her lung sounds are clear.  However since she has been having symptoms for 7 days I would like to get a chest x-ray to evaluate for pneumonia although this is less likely.   Discussed symptomatic management.  Provided Atrovent nasal spray and as needed Tessalon.  Discussed return precautions.  If her symptoms will worsen over the next few days she might need antibiotic for possible bacterial sinus infection.  Palma HolterKanishka G Kyleena Scheirer, MD PGY 3 [facility]

## 2017-04-26 ENCOUNTER — Other Ambulatory Visit: Payer: Self-pay

## 2017-04-26 ENCOUNTER — Ambulatory Visit: Payer: Medicaid Other | Admitting: Internal Medicine

## 2017-04-26 ENCOUNTER — Encounter: Payer: Self-pay | Admitting: Internal Medicine

## 2017-04-26 VITALS — BP 122/82 | HR 75 | Temp 99.1°F | Wt 195.0 lb

## 2017-04-26 DIAGNOSIS — J069 Acute upper respiratory infection, unspecified: Secondary | ICD-10-CM

## 2017-04-26 DIAGNOSIS — J019 Acute sinusitis, unspecified: Secondary | ICD-10-CM | POA: Diagnosis not present

## 2017-04-26 DIAGNOSIS — Z23 Encounter for immunization: Secondary | ICD-10-CM

## 2017-04-26 DIAGNOSIS — B9789 Other viral agents as the cause of diseases classified elsewhere: Secondary | ICD-10-CM | POA: Diagnosis not present

## 2017-04-26 DIAGNOSIS — R05 Cough: Secondary | ICD-10-CM | POA: Diagnosis present

## 2017-04-26 DIAGNOSIS — R059 Cough, unspecified: Secondary | ICD-10-CM

## 2017-04-26 MED ORDER — IPRATROPIUM BROMIDE 0.06 % NA SOLN: 2.0000 | Freq: Four times a day (QID) | NASAL | 0 refills | Status: DC

## 2017-04-26 MED ORDER — BENZONATATE 200 MG PO CAPS: 200.0000 mg | ORAL_CAPSULE | Freq: Three times a day (TID) | ORAL | 0 refills | Status: DC | PRN

## 2017-04-26 NOTE — Patient Instructions (Signed)
I think you have a viral infection. But please let me know if your sinus pain gets worse in the next few days as you may then need an antibiotic.   We will get a chest xray. Go to the hospital main entrance to get this done  I have prescribed Atrovent nasal spray  Use Tessalon for cough.  Drink tea with honey and use throat lozenges

## 2017-06-03 ENCOUNTER — Ambulatory Visit: Payer: Medicaid Other | Admitting: Internal Medicine

## 2017-06-03 ENCOUNTER — Other Ambulatory Visit: Payer: Self-pay

## 2017-06-03 ENCOUNTER — Encounter: Payer: Self-pay | Admitting: Internal Medicine

## 2017-06-03 DIAGNOSIS — R0789 Other chest pain: Secondary | ICD-10-CM | POA: Diagnosis present

## 2017-06-03 NOTE — Assessment & Plan Note (Signed)
Chest wall pain most consistent with MSK etiology, as reproducible on exam, improved with ibuprofen, non-radiating, and occurring following lifting heavy object. Less concern for cardiac etiology given chronicity of pain, nature of pain, reproducibility, and normal cardiac exam. Also less likely pulmonary etiology as patient denying SOB and respiratory exam NL. Ibuprofen PRN. Can use heating pad or ice pack as well. Refrain from lifting heavy objects for next few days.

## 2017-06-03 NOTE — Progress Notes (Signed)
   Subjective:   Patient: Erin Osborn       Birthdate: 26-Aug-1980       MRN: [id number]      HPI  Erin Osborn is a 36 y.o. female presenting for same day appt for chest wall pain.   Chest wall pain Began 3d ago. Located above L breast. 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 Took ibuprofen yesterday which helped. Denies difficulty breathing, palpitations, radiating pain. Denies redness or bruising at the site. No history of cardiac or respiratory issues.   Smoking status reviewed. Patient is never smoker.   Review of Systems See HPI.     Objective:  Physical Exam  Constitutional: She is oriented to person, place, and time and well-developed, well-nourished, and in no distress.  HENT:  Head: Normocephalic and atraumatic.  Cardiovascular: Normal rate, regular rhythm and normal heart sounds.  No murmur heard. Pulmonary/Chest: Effort normal and breath sounds normal. No respiratory distress. She has no wheezes.  Musculoskeletal:  TTP of chest wall above L breast  Neurological: She is alert and oriented to person, place, and time.  Skin:  No erythema or ecchymosis above L breast  Psychiatric: Affect and judgment normal.      Assessment & Plan:  Chest wall pain Chest wall pain most consistent with MSK etiology, as reproducible on exam, improved with ibuprofen, non-radiating, and occurring following lifting heavy object. Less concern for cardiac etiology given chronicity of pain, nature of pain, reproducibility, and normal cardiac exam. Also less likely pulmonary etiology as patient denying SOB and respiratory exam NL. Ibuprofen PRN. Can use heating pad or ice pack as well. Refrain from lifting heavy objects for next few days.    Tarri AbernethyAbigail J Lancaster, MD, MPH PGY-3 [facility] Pager [phone]

## 2017-06-03 NOTE — Patient Instructions (Addendum)
It was nice meeting you today Erin Osborn!  Your chest pain is most likely due to a muscle strain from lifting heavy laundry. Please continue to take ibuprofen up to every 6 hours as needed for pain. You can also try putting a heating pad or ice pack on the painful area. Resting and avoiding lifting heavy things over the next few days will also be helpful.   If your pain worsens, or you develop shortness of breath, radiating pain, or nausea or vomiting with your chest pain, please call our clinic or go to the emergency room.   If you have any questions or concerns, please feel free to call the clinic.   Be well,  Dr. Natale MilchLancaster

## 2018-06-07 IMAGING — US US OB COMP LESS 14 WK
1 series · 15 of 28 positions shown · non-contrast
Comparison: Obstetric ultrasound October 07, 2016

ADDENDUM:
Critical Value/emergent results were called by telephone at the time
of interpretation on 11/07/2016 at [DATE] to Dr. QUIRIJN AMAZIGH ,
who verbally acknowledged these results.
CLINICAL DATA: Vaginal bleeding, first trimester.

EXAM:
OBSTETRIC <14 WK ULTRASOUND
TECHNIQUE: Transabdominal ultrasound was performed for evaluation of the
gestation as well as the maternal uterus and adnexal regions.

[Series 1: us ob comp less 14 wk · 32 acquisitions, 15 frames shown]
[im 1/32]
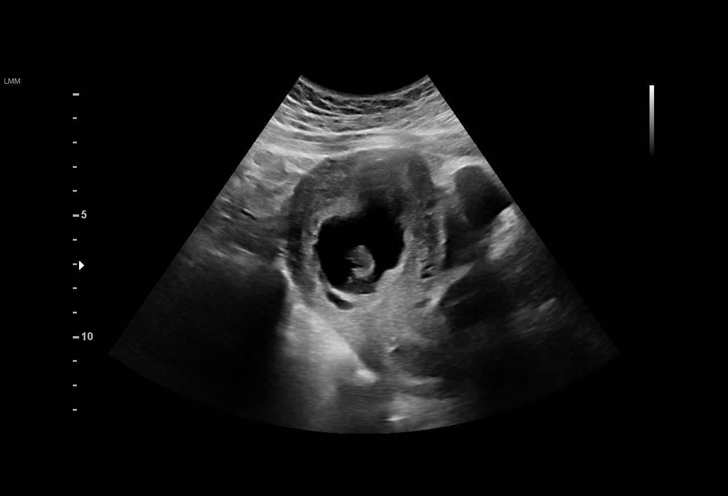
[im 3/32]
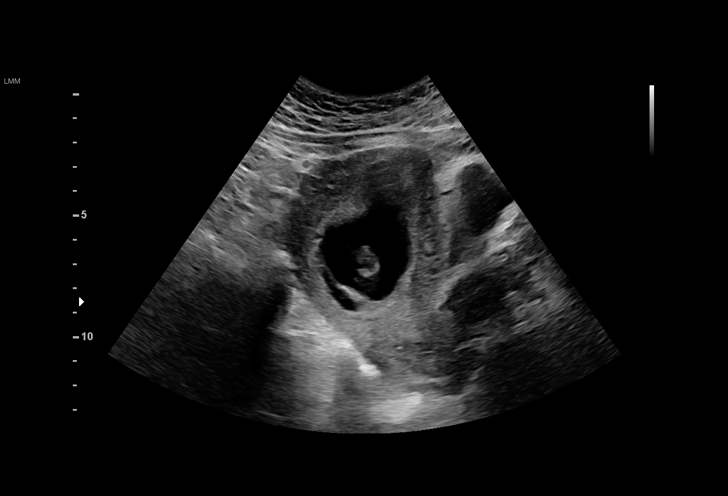
[im 5/32]
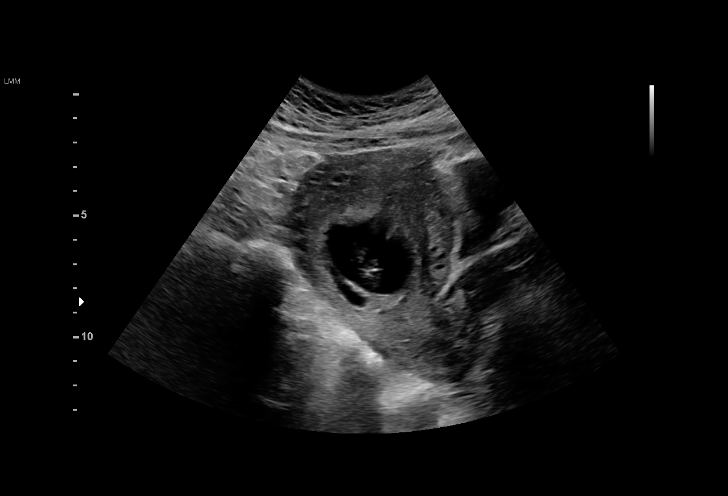
[im 7/32]
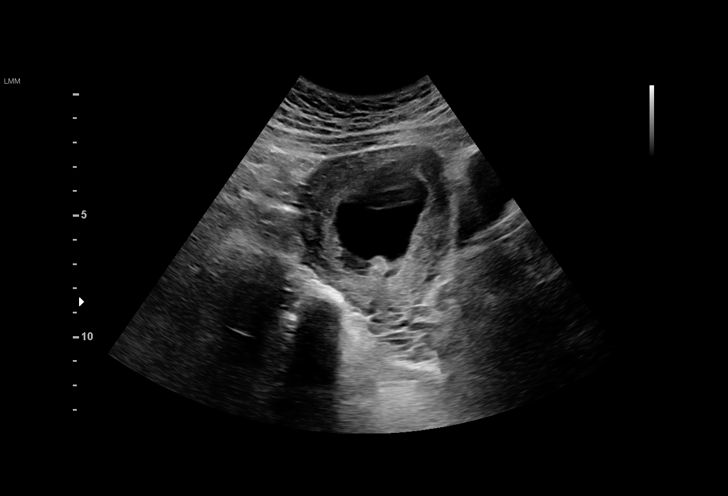
[im 10/32]
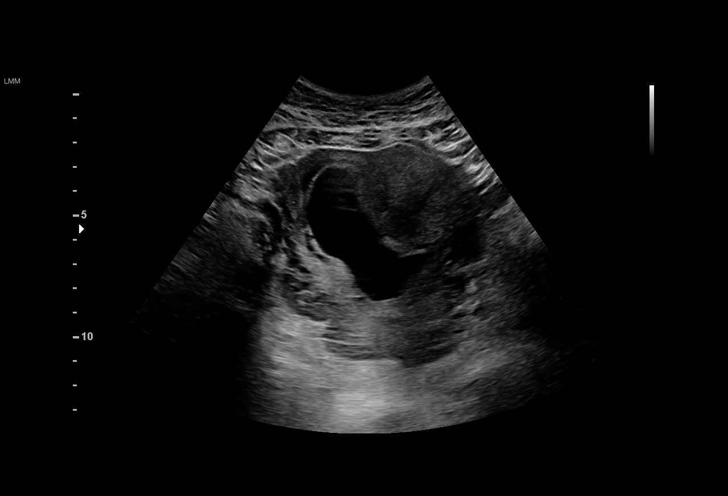
[im 12/32]
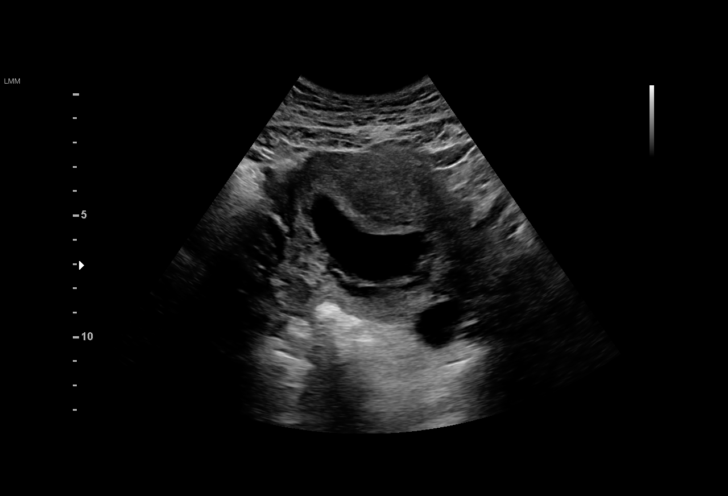
[im 14/32]
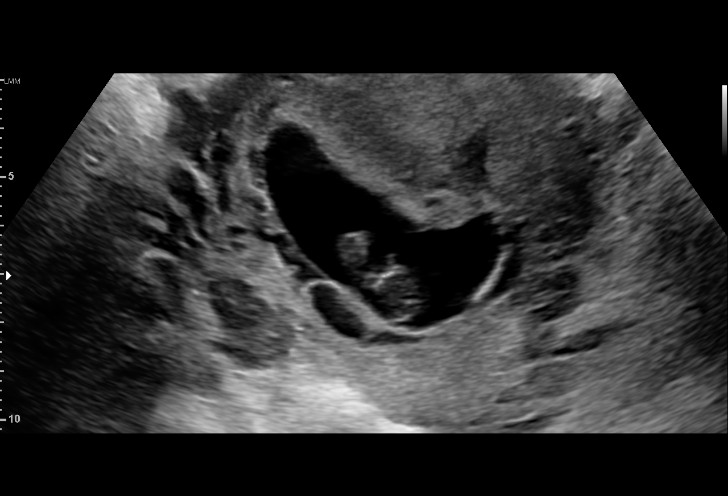
[im 17/32]
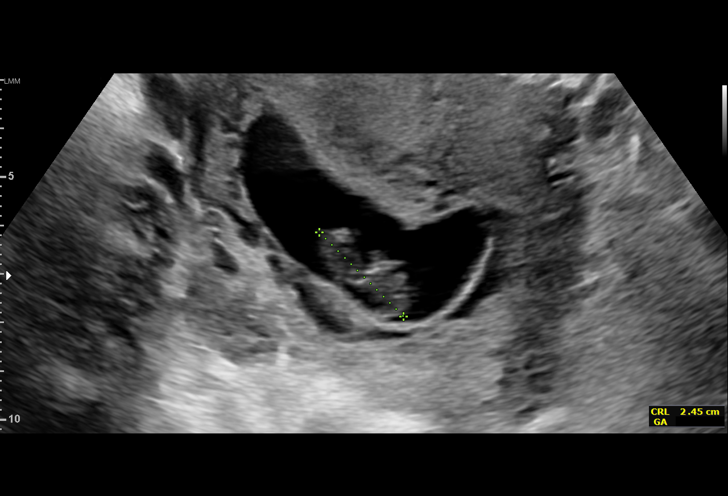
[im 18/32]
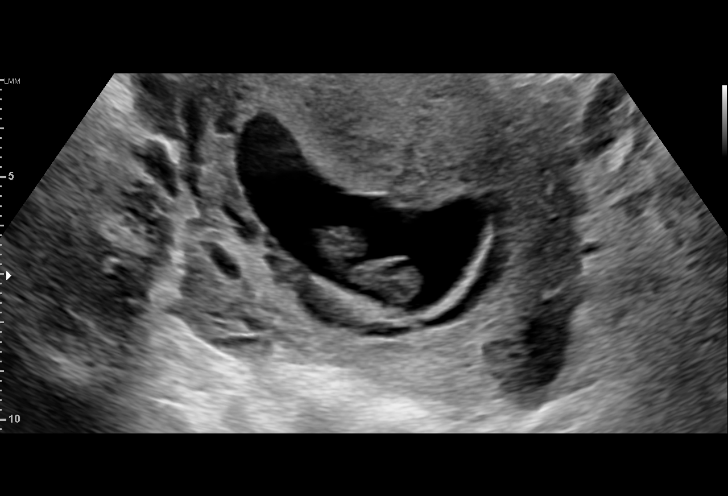
[im 20/32]
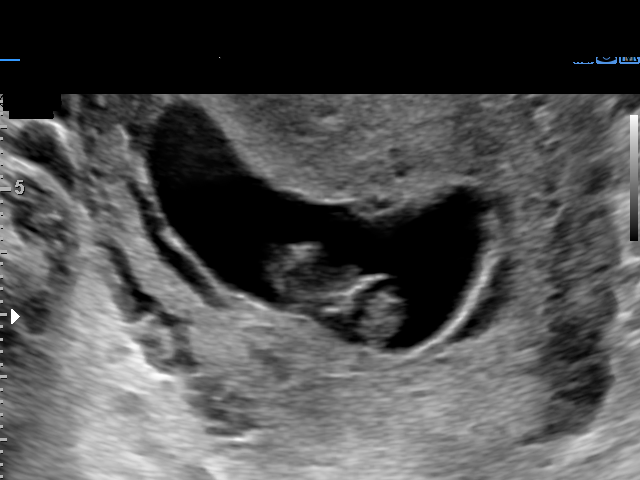
[im 22/32]
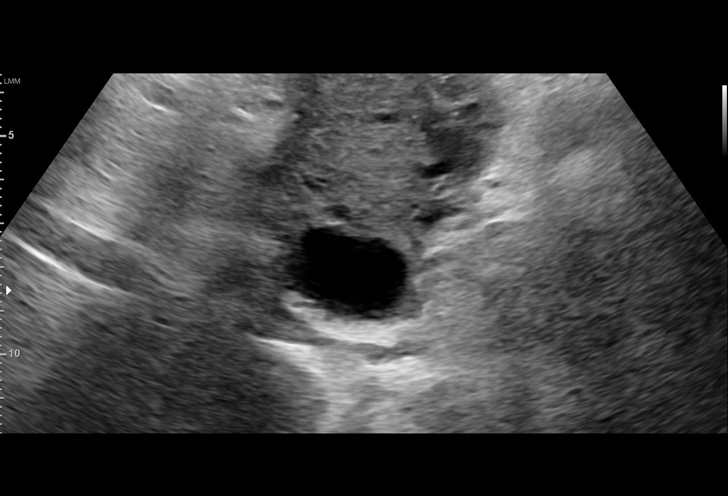
[im 25/32]
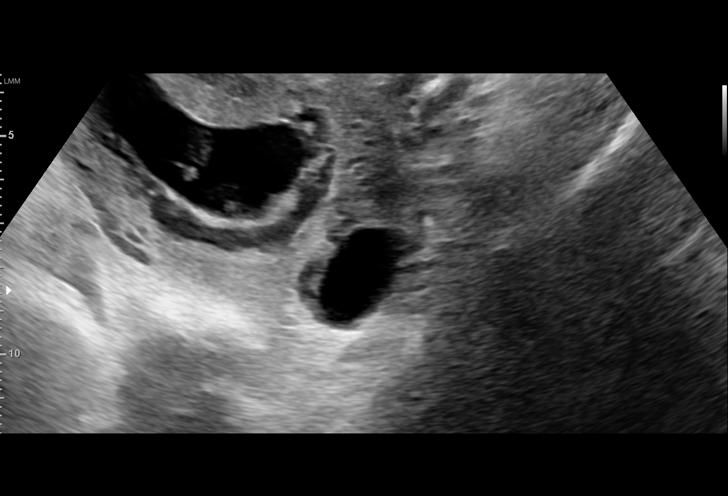
[im 27/32]
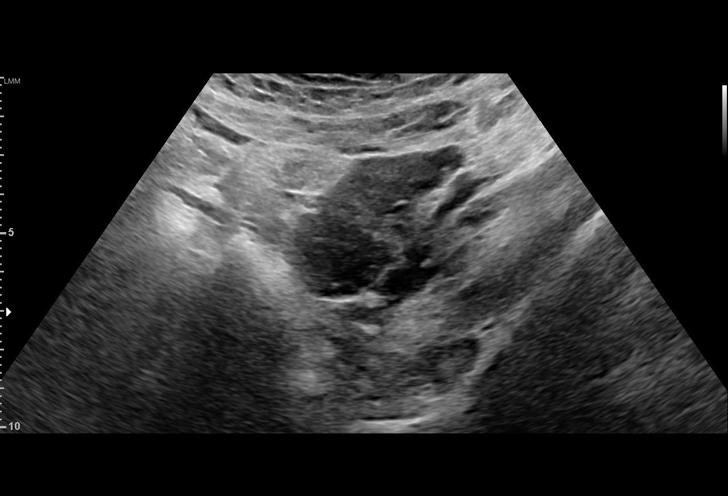
[im 29/32]
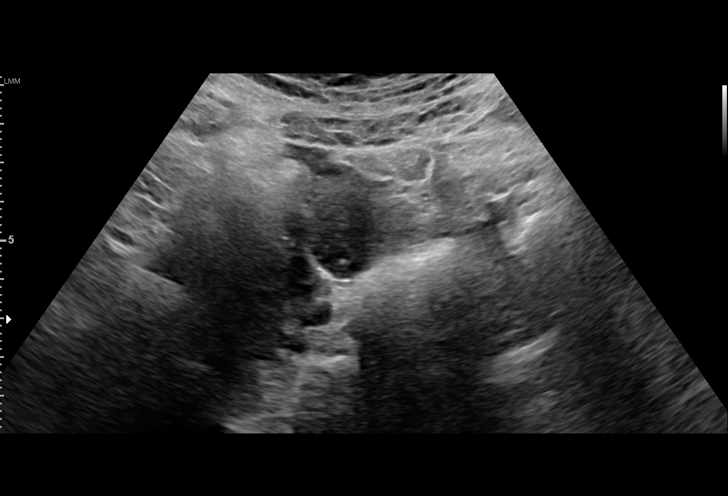
[im 32/32]
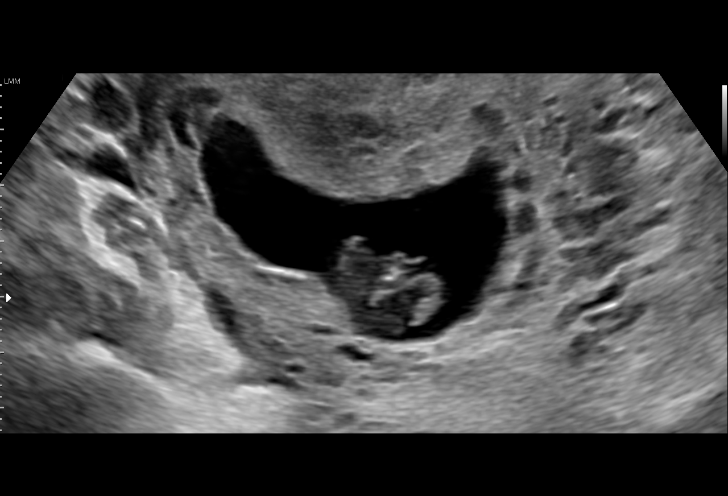

[15 of 28 positions shown; findings below may reference images not displayed]

FINDINGS: Intrauterine gestational sac: Present though, abnormal shape.

Yolk sac:  Not present

Embryo:  Present

Cardiac Activity: Not present

CRL:   24  mm   9 w 1 d

Subchorionic hemorrhage: Small residual anechoic subchorionic
hemorrhage.

Maternal uterus/adnexae: 2.6 x 1.7 cm LEFT adnexal cyst was 5.9 cm.
IMPRESSION: Findings meet definitive criteria for failed pregnancy. This follows
SRU consensus guidelines: Diagnostic Criteria for Nonviable
Pregnancy Early in the First Trimester. N Engl J Med

## 2018-09-05 ENCOUNTER — Other Ambulatory Visit: Payer: Self-pay | Admitting: *Deleted

## 2018-09-05 DIAGNOSIS — R6889 Other general symptoms and signs: Secondary | ICD-10-CM

## 2018-09-05 MED ORDER — OLOPATADINE HCL 0.2 % OP SOLN: 1.0000 [drp] | Freq: Every day | OPHTHALMIC | 1 refills | Status: DC

## 2019-01-10 ENCOUNTER — Emergency Department (HOSPITAL_COMMUNITY): Payer: Medicaid Other

## 2019-01-10 ENCOUNTER — Other Ambulatory Visit: Payer: Self-pay

## 2019-01-10 ENCOUNTER — Encounter (HOSPITAL_COMMUNITY): Payer: Self-pay

## 2019-01-10 ENCOUNTER — Emergency Department (HOSPITAL_COMMUNITY)
Admission: EM | Admit: 2019-01-10 | Discharge: 2019-01-10 | Disposition: A | Discharge status: Home / Self Care | Payer: Medicaid Other | Attending: Emergency Medicine | Admitting: Emergency Medicine

## 2019-01-10 DIAGNOSIS — Z9049 Acquired absence of other specified parts of digestive tract: Secondary | ICD-10-CM | POA: Diagnosis not present

## 2019-01-10 DIAGNOSIS — R002 Palpitations: Secondary | ICD-10-CM | POA: Diagnosis present

## 2019-01-10 DIAGNOSIS — Z79899 Other long term (current) drug therapy: Secondary | ICD-10-CM | POA: Diagnosis not present

## 2019-01-10 DIAGNOSIS — E876 Hypokalemia: Secondary | ICD-10-CM | POA: Diagnosis not present

## 2019-01-10 LAB — BASIC METABOLIC PANEL
Anion gap: 10 (ref 5–15)
BUN: 9 mg/dL (ref 6–20)
CO2: 23 mmol/L (ref 22–32)
Calcium: 8.9 mg/dL (ref 8.9–10.3)
Chloride: 108 mmol/L (ref 98–111)
Creatinine, Ser: 0.7 mg/dL (ref 0.44–1.00)
GFR calc Af Amer: >60 mL/min (ref >60)
GFR calc non Af Amer: >60 mL/min (ref >60)
Glucose, Bld: 100 mg/dL — ABNORMAL HIGH (ref 70–99)
Potassium: 3 mmol/L — ABNORMAL LOW (ref 3.5–5.1)
Sodium: 141 mmol/L (ref 135–145)

## 2019-01-10 LAB — CBC
HCT: 38.7 % (ref 36.0–46.0)
Hemoglobin: 12.7 g/dL (ref 12.0–15.0)
MCH: 28.5 pg (ref 26.0–34.0)
MCHC: 32.8 g/dL (ref 30.0–36.0)
MCV: 87 fL (ref 80.0–100.0)
Platelets: 283 10*3/uL (ref 150–400)
RBC: 4.45 MIL/uL (ref 3.87–5.11)
RDW: 14.1 % (ref 11.5–15.5)
WBC: 8.8 10*3/uL (ref 4.0–10.5)
nRBC: 0 % (ref 0.0–0.2)

## 2019-01-10 LAB — I-STAT BETA HCG BLOOD, ED (MC, WL, AP ONLY): I-stat hCG, quantitative: <5 m[IU]/mL (ref <5)

## 2019-01-10 LAB — TROPONIN I (HIGH SENSITIVITY)
Troponin I (High Sensitivity): 3 ng/L (ref <18)
Troponin I (High Sensitivity): 3 ng/L (ref <18)

## 2019-01-10 MED ORDER — POTASSIUM CHLORIDE 10 MEQ/100ML IV SOLN
10.0000 meq | Freq: Once | INTRAVENOUS | Status: AC
  Administered 2019-01-10: 10 meq via INTRAVENOUS
  Filled 2019-01-10: qty 100

## 2019-01-10 MED ORDER — SODIUM CHLORIDE 0.9 % IV BOLUS
1000.0000 mL | Freq: Once | INTRAVENOUS | Status: AC
  Administered 2019-01-10: 1000 mL via INTRAVENOUS

## 2019-01-10 MED ORDER — POTASSIUM CHLORIDE CRYS ER 20 MEQ PO TBCR
40.0000 meq | EXTENDED_RELEASE_TABLET | Freq: Once | ORAL | Status: AC
  Administered 2019-01-10: 12:00:00 40 meq via ORAL
  Filled 2019-01-10: qty 2

## 2019-01-10 MED ORDER — SODIUM CHLORIDE 0.9% FLUSH
3.0000 mL | Freq: Once | INTRAVENOUS | Status: AC
  Administered 2019-01-10: 11:00:00 3 mL via INTRAVENOUS

## 2019-01-10 NOTE — ED Triage Notes (Signed)
Patient complains of 2 days of palpitations-reports this am more of a pain. States that the discomfort is mild and non-radiating, NAD

## 2019-01-10 NOTE — Discharge Instructions (Signed)
Please follow up with a primary care provider for further evaluation of your heart palpitation.  Eat a banana a day for 1 week as your potassium level is low today. Return if you have any concerns.

## 2019-01-10 NOTE — ED Provider Notes (Signed)
[place] EMERGENCY DEPARTMENT Provider Note   CSN: [id number] Arrival date & time: 01/10/19  7510     History   Chief Complaint Chief Complaint  Patient presents with  . Palpitations    HPI Erin Osborn is a 38 y.o. female.     The history is provided by the patient. No language interpreter was used.  Palpitations    38 year old female presenting for to the ED today for evaluation of heart palpitation.  Patient report for the past 2 days she has had intermittent heart palpitation.  She described as heart racing, lasting for a few seconds, and will just dissipate without any provoking or aggravating factor.  She also felt a bit lightheadedness and dizziness during 1 of the episode lasting for less than a minute.  She has never had these Symptoms before.  She denies any fever or chills, denies any runny nose sneezing or coughing or any recent sick contact.  No chest pain, no hemoptysis, no abdominal pain, nausea or vomiting or diarrhea or dysuria.  No recent medication changes, denies using any kind of energy drinks.  She denies increasing stress.  She denies alcohol use or tobacco use.  She is otherwise healthy.  Past Medical History:  Diagnosis Date  . Migraines    otc med prn  . SVD (spontaneous vaginal delivery)    x 2    Patient Active Problem List   Diagnosis Date Noted  . Chest wall pain 06/03/2017  . Allergic conjunctivitis 12/12/2015  . Elevated glucose tolerance test 04/28/2015  . Rash and nonspecific skin eruption 10/30/2012  . Blepharoconjunctivitis 03/30/2011  . Chronic headaches 10/08/2010  . ALLERGIC RHINITIS 10/31/2008  . OBESITY 08/02/2008  . GERD 06/21/2008  . DEPRESSIVE DISORDER, NOS 08/11/2006  . Hemorrhoids 08/11/2006    Past Surgical History:  Procedure Laterality Date  . CHOLECYSTECTOMY     Gallstones removed-Around 38 years of age  . DILATION AND CURETTAGE OF UTERUS N/A 10/18/2014   Procedure: SUCTION DILATATION AND  CURETTAGE;  Surgeon: Woodroe Mode, MD;  Location: [facility];  Service: Gynecology;  Laterality: N/A;  . DILATION AND EVACUATION N/A 11/12/2016   Procedure: DILATATION AND EVACUATION;  Surgeon: Allyn Kenner, DO;  Location: [facility];  Service: Gynecology;  Laterality: N/A;     OB History   This patient's OB History needs to be verified. Open OB History to review and resolve any issues.  Gravida  5   Para  2   Term  2   Preterm      AB  2   Living  2     SAB  2   TAB      Ectopic      Multiple  0   Live Births  2            Home Medications    Prior to Admission medications   Medication Sig Start Date End Date Taking? Authorizing Provider  benzonatate (TESSALON) 200 MG capsule Take 1 capsule (200 mg total) 3 (three) times daily as needed by mouth for cough. 04/26/17   Smiley Houseman, MD  fluticasone (FLONASE) 50 MCG/ACT nasal spray Place 2 sprays into both nostrils daily. 11/22/16   Mercy Riding, MD  hydrocortisone (ANUSOL-HC) 2.5 % rectal cream Place 1 application 2 (two) times daily rectally. 04/19/17   Tasia Catchings, Amy V, PA-C  hydrocortisone cream 1 % Apply 1 application topically 2 (two) times daily as needed (eczema).    [provider]  ibuprofen (ADVIL,MOTRIN) 600 MG tablet Take 1 tablet (600 mg total) by mouth 3 (three) times daily. 02/15/17   Cathie HoopsYu, Amy V, PA-C  ipratropium (ATROVENT) 0.06 % nasal spray Place 2 sprays 4 (four) times daily into both nostrils. 04/26/17   Palma HolterGunadasa, Kanishka G, MD  Lidocaine, Anorectal, 5 % CREA Apply to affected area <6 times daily (morning, evening, and after each bowel movement) 04/19/17   Cathie HoopsYu, Amy V, PA-C  loratadine (CLARITIN) 10 MG tablet Take 1 tablet (10 mg total) by mouth daily as needed for allergies. 02/15/17   Cathie HoopsYu, Amy V, PA-C  medroxyPROGESTERone (DEPO-PROVERA) 150 MG/ML injection Present to office next week with syringe for injection 11/12/16   Philip Aspenallahan, Sidney, DO  mupirocin ointment (BACTROBAN) 2 % Apply 1 application  topically 2 (two) times daily. 02/15/17   Cathie HoopsYu, Amy V, PA-C  Olopatadine HCl (PATADAY) 0.2 % SOLN Apply 1 drop to eye daily. 09/05/18   Oralia ManisAbraham, Sherin, DO  oxyCODONE-acetaminophen (PERCOCET/ROXICET) 5-325 MG tablet Take 1 tablet by mouth every 6 (six) hours as needed for severe pain. 11/12/16   Philip Aspenallahan, Sidney, DO  polyethylene glycol [facility](MIRALAX) packet Take 17 g daily by mouth. 04/19/17   Belinda FisherYu, Amy V, PA-C    Family History Family History  Problem Relation Age of Onset  . Hypertension Mother   . Diabetes Mother   . Hypertension Maternal Grandmother   . Diabetes Maternal Grandmother   . Alcohol abuse Father   . Drug abuse Father   . Hypertension Sister   . Drug abuse Brother   . Alcohol abuse Brother   . Acute myelogenous leukemia Neg Hx     Social History Social History   Tobacco Use  . Smoking status: Never Smoker  . Smokeless tobacco: Never Used  Substance Use Topics  . Alcohol use: No  . Drug use: No     Allergies   Patient has no known allergies.   Review of Systems Review of Systems  Cardiovascular: Positive for palpitations.  All other systems reviewed and are negative.    Physical Exam Updated Vital Signs BP 129/89 (BP Location: Right Arm)   Pulse 75   Temp 98.8 F (37.1 C) (Oral)   Resp 16   SpO2 99%   Physical Exam Vitals signs and nursing note reviewed.  Constitutional:      General: She is not in acute distress.    Appearance: She is well-developed.  HENT:     Head: Atraumatic.  Eyes:     Conjunctiva/sclera: Conjunctivae normal.  Neck:     Musculoskeletal: Neck supple.     Comments: No thyromegaly Cardiovascular:     Rate and Rhythm: Normal rate and regular rhythm.     Heart sounds: No murmur. No friction rub. No gallop.   Pulmonary:     Effort: Pulmonary effort is normal.     Breath sounds: Normal breath sounds. No wheezing, rhonchi or rales.  Abdominal:     Palpations: Abdomen is soft.     Tenderness: There is no abdominal tenderness.   Musculoskeletal: Normal range of motion.  Skin:    Capillary Refill: Capillary refill takes less than 2 seconds.     Findings: No rash.  Neurological:     Mental Status: She is alert and oriented to person, place, and time.  Psychiatric:        Mood and Affect: Mood normal.      ED Treatments / Results  Labs (all labs ordered are listed, but only  abnormal results are displayed) Labs Reviewed  BASIC METABOLIC PANEL - Abnormal; Notable for the following components:      Result Value   Potassium 3.0 (*)    Glucose, Bld 100 (*)    All other components within normal limits  CBC  I-STAT BETA HCG BLOOD, ED ([facility], [facility], [facility] ONLY)  TROPONIN I (HIGH SENSITIVITY)  TROPONIN I (HIGH SENSITIVITY)    EKG None  ED ECG REPORT   Date: 01/10/2019  Rate: 75  Rhythm: normal sinus rhythm and sinus arrhythmia  QRS Axis: normal  Intervals: normal  ST/T Wave abnormalities: normal  Conduction Disutrbances:none  Narrative Interpretation:   Old EKG Reviewed: none available  I have personally reviewed the EKG tracing and agree with the computerized printout as noted.   Radiology Dg Chest 2 View  Result Date: 01/10/2019 CLINICAL DATA:  Chest fluttering and shortness of breath for 3 days. EXAM: CHEST - 2 VIEW COMPARISON:  09/14/2015 FINDINGS: The heart size and mediastinal contours are within normal limits. Both lungs are clear. The visualized skeletal structures are unremarkable. IMPRESSION: Normal chest x-ray. Electronically Signed   By: Rudie MeyerP.  Gallerani M.D.   On: 01/10/2019 09:15    Procedures Procedures (including critical care time)  Medications Ordered in ED Medications  potassium chloride 10 mEq in 100 mL IVPB (10 mEq Intravenous New Bag/Given 01/10/19 1148)  sodium chloride flush (NS) 0.9 % injection 3 mL (3 mLs Intravenous Given 01/10/19 1052)  sodium chloride 0.9 % bolus 1,000 mL (1,000 mLs Intravenous New Bag/Given 01/10/19 1146)  potassium chloride SA (K-DUR) CR tablet 40 mEq (40 mEq  Oral Given 01/10/19 1145)     Initial Impression / Assessment and Plan / ED Course  I have reviewed the triage vital signs and the nursing notes.  Pertinent labs & imaging results that were available during my care of the patient were reviewed by me and considered in my medical decision making (see chart for details).        BP 120/70   Pulse 61   Temp 98.8 F (37.1 C) (Oral)   Resp 19   SpO2 100%    Final Clinical Impressions(s) / ED Diagnoses   Final diagnoses:  Heart palpitations  Hypokalemia    ED Discharge Orders    None     11:20 AM Patient here for intermittent heart palpitation.  Report one episode of lightheadedness yesterday.  Patient otherwise well-appearing.  No provocative factor.  EKG shows sinus rhythm with occasional sinus arrhythmia.  No concerning arrhythmia appreciated.  Labs remarkable for a mild hypokalemia with potassium of 3.0, will give supplementation.  Plan to give IV fluid and check orthostatic vital sign.  Remainder of her labs are reassuring.  No evidence of thyromegaly.  12:35 PM Negative delta troponin, proceed test is negative, chest x-ray unremarkable, as mentioned earlier EKG without concerning arrhythmia and no evidence of anemia.  No orthostatic vital sign.  At this time, encourage patient to follow-up with PCP for further evaluation.  Encourage patient to eat a banana daily for the next week to help with low potassium.  Return precaution discussed.     Fayrene Helperran, Deloyd Handy, PA-C 01/10/19 1404    Azalia Bilisampos, Kevin, MD 01/10/19 [phone]

## 2019-01-31 NOTE — Progress Notes (Signed)
   Subjective:    Patient ID: Erin Osborn, female    DOB: 1980-08-22, 38 y.o.   MRN: [id number]   CC: Elevated BP  HPI: Hypertension: Patient with no previous history of hypertension.  Family history significant for HTN in sister (who is 30 and has had elevated BP for some time) and mother. Recent BMP from 01/10/2019 showing normal creatinine. Patient is on Depo-Provera for contraception.  Patient reports that she recently went to the hospital and they told her her blood pressure was high.  At that time troponin was negative and chest x-ray was negative.  EKG was within normal limits.  States that she went to work on Sunday and had a syncopal episode.  At that time her blood pressure had a systolic of 956.  States that she was dizzy and nauseous at that time.  States that even yesterday her employer told her her numbers were high and told her in heat she cannot return to work until seen a physician.  Currently has no chest pain.  Does report a headache. - Checking BP at home: no; blood pressures have been elevated. - Denies any SOB, CP, vision changes, LE edema, medication SEs, or symptoms of hypotension - Diet: not good, eats a lot of junk food and soda. Lots of fried food  - Exercise: walks daily  - Works at a nursing home, Proofreader.  - does not smoke - no ETOH - no illicit drug use     Objective:  BP 135/80 (BP Location: Left Arm, Patient Position: Sitting, Cuff Size: Large)   Pulse 72   Wt 209 lb 12.8 oz (95.2 kg)   SpO2 100%   BMI 40.97 kg/m  Vitals and nursing note reviewed  General: well nourished, in no acute distress HEENT: normocephalic, TM's visualized bilaterally, no scleral icterus or conjunctival pallor, no nasal discharge, moist mucous membranes, good dentition without erythema or discharge noted in posterior oropharynx Neck: supple, non-tender, without lymphadenopathy Cardiac: RRR, clear S1 and S2, no murmurs, rubs, or gallops Respiratory: clear to  auscultation bilaterally, no increased work of breathing Abdomen: soft, nontender, nondistended, no masses or organomegaly. Bowel sounds present Extremities: no edema or cyanosis. Warm, well perfused. 2+ radial and PT pulses bilaterally Skin: warm and dry, no rashes noted Neuro: alert and oriented, no focal deficits   Assessment & Plan:    Hypertension Patient with new diagnosis of essential hypertension.  Although she is very young she has a history of obesity and strong family history of hypertension in her mother and sister, who are both young at time of diagnosis.  Creatinine on most recent BMP is within normal limits.  Patient likely has essential hypertension.  We will plan to start Norvasc at 5 mg.  Follow-up in nursing clinic in 1 week.  1 month with MD.  If blood pressure is elevated can in nursing clinic can increase to 10 mg.  Advised to check blood pressures daily at home.  Advised to call with low or high blood pressure readings.  Strict return precautions given.  Advised to go the emergency department with any headache, vision change, chest pain, shortness of breath, syncope.  Follow-up in 1 week and RN clinic, within 1 month with MD.    Return in about 1 week (around 02/08/2019) for BP check.   Caroline More, DO, PGY-3

## 2019-02-01 ENCOUNTER — Ambulatory Visit: Payer: Medicaid Other | Admitting: Family Medicine

## 2019-02-01 ENCOUNTER — Other Ambulatory Visit: Payer: Self-pay

## 2019-02-01 ENCOUNTER — Encounter: Payer: Self-pay | Admitting: Family Medicine

## 2019-02-01 DIAGNOSIS — I10 Essential (primary) hypertension: Secondary | ICD-10-CM | POA: Insufficient documentation

## 2019-02-01 MED ORDER — AMLODIPINE BESYLATE 5 MG PO TABS: 5.0000 mg | ORAL_TABLET | Freq: Every day | ORAL | 0 refills | Status: DC

## 2019-02-01 NOTE — Patient Instructions (Signed)
Hypertension, Adult Hypertension is another name for high blood pressure. High blood pressure forces your heart to work harder to pump blood. This can cause problems over time. There are two numbers in a blood pressure reading. There is a top number (systolic) over a bottom number (diastolic). It is best to have a blood pressure that is below 120/80. Healthy choices can help lower your blood pressure, or you may need medicine to help lower it. What are the causes? The cause of this condition is not known. Some conditions may be related to high blood pressure. What increases the risk?  Smoking.  Having type 2 diabetes mellitus, high cholesterol, or both.  Not getting enough exercise or physical activity.  Being overweight.  Having too much fat, sugar, calories, or salt (sodium) in your diet.  Drinking too much alcohol.  Having long-term (chronic) kidney disease.  Having a family history of high blood pressure.  Age. Risk increases with age.  Race. You may be at higher risk if you are African American.  Gender. Men are at higher risk than women before age 45. After age 65, women are at higher risk than men.  Having obstructive sleep apnea.  Stress. What are the signs or symptoms?  High blood pressure may not cause symptoms. Very high blood pressure (hypertensive crisis) may cause: ? Headache. ? Feelings of worry or nervousness (anxiety). ? Shortness of breath. ? Nosebleed. ? A feeling of being sick to your stomach (nausea). ? Throwing up (vomiting). ? Changes in how you see. ? Very bad chest pain. ? Seizures. How is this treated?  This condition is treated by making healthy lifestyle changes, such as: ? Eating healthy foods. ? Exercising more. ? Drinking less alcohol.  Your health care provider may prescribe medicine if lifestyle changes are not enough to get your blood pressure under control, and if: ? Your top number is above 130. ? Your bottom number is above 80.   Your personal target blood pressure may vary. Follow these instructions at home: Eating and drinking   If told, follow the DASH eating plan. To follow this plan: ? Fill one half of your plate at each meal with fruits and vegetables. ? Fill one fourth of your plate at each meal with whole grains. Whole grains include whole-wheat pasta, brown rice, and whole-grain bread. ? Eat or drink low-fat dairy products, such as skim milk or low-fat yogurt. ? Fill one fourth of your plate at each meal with low-fat (lean) proteins. Low-fat proteins include fish, chicken without skin, eggs, beans, and tofu. ? Avoid fatty meat, cured and processed meat, or chicken with skin. ? Avoid pre-made or processed food.  Eat less than 1,500 mg of salt each day.  Do not drink alcohol if: ? Your doctor tells you not to drink. ? You are pregnant, may be pregnant, or are planning to become pregnant.  If you drink alcohol: ? Limit how much you use to:  0-1 drink a day for women.  0-2 drinks a day for men. ? Be aware of how much alcohol is in your drink. In the [place] one drink equals one 12 oz bottle of beer (355 mL), one 5 oz glass of wine (148 mL), or one 1 oz glass of hard liquor (44 mL). Lifestyle   Work with your doctor to stay at a healthy weight or to lose weight. Ask your doctor what the best weight is for you.  Get at least 30 minutes of exercise most   days of the week. This may include walking, swimming, or biking.  Get at least 30 minutes of exercise that strengthens your muscles (resistance exercise) at least 3 days a week. This may include lifting weights or doing Pilates.  Do not use any products that contain nicotine or tobacco, such as cigarettes, e-cigarettes, and chewing tobacco. If you need help quitting, ask your doctor.  Check your blood pressure at home as told by your doctor.  Keep all follow-up visits as told by your doctor. This is important. Medicines  Take over-the-counter and  prescription medicines only as told by your doctor. Follow directions carefully.  Do not skip doses of blood pressure medicine. The medicine does not work as well if you skip doses. Skipping doses also puts you at risk for problems.  Ask your doctor about side effects or reactions to medicines that you should watch for. Contact a doctor if you:  Think you are having a reaction to the medicine you are taking.  Have headaches that keep coming back (recurring).  Feel dizzy.  Have swelling in your ankles.  Have trouble with your vision. Get help right away if you:  Get a very bad headache.  Start to feel mixed up (confused).  Feel weak or numb.  Feel faint.  Have very bad pain in your: ? Chest. ? Belly (abdomen).  Throw up more than once.  Have trouble breathing. Summary  Hypertension is another name for high blood pressure.  High blood pressure forces your heart to work harder to pump blood.  For most people, a normal blood pressure is less than 120/80.  Making healthy choices can help lower blood pressure. If your blood pressure does not get lower with healthy choices, you may need to take medicine. This information is not intended to replace advice given to you by your health care provider. Make sure you discuss any questions you have with your health care provider. Document Released: 11/17/2007 Document Revised: 02/08/2018 Document Reviewed: 02/08/2018 Elsevier Patient Education  2020 [place] your blood pressures daily.  If your blood pressure is above 140/90 please give [place] a call.  I want you to follow-up in 1 week in the nursing clinic for blood pressure check.  I want you to follow-up in 1 month with me or another physician for your blood pressure.  If you are having symptoms of headache, dizziness, vision changes please go to the emergency department.  If you have chest pain or shortness of breath please go directly to the emergency department.  If you  have any low or high blood pressures please give [place] a call.  Dr. Tammi Klippel

## 2019-02-01 NOTE — Assessment & Plan Note (Signed)
Patient with new diagnosis of essential hypertension.  Although she is very young she has a history of obesity and strong family history of hypertension in her mother and sister, who are both young at time of diagnosis.  Creatinine on most recent BMP is within normal limits.  Patient likely has essential hypertension.  We will plan to start Norvasc at 5 mg.  Follow-up in nursing clinic in 1 week.  1 month with MD.  If blood pressure is elevated can in nursing clinic can increase to 10 mg.  Advised to check blood pressures daily at home.  Advised to call with low or high blood pressure readings.  Strict return precautions given.  Advised to go the emergency department with any headache, vision change, chest pain, shortness of breath, syncope.  Follow-up in 1 week and RN clinic, within 1 month with MD.

## 2019-02-15 ENCOUNTER — Ambulatory Visit: Payer: Medicaid Other

## 2019-02-15 ENCOUNTER — Other Ambulatory Visit: Payer: Self-pay

## 2019-02-15 VITALS — BP 112/62

## 2019-02-15 DIAGNOSIS — I10 Essential (primary) hypertension: Secondary | ICD-10-CM

## 2019-02-15 NOTE — Progress Notes (Signed)
Pt had appt with nurse clinic for BP check.

## 2019-03-02 NOTE — Progress Notes (Deleted)
   Subjective:    Patient ID: Erin Osborn, female    DOB: 1980-10-25, 38 y.o.   MRN: [id number]   CC:  HPI: Hypertension: - Medications: norvasc 5mg  - Compliance: *** - Checking BP at home: *** - Denies any SOB, CP, vision changes, LE edema, medication SEs, or symptoms of hypotension - Diet: *** - Exercise: ***   Smoking status reviewed  Review of Systems   Objective:  BP 115/70   Pulse 73   SpO2 99%  Vitals and nursing note reviewed  General: well nourished, in no acute distress HEENT: normocephalic, TM's visualized bilaterally, no scleral icterus or conjunctival pallor, no nasal discharge, moist mucous membranes, good dentition without erythema or discharge noted in posterior oropharynx Neck: supple, non-tender, without lymphadenopathy Cardiac: RRR, clear S1 and S2, no murmurs, rubs, or gallops Respiratory: clear to auscultation bilaterally, no increased work of breathing Abdomen: soft, nontender, nondistended, no masses or organomegaly. Bowel sounds present Extremities: no edema or cyanosis. Warm, well perfused. 2+ radial and PT pulses bilaterally Skin: warm and dry, no rashes noted Neuro: alert and oriented, no focal deficits   Assessment & Plan:    No problem-specific Assessment & Plan notes found for this encounter.    No follow-ups on file.   Caroline More, DO, PGY-3

## 2019-03-06 ENCOUNTER — Other Ambulatory Visit: Payer: Self-pay

## 2019-03-06 ENCOUNTER — Ambulatory Visit: Payer: Medicaid Other | Admitting: Family Medicine

## 2019-03-06 VITALS — BP 115/70 | HR 73

## 2019-03-06 DIAGNOSIS — I10 Essential (primary) hypertension: Secondary | ICD-10-CM | POA: Diagnosis not present

## 2019-03-06 DIAGNOSIS — R21 Rash and other nonspecific skin eruption: Secondary | ICD-10-CM | POA: Diagnosis present

## 2019-03-06 DIAGNOSIS — Z23 Encounter for immunization: Secondary | ICD-10-CM

## 2019-03-06 MED ORDER — TRIAMCINOLONE ACETONIDE 0.1 % EX OINT: 1.0000 "application " | TOPICAL_OINTMENT | Freq: Two times a day (BID) | CUTANEOUS | 1 refills | Status: DC

## 2019-03-06 NOTE — Assessment & Plan Note (Addendum)
Patient was diagnosed with essential hypertension at recent clinic visit (08/20) and was prescribed Norvasc (5mg , daily) at that time. She presents today for a blood pressure check. Follow-up blood pressure with nursing on 09/03 was normal (112/62). Blood pressure today is also normal (115/70). Blood pressure appears well-controlled with medication. Patient advised to continue managing hypertension with Norvasc 5 mg, daily. Also counseled to increase fluid intake to improve symptoms of dizziness and lightheadedness as this is likely due to mild orthostatic hypotension secondary to dehydration. Told to return in 3 month for follow-up blood pressure check. Also counseled to contact clinic for follow-up if symptoms of dizziness and lightheadedness persist.  Advised to continue healthy diet and daily exercise.  Strict return precautions given.

## 2019-03-06 NOTE — Patient Instructions (Addendum)
Atopic Dermatitis Atopic dermatitis is a skin disorder that causes inflammation of the skin. This is the most common type of eczema. Eczema is a group of skin conditions that cause the skin to be itchy, red, and swollen. This condition is generally worse during the cooler winter months and often improves during the warm summer months. Symptoms can vary from person to person. Atopic dermatitis usually starts showing signs in infancy and can last through adulthood. This condition cannot be passed from one person to another (non-contagious), but it is more common in families. Atopic dermatitis may not always be present. When it is present, it is called a flare-up. What are the causes? The exact cause of this condition is not known. Flare-ups of the condition may be triggered by:  Contact with something that you are sensitive or allergic to.  Stress.  Certain foods.  Extremely hot or cold weather.  Harsh chemicals and soaps.  Dry air.  Chlorine. What increases the risk? This condition is more likely to develop in people who have a personal history or family history of eczema, allergies, asthma, or hay fever. What are the signs or symptoms? Symptoms of this condition include:  Dry, scaly skin.  Red, itchy rash.  Itchiness, which can be severe. This may occur before the skin rash. This can make sleeping difficult.  Skin thickening and cracking that can occur over time. How is this diagnosed? This condition is diagnosed based on your symptoms, a medical history, and a physical exam. How is this treated? There is no cure for this condition, but symptoms can usually be controlled. Treatment focuses on:  Controlling the itchiness and scratching. You may be given medicines, such as antihistamines or steroid creams.  Limiting exposure to things that you are sensitive or allergic to (allergens).  Recognizing situations that cause stress and developing a plan to manage stress. If your  atopic dermatitis does not get better with medicines, or if it is all over your body (widespread), a treatment using a specific type of light (phototherapy) may be used. Follow these instructions at home: Skin care   Keep your skin well-moisturized. Doing this seals in moisture and helps to prevent dryness. ? Use unscented lotions that have petroleum in them. ? Avoid lotions that contain alcohol or water. They can dry the skin.  Keep baths or showers short (less than 5 minutes) in warm water. Do not use hot water. ? Use mild, unscented cleansers for bathing. Avoid soap and bubble bath. ? Apply a moisturizer to your skin right after a bath or shower.  Do not apply anything to your skin without checking with your health care provider. General instructions  Dress in clothes made of cotton or cotton blends. Dress lightly because heat increases itchiness.  When washing your clothes, rinse your clothes twice so all of the soap is removed.  Avoid any triggers that can cause a flare-up.  Try to manage your stress.  Keep your fingernails cut short.  Avoid scratching. Scratching makes the rash and itchiness worse. It may also result in a skin infection (impetigo) due to a break in the skin caused by scratching.  Take or apply over-the-counter and prescription medicines only as told by your health care provider.  Keep all follow-up visits as told by your health care provider. This is important.  Do not be around people who have cold sores or fever blisters. If you get the infection, it may cause your atopic dermatitis to worsen. Contact a health   care provider if:  Your itchiness interferes with sleep.  Your rash gets worse or it is not better within one week of starting treatment.  You have a fever.  You have a rash flare-up after having contact with someone who has cold sores or fever blisters. Get help right away if:  You develop pus or soft yellow scabs in the rash area. Summary   This condition causes a red rash and itchy, dry, scaly skin.  Treatment focuses on controlling the itchiness and scratching, limiting exposure to things that you are sensitive or allergic to (allergens), recognizing situations that cause stress, and developing a plan to manage stress.  Keep your skin well-moisturized.  Keep baths or showers shorter than 5 minutes and use warm water. Do not use hot water. This information is not intended to replace advice given to you by your health care provider. Make sure you discuss any questions you have with your health care provider. Document Released: 05/28/2000 Document Revised: 09/19/2018 Document Reviewed: 07/02/2016 Elsevier Patient Education  2020 [employer].    Hypertension, Adult Hypertension is another name for high blood pressure. High blood pressure forces your heart to work harder to pump blood. This can cause problems over time. There are two numbers in a blood pressure reading. There is a top number (systolic) over a bottom number (diastolic). It is best to have a blood pressure that is below 120/80. Healthy choices can help lower your blood pressure, or you may need medicine to help lower it. What are the causes? The cause of this condition is not known. Some conditions may be related to high blood pressure. What increases the risk?  Smoking.  Having type 2 diabetes mellitus, high cholesterol, or both.  Not getting enough exercise or physical activity.  Being overweight.  Having too much fat, sugar, calories, or salt (sodium) in your diet.  Drinking too much alcohol.  Having long-term (chronic) kidney disease.  Having a family history of high blood pressure.  Age. Risk increases with age.  Race. You may be at higher risk if you are African American.  Gender. Men are at higher risk than women before age 36. After age 74, women are at higher risk than men.  Having obstructive sleep apnea.  Stress. What are the  signs or symptoms?  High blood pressure may not cause symptoms. Very high blood pressure (hypertensive crisis) may cause: ? Headache. ? Feelings of worry or nervousness (anxiety). ? Shortness of breath. ? Nosebleed. ? A feeling of being sick to your stomach (nausea). ? Throwing up (vomiting). ? Changes in how you see. ? Very bad chest pain. ? Seizures. How is this treated?  This condition is treated by making healthy lifestyle changes, such as: ? Eating healthy foods. ? Exercising more. ? Drinking less alcohol.  Your health care provider may prescribe medicine if lifestyle changes are not enough to get your blood pressure under control, and if: ? Your top number is above 130. ? Your bottom number is above 80.  Your personal target blood pressure may vary. Follow these instructions at home: Eating and drinking   If told, follow the DASH eating plan. To follow this plan: ? Fill one half of your plate at each meal with fruits and vegetables. ? Fill one fourth of your plate at each meal with whole grains. Whole grains include whole-wheat pasta, brown rice, and whole-grain bread. ? Eat or drink low-fat dairy products, such as skim milk or low-fat yogurt. ? Fill  one fourth of your plate at each meal with low-fat (lean) proteins. Low-fat proteins include fish, chicken without skin, eggs, beans, and tofu. ? Avoid fatty meat, cured and processed meat, or chicken with skin. ? Avoid pre-made or processed food.  Eat less than 1,500 mg of salt each day.  Do not drink alcohol if: ? Your doctor tells you not to drink. ? You are pregnant, may be pregnant, or are planning to become pregnant.  If you drink alcohol: ? Limit how much you use to:  0-1 drink a day for women.  0-2 drinks a day for men. ? Be aware of how much alcohol is in your drink. In the [place] one drink equals one 12 oz bottle of beer (355 mL), one 5 oz glass of wine (148 mL), or one 1 oz glass of hard liquor (44 mL).  Lifestyle   Work with your doctor to stay at a healthy weight or to lose weight. Ask your doctor what the best weight is for you.  Get at least 30 minutes of exercise most days of the week. This may include walking, swimming, or biking.  Get at least 30 minutes of exercise that strengthens your muscles (resistance exercise) at least 3 days a week. This may include lifting weights or doing Pilates.  Do not use any products that contain nicotine or tobacco, such as cigarettes, e-cigarettes, and chewing tobacco. If you need help quitting, ask your doctor.  Check your blood pressure at home as told by your doctor.  Keep all follow-up visits as told by your doctor. This is important. Medicines  Take over-the-counter and prescription medicines only as told by your doctor. Follow directions carefully.  Do not skip doses of blood pressure medicine. The medicine does not work as well if you skip doses. Skipping doses also puts you at risk for problems.  Ask your doctor about side effects or reactions to medicines that you should watch for. Contact a doctor if you:  Think you are having a reaction to the medicine you are taking.  Have headaches that keep coming back (recurring).  Feel dizzy.  Have swelling in your ankles.  Have trouble with your vision. Get help right away if you:  Get a very bad headache.  Start to feel mixed up (confused).  Feel weak or numb.  Feel faint.  Have very bad pain in your: ? Chest. ? Belly (abdomen).  Throw up more than once.  Have trouble breathing. Summary  Hypertension is another name for high blood pressure.  High blood pressure forces your heart to work harder to pump blood.  For most people, a normal blood pressure is less than 120/80.  Making healthy choices can help lower blood pressure. If your blood pressure does not get lower with healthy choices, you may need to take medicine. This information is not intended to replace advice  given to you by your health care provider. Make sure you discuss any questions you have with your health care provider. Document Released: 11/17/2007 Document Revised: 02/08/2018 Document Reviewed: 02/08/2018 Elsevier Patient Education  2020 [employer].   Please do not use triamcinolone on your face. Only use on your body. Use in a small area first as there is a side effect of  hypopigmentation.   Please follow up in 3 months for blood pressure and 2 weeks for your rash if no improvement. If you are still dizzy despite good hydration please follow up.   Dr. Darin Engels

## 2019-03-06 NOTE — Progress Notes (Signed)
Subjective:   Patient ID: Erin Osborn, female    DOB: 09-Nov-1980, 38 y.o.   MRN: [id number]   CC: blood pressure check & rash  HPI:  HYPERTENSION Patient is here for a follow-up blood pressure check. Patient's systolic blood pressure was elevated (198) at work on 8/16 and patient was started on Norvasc 5 mg daily after previous clinic visit on 8/20. She reports taking the medication as prescribed. States that she gets exercise at work as she is on her feet throughout the day. Denies SOB, CP, vision changes, hearing loss, LE edema, nausea/vomiting. Patient has experienced some lightheaded and dizziness when standing from a seated position since starting medication.   RASH Patient states that she has had a pruritic, erythematous rash around her ears, face, and chest for the past year. States that the rash feels "dry" and bleeds with itching or rubbing with towel. She has tried using herbal oils to reduce itchiness and dryness without improvement. Patient has used hydrocortisone cream which provides moderate relief from the itching. She has not used any lotions or moisturizers. No new detergent use, soaps noted. No purulent drainage.    Smoking status reviewed: non-smoker  Review of Systems - per HPI    Objective:  BP 115/70   Pulse 73   SpO2 99%  Vitals and nursing note reviewed  General: well nourished, in no acute distress Cardiac: RRR, clear S1 and S2, no murmurs, rubs, or gallops Respiratory: clear to auscultation bilaterally, no increased work of breathing Extremities: no edema Skin: multiple, erythematous, scaly & pruritic papules on ears, face and chest  Neuro: alert and oriented, no focal deficits  Assessment & Plan:   Hypertension Patient was diagnosed with essential hypertension at recent clinic visit (08/20) and was prescribed Norvasc (5mg , daily) at that time. She presents today for a blood pressure check. Follow-up blood pressure with nursing on 09/03 was  normal (112/62). Blood pressure today is also normal (115/70). Blood pressure appears well-controlled with medication. Patient advised to continue managing hypertension with Norvasc 5 mg, daily. Also counseled to increase fluid intake to improve symptoms of dizziness and lightheadedness as this is likely due to mild orthostatic hypotension secondary to dehydration. Told to return in 3 month for follow-up blood pressure check. Also counseled to contact clinic for follow-up if symptoms of dizziness and lightheadedness persist.   Rash and nonspecific skin eruption Patient presents with erythematous, "dry", pruritic rash of face, ears, and chest for 1 year. On physical exam, scaly, erythematous papules noted on face, chest. Patient has used herbal oils on the rash without improvement, while hydrocortisone has provided mild relief. Given history of herbal oil use on the area (potential irritant) coupled with improvement after hydrocortisone application and the scaly/dry quality of the rash, patient likely has atopic dermatitis. Seborrheic dermatitis also considered but less likely given features of the rash and patient history. Patient counseled to apply moisturizing lotions and emollients to affected areas. Patient provided prescription for triamcinolone to be applied on chest (1 application, twice daily). Advised to start with small amounts and avoid facial application of triamcinolone due to potential hypopigmentation side effect. Advised that she can continue using hydrocortisone on face (1 application, twice daily PRN). Told to follow-up in 2 weeks if no improvement noted.    Erin Osborn, Medical Student   No follow-ups on file.

## 2019-03-06 NOTE — Assessment & Plan Note (Addendum)
Likely atopic dermatitis.  Patient presents with erythematous, "dry", pruritic rash of face, ears, and chest for 1 year. On physical exam, scaly, erythematous papules noted on face, chest. Patient has used herbal oils on the rash without improvement, while hydrocortisone has provided mild relief. Given history of herbal oil use on the area (potential irritant) coupled with improvement after hydrocortisone application and the scaly/dry quality of the rash, patient likely has atopic dermatitis. Seborrheic dermatitis also considered but less likely given features of the rash and patient history. Patient counseled to apply moisturizing lotions and emollients to affected areas. Patient provided prescription for triamcinolone to be applied on chest (1 application, twice daily). Advised to start with small amounts and avoid facial application of triamcinolone due to potential hypopigmentation side effect. Advised that she can continue using hydrocortisone on face (1 application, twice daily PRN).  Side effects of hypopigmentation discussed with patient.  Told to follow-up in 2 weeks if no improvement noted.

## 2019-03-07 NOTE — Progress Notes (Signed)
I was the preceptor available for this visit. 

## 2019-05-05 ENCOUNTER — Other Ambulatory Visit: Payer: Self-pay | Admitting: Family Medicine

## 2019-05-05 DIAGNOSIS — I10 Essential (primary) hypertension: Secondary | ICD-10-CM

## 2019-05-05 NOTE — Telephone Encounter (Signed)
Patient called after hours line stating she has been out of her blood pressure medicine for 1 week. She requested a refill but the refill had not been sent in. She has started developing headaches that are relieved with tylenol but they are starting to get worse. Denies any chest pain or vision changes. Patient unable to check blood pressure until tomorrow morning.She notes she is able to go to the pharmacy right now to pick up her medicine. Refill of her Amlodipine 5mg  QD ordered. Informed patient to seek medical care if no improvement in her headache with tylenol and BP medicine or if she develops chest pain or vision changes. She understands and agrees.

## 2019-07-26 ENCOUNTER — Other Ambulatory Visit: Payer: Self-pay

## 2019-07-26 ENCOUNTER — Telehealth (INDEPENDENT_AMBULATORY_CARE_PROVIDER_SITE_OTHER): Payer: Medicaid Other | Admitting: Family Medicine

## 2019-07-26 DIAGNOSIS — U071 COVID-19: Secondary | ICD-10-CM | POA: Diagnosis not present

## 2019-07-26 MED ORDER — ONDANSETRON HCL 8 MG PO TABS: 8.0000 mg | ORAL_TABLET | Freq: Three times a day (TID) | ORAL | 0 refills | Status: DC | PRN

## 2019-07-26 NOTE — Assessment & Plan Note (Signed)
No fevers or shortness of breath at this time.  Likely clinical course discussed.  Encouraged that she is safe and appropriate to continue her convalescence at home.  Isolate until 2/19.  Wear a mask when interacting with family members. -Encouraged to hydrate well -Tylenol and Motrin as needed -Zofran ordered as needed for nausea -Be assessed for worsening symptoms which would include shortness of breath or chest pain -No work-up needed at this time

## 2019-07-26 NOTE — Progress Notes (Signed)
[place] [facility] Telemedicine Visit  Patient consented to have virtual visit. Method of visit: Video was attempted, but technology challenges prevented patient from using video, so visit was conducted via telephone.  Encounter participants: Patient: Erin Osborn - located at home Provider: Mirian Mo - located at [facility] Others (if applicable): none  Chief Complaint: COVID positive  HPI:  COVID-19 infection She began having symptoms of body aches and sore throat on 2/9.  She tested positive for coronavirus on 2/10.  She is currently excused from work and isolating at home.  She was told that she could not return to work before 2/22.  She currently works at a nursing home.  She continues to have significant body aches and a sore throat.  She had nausea yesterday with one episode of NB/NB emesis.  No significant nausea at present.  She denies fever, shortness of breath.  She lives at home with her 2 sons who are at 90 years old and 74 years old.  Neither of her sons have significant medical conditions.   ROS: per HPI  Pertinent PMHx: Obesity  Exam:   General: Tired appearing 39 year old woman.  No acute distress. Respiratory: Breathing comfortably on room air.  Normal respiratory effort. Cardiac: Noncyanotic appearing  Assessment/Plan:  COVID-19 virus infection No fevers or shortness of breath at this time.  Likely clinical course discussed.  Encouraged that she is safe and appropriate to continue her convalescence at home.  Isolate until 2/19.  Wear a mask when interacting with family members. -Encouraged to hydrate well -Tylenol and Motrin as needed -Zofran ordered as needed for nausea -Be assessed for worsening symptoms which would include shortness of breath or chest pain -No work-up needed at this time    Time spent during visit with patient: 10 minutes

## 2019-08-02 ENCOUNTER — Telehealth: Payer: Medicaid Other | Admitting: Family Medicine

## 2019-08-03 ENCOUNTER — Other Ambulatory Visit: Payer: Self-pay

## 2019-08-03 ENCOUNTER — Encounter: Payer: Self-pay | Admitting: Family Medicine

## 2019-08-03 ENCOUNTER — Telehealth (INDEPENDENT_AMBULATORY_CARE_PROVIDER_SITE_OTHER): Payer: Medicaid Other | Admitting: Family Medicine

## 2019-08-03 DIAGNOSIS — U071 COVID-19: Secondary | ICD-10-CM | POA: Diagnosis not present

## 2019-08-03 MED ORDER — BENZONATATE 200 MG PO CAPS: 200.0000 mg | ORAL_CAPSULE | Freq: Three times a day (TID) | ORAL | 0 refills | Status: DC | PRN

## 2019-08-03 NOTE — Assessment & Plan Note (Addendum)
She technically finishes her isolation today.  She was encouraged to continue to quarantine through 2/26 by her daughter remains in isolation.  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  She was provided Jerilynn Som for her persistent cough was encouraged to continue monitoring her symptoms.  She was reassured that her cough and shortness of breath should improve with time and sound consistent with post Covid symptoms. -Work note provided

## 2019-08-03 NOTE — Progress Notes (Signed)
[place] [facility] Telemedicine Visit  Patient consented to have virtual visit. Method of visit: Telephone  Encounter participants: Patient: Erin Osborn - located at home Provider: Mirian Mo - located at [facility] Others (if applicable): none  Chief Complaint: COVID exposure  HPI:  COVID Mom was found to be positive with Covid on 07/25/2019.  She will be finishing her isolation today (symptoms started 2/9).  Her daughter began experiencing symptoms on 2/16 and tested positive on 2/1 Erin Osborn has generally been improving although she continues to have a cough with mild shortness of breath.  She is planning to return to work tomorrow but her supervisor encouraged her to speak with the physician and encouraged her to continue to quarantine for the duration of her daughter's illness.  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  ROS: per HPI  Pertinent PMHx: Recent Covid infection  Exam:  Respiratory: Breathing comfortably on room air.  Able to complete long sentences without effort  Assessment/Plan:  COVID-19 virus infection She technically finishes her isolation today.  She was encouraged to continue to quarantine through 2/26 by her daughter remains in isolation.  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  She was provided Jerilynn Som for her persistent cough was encouraged to continue monitoring her symptoms.  She was reassured that her cough and shortness of breath should improve with time and sound consistent with post Covid symptoms. -Work note provided    Time spent during visit with patient: 15 minutes

## 2019-08-28 ENCOUNTER — Other Ambulatory Visit: Payer: Self-pay | Admitting: Family Medicine

## 2019-08-28 DIAGNOSIS — I10 Essential (primary) hypertension: Secondary | ICD-10-CM

## 2019-09-12 ENCOUNTER — Ambulatory Visit: Payer: Medicaid Other

## 2019-10-26 ENCOUNTER — Telehealth: Payer: Self-pay

## 2019-10-26 NOTE — Telephone Encounter (Signed)
Patient nurse line reporting constant chest pain when she moves since this morning. Patient reports it feels more like a "muscle pull" verses "anything else." Patient denies SOB, tingling/numbess in arms, or jaw tightness. Patient reports she has been compliant with her bp meds and can not check her blood pressure at this time. Per chart review, looks like patient has had this issue in the past and was resolved with ibuprofen. Patient reports she just took some and this does feel similar. Patient would like to evaluated, however no apts today. Patient stated she would go to [facility]

## 2019-12-06 ENCOUNTER — Other Ambulatory Visit: Payer: Self-pay | Admitting: Family Medicine

## 2019-12-06 ENCOUNTER — Ambulatory Visit: Payer: Medicaid Other | Admitting: Family Medicine

## 2019-12-06 ENCOUNTER — Other Ambulatory Visit: Payer: Self-pay

## 2019-12-06 ENCOUNTER — Encounter: Payer: Self-pay | Admitting: Family Medicine

## 2019-12-06 VITALS — BP 158/100 | HR 78 | Ht 60.0 in | Wt 210.0 lb

## 2019-12-06 DIAGNOSIS — I10 Essential (primary) hypertension: Secondary | ICD-10-CM | POA: Diagnosis not present

## 2019-12-06 DIAGNOSIS — R42 Dizziness and giddiness: Secondary | ICD-10-CM | POA: Diagnosis not present

## 2019-12-06 MED ORDER — CETIRIZINE HCL 10 MG PO TABS: 10.0000 mg | ORAL_TABLET | Freq: Every day | ORAL | 11 refills | Status: DC

## 2019-12-06 MED ORDER — BLOOD PRESSURE MONITOR AUTOMAT DEVI: 1.0000 | Freq: Every day | 0 refills | Status: DC

## 2019-12-06 NOTE — Progress Notes (Signed)
    SUBJECTIVE:   CHIEF COMPLAINT / HPI:   High Blood Pressure  Feels badly a few times a week and gets her BP checked at work. Gets bad headaches and feels dizzy all the time. During her episodes, she sees black spots. Sometimes, she'll get up and walk and suddenly start feeling really dizzy with vision blurriness and she has to stop and get herself back to normal over a few minutes of sitting down. Never has fallen in the past. No family hx of heart disease or stroke.  Was taking amlodipine for a few months and ran out two days ago. When she was taking the medicaiton, she was still having these episodes but not as often. No weakness or tingling in extremities.  No obvious facial droop or unilateral neurologic concerns. She reports that she is tired a lot.  Has had three miscarrigaes. No family history of blood clots or clotting/bleeding disorders.  Patient reports that she has had a pruritic rash on her face all of the time.  She does have a history of eczema and has been using hydrocortisone cream on her face for months daily after showers.  PERTINENT  PMH / PSH: obesity, GERD, HTN, chronic headaches   OBJECTIVE:   BP (!) 158/100   Pulse 78   Ht 5' (1.524 m)   Wt 210 lb (95.3 kg)   SpO2 99%   BMI 41.01 kg/m   General: NAD, non-toxic, well-appearing, sitting comfortably in chair    HEENT: [place]/AT. PERRLA. EOMI.  Cardiovascular: RRR, normal S1, S2. B/L 2+ RP. No BLEE Respiratory: CTAB. No IWOB.  Abdomen: + BS. NT, ND, soft to palpation.  Extremities: Warm and well perfused. Moving spontaneously.  Integumentary: dry skin on forehead and cheeks. No erythema or trauma appreciated.  Neuro: A & O x4. CN grossly intact. No FND   ASSESSMENT/PLAN:   Hypertension Patient presenting after having elevated blood pressure at work that was measured to be 177/123.  On arrival, her blood pressure is 170/110 in the office.  Patient reports she ran out of amlodipine a few days ago.  She does not  check her blood pressures at home.  She denies any red flags concerning for stroke.  We will send refill for 5 mg amlodipine to patient's pharmacy.  Additionally, have sent a blood pressure monitor device to the pharmacy.  Will obtain labs to rule out any endorgan damage and other possible causes of hypertension.  Patient will likely be increased to amlodipine 10 mg and likely other medications.  She is to follow-up in 1 to 2 weeks for blood pressure check in.  Dizziness Patient presenting with dizziness and some visual disturbances that happen intermittently throughout the day.  She reports that she does not have these episodes as often when she was taking her amlodipine.  It is unlikely that her dizziness is due to her hypertension, but will obtain labs to rule out other causes.  Patient denies any falls and I do not believe she has any acute emergencies.  Will have patient restart on amlodipine and monitor dizzy episodes.   Melene Plan, MD [facility]

## 2019-12-06 NOTE — Patient Instructions (Addendum)
Blood pressure  Sending a blood pressure cuff to the phrarmacy. Start measuring your Bps twice daily. Once in the morning before eating anything. Once at night before going to bed.  Take the amlodipine 5 mg nightly   If you start having very severe headache, changes in vision, facial droop, one sided weakness/numbness that don't go away after a few minutes, please go straight to the Emergency Department.   Eczema Stop using cortisone cream on your face  Start using thick creams or Vaseline after showering to help hydrate your skin. (Jergens Ultrahealing) Avoid hot showers  Start Zyrtec  If the itching is really bad, you can use benadryl to help prevent   Follow up in one week

## 2019-12-07 LAB — COMPREHENSIVE METABOLIC PANEL
ALT: 12 IU/L (ref 0–32)
AST: 10 IU/L (ref 0–40)
Albumin/Globulin Ratio: 1.3 (ref 1.2–2.2)
Albumin: 4.1 g/dL (ref 3.8–4.8)
Alkaline Phosphatase: 124 IU/L — ABNORMAL HIGH (ref 48–121)
BUN/Creatinine Ratio: 13 (ref 9–23)
BUN: 8 mg/dL (ref 6–20)
Bilirubin Total: 0.4 mg/dL (ref 0.0–1.2)
CO2: 23 mmol/L (ref 20–29)
Calcium: 9.1 mg/dL (ref 8.7–10.2)
Chloride: 105 mmol/L (ref 96–106)
Creatinine, Ser: 0.64 mg/dL (ref 0.57–1.00)
GFR calc Af Amer: 131 mL/min/{1.73_m2} (ref >59)
GFR calc non Af Amer: 114 mL/min/{1.73_m2} (ref >59)
Globulin, Total: 3.1 g/dL (ref 1.5–4.5)
Glucose: 82 mg/dL (ref 65–99)
Potassium: 4.1 mmol/L (ref 3.5–5.2)
Sodium: 141 mmol/L (ref 134–144)
Total Protein: 7.2 g/dL (ref 6.0–8.5)

## 2019-12-07 LAB — CBC
Hematocrit: 38.2 % (ref 34.0–46.6)
Hemoglobin: 13 g/dL (ref 11.1–15.9)
MCH: 29.5 pg (ref 26.6–33.0)
MCHC: 34 g/dL (ref 31.5–35.7)
MCV: 87 fL (ref 79–97)
Platelets: 298 10*3/uL (ref 150–450)
RBC: 4.41 x10E6/uL (ref 3.77–5.28)
RDW: 12.9 % (ref 11.7–15.4)
WBC: 8.1 10*3/uL (ref 3.4–10.8)

## 2019-12-07 LAB — TSH: TSH: 1.05 u[IU]/mL (ref 0.450–4.500)

## 2019-12-10 ENCOUNTER — Encounter: Payer: Self-pay | Admitting: Family Medicine

## 2019-12-10 DIAGNOSIS — R42 Dizziness and giddiness: Secondary | ICD-10-CM | POA: Insufficient documentation

## 2019-12-10 NOTE — Assessment & Plan Note (Signed)
Patient presenting with dizziness and some visual disturbances that happen intermittently throughout the day.  She reports that she does not have these episodes as often when she was taking her amlodipine.  It is unlikely that her dizziness is due to her hypertension, but will obtain labs to rule out other causes.  Patient denies any falls and I do not believe she has any acute emergencies.  Will have patient restart on amlodipine and monitor dizzy episodes.

## 2019-12-10 NOTE — Assessment & Plan Note (Addendum)
Patient presenting after having elevated blood pressure at work that was measured to be 177/123.  On arrival, her blood pressure is 170/110 in the office.  Patient reports she ran out of amlodipine a few days ago.  She does not check her blood pressures at home.  She denies any red flags concerning for stroke.  We will send refill for 5 mg amlodipine to patient's pharmacy.  Additionally, have sent a blood pressure monitor device to the pharmacy.  Will obtain labs to rule out any endorgan damage and other possible causes of hypertension.  Patient will likely be increased to amlodipine 10 mg and likely other medications.  She is to follow-up in 1 to 2 weeks for blood pressure check in.

## 2019-12-19 ENCOUNTER — Telehealth: Payer: Self-pay

## 2019-12-19 DIAGNOSIS — L309 Dermatitis, unspecified: Secondary | ICD-10-CM

## 2019-12-19 NOTE — Telephone Encounter (Signed)
Patient calls nurse line reporting no relief from medications given at recent visit for eczema. Patient reports she has been compliant with meds, however is still dealing with a lot of itching. Patient would like to be referred to dermatology. Patient is also requesting something different to try on her skin in the meantime, as she was told to stop using hydrocortisone cream. Will forward to provider who saw patient recently.

## 2019-12-21 NOTE — Addendum Note (Signed)
Addended by: Melene Plan on: 12/21/2019 04:53 PM   Modules accepted: Orders

## 2019-12-21 NOTE — Telephone Encounter (Signed)
Referral to dermatology placed.  Patient can continue using low dose hydrocortisone cream until she sees the dermatologist if that is the only thing that works for her and she has failed other recommendations at last visit.

## 2020-03-06 ENCOUNTER — Other Ambulatory Visit: Payer: Self-pay

## 2020-03-06 DIAGNOSIS — I10 Essential (primary) hypertension: Secondary | ICD-10-CM

## 2020-03-07 MED ORDER — AMLODIPINE BESYLATE 5 MG PO TABS: ORAL_TABLET | ORAL | 0 refills | Status: DC

## 2020-04-20 ENCOUNTER — Telehealth: Payer: Self-pay | Admitting: Family Medicine

## 2020-04-20 ENCOUNTER — Encounter (HOSPITAL_COMMUNITY): Payer: Self-pay | Admitting: Emergency Medicine

## 2020-04-20 ENCOUNTER — Other Ambulatory Visit: Payer: Self-pay

## 2020-04-20 ENCOUNTER — Ambulatory Visit (HOSPITAL_COMMUNITY)
Admission: EM | Admit: 2020-04-20 | Discharge: 2020-04-20 | Disposition: A | Discharge status: Home / Self Care | Payer: Medicaid Other | Attending: Urgent Care | Admitting: Urgent Care

## 2020-04-20 DIAGNOSIS — M79622 Pain in left upper arm: Secondary | ICD-10-CM | POA: Diagnosis not present

## 2020-04-20 DIAGNOSIS — L089 Local infection of the skin and subcutaneous tissue, unspecified: Secondary | ICD-10-CM | POA: Diagnosis not present

## 2020-04-20 DIAGNOSIS — L723 Sebaceous cyst: Secondary | ICD-10-CM | POA: Diagnosis not present

## 2020-04-20 MED ORDER — NAPROXEN 375 MG PO TABS: 375.0000 mg | ORAL_TABLET | Freq: Two times a day (BID) | ORAL | 0 refills | Status: DC

## 2020-04-20 MED ORDER — SULFAMETHOXAZOLE-TRIMETHOPRIM 800-160 MG PO TABS: 1.0000 | ORAL_TABLET | Freq: Two times a day (BID) | ORAL | 0 refills | Status: DC

## 2020-04-20 NOTE — Telephone Encounter (Signed)
Patient calls after-hours line regarding abscess under her arm. She reports that she went to urgent care on Friday and that they did not drain it but gave her some antibiotic cream. She reports that over the weekend the pain is gotten worse and she "wants to cut it with a razor blade". I asked her to please not do this and recommend she see a provider either at urgent care, in the emergency department, or in our clinic. We look for an appointment in our clinic tomorrow but 1 is not available. Tomorrow is the patient's only available day off so she reports that she will either go to an urgent care or the emergency department for assessment and possible drainage. Strongly encouraged the patient to seek medical attention now and patient is agreeable.

## 2020-04-20 NOTE — Discharge Instructions (Signed)
Please change your dressing 2-3 times daily. Do not apply any ointments or creams. Each time you change your dressing, make sure you clean gently around the perimeter of the wound with gentle soap and warm water. Pat your wound dry and let it air out if possible before reapplying another dressing.   

## 2020-04-20 NOTE — ED Provider Notes (Signed)
[place]   MRN: [id number] DOB: 08-12-1980  Subjective:   Erin Osborn is a 39 y.o. female presenting for 1 week history of persistent and recurrent worsening right axillary pain with swelling.  Patient states that she has previously had an incision and drainage done here before.  She does see a dermatologist but has not seen them for this.  Denies any spontaneous drainage of pus or bleeding.  Denies fevers, nausea, vomiting, chest pain, difficulty breathing.  No current facility-administered medications for this encounter.  Current Outpatient Medications:  .  amLODipine (NORVASC) 5 MG tablet, TAKE 1 TABLET BY MOUTH EVERYDAY AT BEDTIME, Disp: 90 tablet, Rfl: 0 .  Blood Pressure Monitoring (BLOOD PRESSURE MONITOR AUTOMAT) DEVI, 1 kit by Does not apply route daily. Please call the office if you have any issues obtaining this monitor., Disp: 1 each, Rfl: 0 .  cetirizine (ZYRTEC) 10 MG tablet, Take 1 tablet (10 mg total) by mouth daily., Disp: 30 tablet, Rfl: 11 .  hydrocortisone cream 1 %, Apply 1 application topically 2 (two) times daily as needed (eczema)., Disp: , Rfl:  .  medroxyPROGESTERone (DEPO-PROVERA) 150 MG/ML injection, Present to office next week with syringe for injection, Disp: 1 mL, Rfl: 3   No Known Allergies  Past Medical History:  Diagnosis Date  . Migraines    otc med prn  . SVD (spontaneous vaginal delivery)    x 2     Past Surgical History:  Procedure Laterality Date  . CHOLECYSTECTOMY     Gallstones removed-Around 39 years of age  . DILATION AND CURETTAGE OF UTERUS N/A 10/18/2014   Procedure: SUCTION DILATATION AND CURETTAGE;  Surgeon: Woodroe Mode, MD;  Location: [facility];  Service: Gynecology;  Laterality: N/A;  . DILATION AND EVACUATION N/A 11/12/2016   Procedure: DILATATION AND EVACUATION;  Surgeon: Allyn Kenner, DO;  Location: [facility];  Service: Gynecology;  Laterality: N/A;    Family History  Problem Relation Age of Onset  .  Hypertension Mother   . Diabetes Mother   . Hypertension Maternal Grandmother   . Diabetes Maternal Grandmother   . Alcohol abuse Father   . Drug abuse Father   . Cancer Father   . Hypertension Sister   . Drug abuse Brother   . Alcohol abuse Brother   . Acute myelogenous leukemia Neg Hx     Social History   Tobacco Use  . Smoking status: Never Smoker  . Smokeless tobacco: Never Used  Substance Use Topics  . Alcohol use: No  . Drug use: No    ROS   Objective:   Vitals: BP 130/83 (BP Location: Right Arm)   Pulse 81   Temp 99.1 F (37.3 C) (Oral)   Resp 19   SpO2 98%   Physical Exam Constitutional:      General: She is not in acute distress.    Appearance: Normal appearance. She is well-developed. She is not ill-appearing, toxic-appearing or diaphoretic.  HENT:     Head: Normocephalic and atraumatic.     Nose: Nose normal.     Mouth/Throat:     Mouth: Mucous membranes are moist.     Pharynx: Oropharynx is clear.  Eyes:     General: No scleral icterus.    Extraocular Movements: Extraocular movements intact.     Pupils: Pupils are equal, round, and reactive to light.  Cardiovascular:     Rate and Rhythm: Normal rate.  Pulmonary:     Effort:  Pulmonary effort is normal.  Skin:    General: Skin is warm and dry.       Neurological:     General: No focal deficit present.     Mental Status: She is alert and oriented to person, place, and time.  Psychiatric:        Mood and Affect: Mood normal.        Behavior: Behavior normal.     PROCEDURE NOTE: I&D of Abscess Verbal consent obtained. Local anesthesia with 2cc of 1% lidocaine with epinephrine. Site cleansed with Betadine. Incision of 1cm was made using a 11 blade, discharge of mixture of sebaceous material including dissected sac, serosanguineous material.  Wound cavity was explored and loculations loosened with curved hemostats.  No packing placed. Cleansed and dressed.   Assessment and Plan :   PDMP  not reviewed this encounter.  1. Infected sebaceous cyst   2. Axillary pain, left     Successful incision and drainage.  Counseled that she should follow-up with her dermatologist for recheck given that this is the second incision and drainage for the same problem.  Start Bactrim, naproxen for pain and inflammation.  Reviewed wound care. Counseled patient on potential for adverse effects with medications prescribed/recommended today, ER and return-to-clinic precautions discussed, patient verbalized understanding.    Jaynee Eagles, [place] 04/20/20 1741

## 2020-04-20 NOTE — ED Triage Notes (Signed)
Pt presents with boil under left arm xs 1 week.

## 2020-04-23 ENCOUNTER — Other Ambulatory Visit: Payer: Self-pay

## 2020-04-23 ENCOUNTER — Ambulatory Visit: Payer: Medicaid Other | Admitting: Family Medicine

## 2020-04-23 VITALS — BP 122/80 | HR 70 | Wt 209.0 lb

## 2020-04-23 DIAGNOSIS — L02419 Cutaneous abscess of limb, unspecified: Secondary | ICD-10-CM | POA: Diagnosis present

## 2020-04-23 MED ORDER — TRAMADOL HCL 50 MG PO TABS: 50.0000 mg | ORAL_TABLET | Freq: Four times a day (QID) | ORAL | 0 refills | Status: AC | PRN

## 2020-04-23 NOTE — Patient Instructions (Signed)
It was a pleasure to see you today!  Thank you for choosing [facility] for your primary care.  Erin Osborn was seen for right arm pit pain  Our plans for today were:  Tylenol ES 650mg  every 6 hours   Naproxen 500mg  twice a day   Tramadol 50mg  ONLY AS NEEDED FOR BREAK THROUGH PAIN every 6 hours  Warm compresses throughout the day  Return if develops fevers or chills, worsening pain that's not improving  Continue the bactrim until completed  Follow up with derm next week  Best Wishes,   , DO

## 2020-04-23 NOTE — Progress Notes (Signed)
   Subjective:   Patient ID: Erin Osborn    DOB: Aug 06, 1980, 39 y.o. female   MRN: [id number]  Erin Osborn is a 39 y.o. female with a history of hemorrhoids, hypertension, allergic rhinitis, GERD, allergic conjunctivitis, chronic headaches, history of COVID-19, depression, obesity here for pain from procedure  Left axillary Pain after Procedure: She was seen in urgent care on 11/7 for persistent and recurrent worsening right axillary pain with swelling.  She was noted to have an infected sebaceous cyst.  She had previously had an incision and drainage done.  Per chart review she does follow with dermatology but has not seen them in some time.  They completed an additional incision and drainage at that time in UC without complication. They were able to dissect the sac. She was started on Bactrim and given naproxen for pain. She notes that she saw her dermatologist on Tuesday who noted it was still infected and acute infection needed to be treated prior to definitive treatment. She is supposed to follow up next week once she completes the antibiotics.  She returns today if continued pain in her left axilla without much improvement. She wants to make sure it's not getting any worse. She denies any fever or chills. Notes some drainage last night that was malodorous and yellow-red.   Review of Systems:  Per HPI.   Objective:   BP 122/80   Pulse 70   Wt 209 lb (94.8 kg)   SpO2 99%   BMI 40.82 kg/m  Vitals and nursing note reviewed.  General: pleasant middle aged woman, sitting comfortably in exam chair, well nourished, well developed, in no acute distress with non-toxic appearance Resp: breathing comfortably on room air, speaking in full sentences Skin: warm, dry, left axilla with small opening with intermittent serosanguinous draining, surrounding induration with deep painful cystic structure underlying.  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 Patient currently hemodynamically stable. She has continued pain and serosanguinous drainage from site. Deep painful cystic structure appreciated on exam. Do not feel I&D is safe given depth. Would recommend focusing treatment on continued antibiotics and pain control. - Continue course of antibiotics - Scheduled OTC tylenol 325-650mg  q6h  - continue Naproxen 500mg  BID  + Tramadol 50mg  q6 hours PRN for severe break through pain (caution of use discussed) - warm compresses - Per Derm, avoid creams - Follow up with derm next week as scheduled. RTC if worsening pain or discharge or develops fever or chills   No orders of the defined types were placed in this encounter.  Meds ordered this encounter  Medications  . traMADol (ULTRAM) 50 MG tablet    Sig: Take 1 tablet (50 mg total) by mouth every 6 (six) hours as needed for up to 3 days.    Dispense:  12 tablet    Refill:  0    , DO PGY-3, [facility] 04/25/2020 1:03 PM

## 2020-04-25 DIAGNOSIS — L02419 Cutaneous abscess of limb, unspecified: Secondary | ICD-10-CM | POA: Insufficient documentation

## 2020-04-25 NOTE — Assessment & Plan Note (Addendum)
Acute on chronic. Recurrent. Patient currently hemodynamically stable. She has continued pain and serosanguinous drainage from site. Deep painful cystic structure appreciated on exam. Do not feel I&D is safe given depth. Would recommend focusing treatment on continued antibiotics and pain control. - Continue course of antibiotics - Scheduled OTC tylenol 325-650mg  q6h  - continue Naproxen 500mg  BID  + Tramadol 50mg  q6 hours PRN for severe break through pain (caution of use discussed) - warm compresses - Per Derm, avoid creams - Follow up with derm next week as scheduled. RTC if worsening pain or discharge or develops fever or chills

## 2020-06-27 ENCOUNTER — Ambulatory Visit (HOSPITAL_COMMUNITY)
Admission: EM | Admit: 2020-06-27 | Discharge: 2020-06-27 | Disposition: A | Discharge status: Home / Self Care | Payer: Medicaid Other | Attending: Emergency Medicine | Admitting: Emergency Medicine

## 2020-06-27 ENCOUNTER — Encounter (HOSPITAL_COMMUNITY): Payer: Self-pay

## 2020-06-27 DIAGNOSIS — Z20822 Contact with and (suspected) exposure to covid-19: Secondary | ICD-10-CM | POA: Insufficient documentation

## 2020-06-27 DIAGNOSIS — J069 Acute upper respiratory infection, unspecified: Secondary | ICD-10-CM | POA: Insufficient documentation

## 2020-06-27 DIAGNOSIS — J029 Acute pharyngitis, unspecified: Secondary | ICD-10-CM | POA: Insufficient documentation

## 2020-06-27 LAB — SARS CORONAVIRUS 2 (TAT 6-24 HRS): SARS Coronavirus 2: NEGATIVE

## 2020-06-27 MED ORDER — BENZONATATE 100 MG PO CAPS: 100.0000 mg | ORAL_CAPSULE | Freq: Three times a day (TID) | ORAL | 0 refills | Status: DC

## 2020-06-27 MED ORDER — CEPACOL SORE THROAT 15-3.6 MG MT LOZG: 1.0000 | LOZENGE | Freq: Four times a day (QID) | OROMUCOSAL | 0 refills | Status: DC | PRN

## 2020-06-27 NOTE — Discharge Instructions (Signed)
  You may take 500mg acetaminophen every 4-6 hours or in combination with ibuprofen 400-600mg every 6-8 hours as needed for pain, inflammation, and fever.  Be sure to well hydrated with clear liquids and get at least 8 hours of sleep at night, preferably more while sick.   Please follow up with family medicine in 1 week if needed.   

## 2020-06-27 NOTE — ED Triage Notes (Signed)
Pt c/o sore throat, cough and nasal congestion x 1 week. Pt states she has taken Aleve, Tylenol, Robitussin and has had no relief. Pt states she felt nauseas and was vomiting 3 days ago. Pt denies stomach pain, SOB, and chest pain.

## 2020-06-27 NOTE — ED Provider Notes (Signed)
[place]    CSN: [id number] Arrival date & time: 06/27/20  6073      History   Chief Complaint Chief Complaint  Patient presents with  . Sore Throat  . Cough  . Nasal Congestion  . Emesis  . Nausea    HPI Charish R Stern is a 40 y.o. female.   HPI  Ferrah R Screws is a 40 y.o. female presenting to UC with c/o 1 week of cough, congestion, sore throat, and generalized HA. She has taken Aleve, TYlenol and Robitussin with mild relief. Her husband and 19yo daughter also at UC to be seen for similar symptoms. Her PCP would not see her.  Denies fever. She did have nausea and 1 episode of vomiting the 3 days ago but none since. She received her last COVID vaccine in April 2021.  Past Medical History:  Diagnosis Date  . Migraines    otc med prn  . SVD (spontaneous vaginal delivery)    x 2    Patient Active Problem List   Diagnosis Date Noted  . Axillary abscess 04/25/2020  . Dizziness 12/10/2019  . COVID-19 virus infection 07/26/2019  . Hypertension 02/01/2019  . Chest wall pain 06/03/2017  . Allergic conjunctivitis 12/12/2015  . Elevated glucose tolerance test 04/28/2015  . Rash and nonspecific skin eruption 10/30/2012  . Blepharoconjunctivitis 03/30/2011  . Chronic headaches 10/08/2010  . ALLERGIC RHINITIS 10/31/2008  . OBESITY 08/02/2008  . GERD 06/21/2008  . DEPRESSIVE DISORDER, NOS 08/11/2006  . Hemorrhoids 08/11/2006    Past Surgical History:  Procedure Laterality Date  . CHOLECYSTECTOMY     Gallstones removed-Around 40 years of age  . DILATION AND CURETTAGE OF UTERUS N/A 10/18/2014   Procedure: SUCTION DILATATION AND CURETTAGE;  Surgeon: Woodroe Mode, MD;  Location: [facility];  Service: Gynecology;  Laterality: N/A;  . DILATION AND EVACUATION N/A 11/12/2016   Procedure: DILATATION AND EVACUATION;  Surgeon: Allyn Kenner, DO;  Location: [facility];  Service: Gynecology;  Laterality: N/A;    OB History   This patient's OB History needs to be  verified. Open OB History to review and resolve any issues.  Gravida  5   Para  2   Term  2   Preterm      AB  3   Living  2     SAB  3   IAB      Ectopic      Multiple  0   Live Births  2        Obstetric Comments  D & C  After the last two miscarriages.  Approximate dates for the last two miscarriages.          Home Medications    Prior to Admission medications   Medication Sig Start Date End Date Taking? Authorizing Provider  Benzocaine-Menthol (CEPACOL SORE THROAT) 15-3.6 MG LOZG Use as directed 1 lozenge in the mouth or throat 4 (four) times daily as needed (sore throat). 06/27/20  Yes Starr Urias O, PA-C  benzonatate (TESSALON) 100 MG capsule Take 1 capsule (100 mg total) by mouth every 8 (eight) hours. 06/27/20  Yes Lashunta Frieden O, PA-C  amLODipine (NORVASC) 5 MG tablet TAKE 1 TABLET BY MOUTH EVERYDAY AT BEDTIME 03/07/20   Autry-Lott, Naaman Plummer, DO  Blood Pressure Monitoring (BLOOD PRESSURE MONITOR AUTOMAT) DEVI 1 kit by Does not apply route daily. Please call the office if you have any issues obtaining this monitor. 12/06/19   Wilber Oliphant, MD  cetirizine (ZYRTEC) 10 MG tablet Take 1 tablet (10 mg total) by mouth daily. 12/06/19   Wilber Oliphant, MD  hydrocortisone cream 1 % Apply 1 application topically 2 (two) times daily as needed (eczema).    [provider]  medroxyPROGESTERone (DEPO-PROVERA) 150 MG/ML injection Present to office next week with syringe for injection 11/12/16   Allyn Kenner, DO  naproxen (NAPROSYN) 375 MG tablet Take 1 tablet (375 mg total) by mouth 2 (two) times daily with a meal. 04/20/20   Jaynee Eagles, PA-C  sulfamethoxazole-trimethoprim (BACTRIM DS) 800-160 MG tablet Take 1 tablet by mouth 2 (two) times daily. 04/20/20   Jaynee Eagles, PA-C    Family History Family History  Problem Relation Age of Onset  . Hypertension Mother   . Diabetes Mother   . Hypertension Maternal Grandmother   . Diabetes Maternal Grandmother   . Alcohol  abuse Father   . Drug abuse Father   . Cancer Father   . Hypertension Sister   . Drug abuse Brother   . Alcohol abuse Brother   . Acute myelogenous leukemia Neg Hx     Social History Social History   Tobacco Use  . Smoking status: Never Smoker  . Smokeless tobacco: Never Used  Substance Use Topics  . Alcohol use: No  . Drug use: No     Allergies   Patient has no known allergies.   Review of Systems Review of Systems  Constitutional: Negative for chills and fever.  HENT: Positive for congestion and sore throat. Negative for ear pain, trouble swallowing and voice change.   Respiratory: Positive for cough. Negative for shortness of breath.   Cardiovascular: Negative for chest pain and palpitations.  Gastrointestinal: Negative for abdominal pain, diarrhea, nausea and vomiting.  Musculoskeletal: Negative for arthralgias, back pain and myalgias.  Skin: Negative for rash.  Neurological: Positive for headaches. Negative for dizziness and light-headedness.  All other systems reviewed and are negative.    Physical Exam Triage Vital Signs ED Triage Vitals  Enc Vitals Group     BP 06/27/20 0824 (!) 125/96     Pulse Rate 06/27/20 0824 94     Resp 06/27/20 0824 18     Temp 06/27/20 0824 98 F (36.7 C)     Temp Source 06/27/20 0824 Oral     SpO2 06/27/20 0824 100 %     Weight --      Height --      Head Circumference --      Peak Flow --      Pain Score 06/27/20 0822 8     Pain Loc --      Pain Edu? --      Excl. in [place]? --    No data found.  Updated Vital Signs BP (!) 125/96 (BP Location: Left Arm)   Pulse 94   Temp 98 F (36.7 C) (Oral)   Resp 18   SpO2 100%   Visual Acuity Right Eye Distance:   Left Eye Distance:   Bilateral Distance:    Right Eye Near:   Left Eye Near:    Bilateral Near:     Physical Exam Vitals and nursing note reviewed.  Constitutional:      General: She is not in acute distress.    Appearance: She is well-developed and  well-nourished. She is not ill-appearing, toxic-appearing or diaphoretic.  HENT:     Head: Normocephalic and atraumatic.     Right Ear: Tympanic membrane and ear canal normal.  Left Ear: Tympanic membrane and ear canal normal.     Nose: Nose normal.     Mouth/Throat:     Lips: Pink.     Mouth: Mucous membranes are moist.     Pharynx: Oropharynx is clear. Uvula midline. No pharyngeal swelling, oropharyngeal exudate, posterior oropharyngeal erythema or uvula swelling.  Eyes:     Extraocular Movements: EOM normal.  Cardiovascular:     Rate and Rhythm: Normal rate and regular rhythm.  Pulmonary:     Effort: Pulmonary effort is normal. No respiratory distress.     Breath sounds: Normal breath sounds. No stridor. No wheezing, rhonchi or rales.  Musculoskeletal:        General: Normal range of motion.     Cervical back: Normal range of motion and neck supple.  Lymphadenopathy:     Cervical: No cervical adenopathy.  Skin:    General: Skin is warm and dry.  Neurological:     Mental Status: She is alert and oriented to person, place, and time.  Psychiatric:        Mood and Affect: Mood and affect normal.        Behavior: Behavior normal.      UC Treatments / Results  Labs (all labs ordered are listed, but only abnormal results are displayed) Labs Reviewed  SARS CORONAVIRUS 2 (TAT 6-24 HRS)    EKG   Radiology No results found.  Procedures Procedures (including critical care time)  Medications Ordered in UC Medications - No data to display  Initial Impression / Assessment and Plan / UC Course  I have reviewed the triage vital signs and the nursing notes.  Pertinent labs & imaging results that were available during my care of the patient were reviewed by me and considered in my medical decision making (see chart for details).     No evidence of bacterial infection at this time. COVID pending Encouraged symptomatic tx F/u with PCP as needed AVS given  Final  Clinical Impressions(s) / UC Diagnoses   Final diagnoses:  Upper respiratory tract infection, unspecified type  Sore throat     Discharge Instructions      You may take 517m acetaminophen every 4-6 hours or in combination with ibuprofen 400-6046mevery 6-8 hours as needed for pain, inflammation, and fever.  Be sure to well hydrated with clear liquids and get at least 8 hours of sleep at night, preferably more while sick.   Please follow up with family medicine in 1 week if needed.     ED Prescriptions    Medication Sig Dispense Auth. Provider   benzonatate (TESSALON) 100 MG capsule Take 1 capsule (100 mg total) by mouth every 8 (eight) hours. 21 capsule Mariano Doshi O, PA-C   Benzocaine-Menthol (CEPACOL SORE THROAT) 15-3.6 MG LOZG Use as directed 1 lozenge in the mouth or throat 4 (four) times daily as needed (sore throat). 16 lozenge [place]Noe Gens[place]   PDMP not reviewed this encounter.   PhNoe GensPA[place]ont1/14/22 [phone]

## 2020-06-28 ENCOUNTER — Encounter: Payer: Self-pay | Admitting: Student in an Organized Health Care Education/Training Program

## 2020-06-28 ENCOUNTER — Other Ambulatory Visit: Payer: Self-pay | Admitting: Student in an Organized Health Care Education/Training Program

## 2020-06-28 NOTE — Progress Notes (Signed)
**  After Hours/ Emergency Line Call*  S: patient calls for suggestions on how to feel better. She saw UC yesterday and found to have URI, COVID negative. Biggest complaint today is sore throat. Denies fever.   O: resp: raspy voice but no conversational dyspnea.  General: Has good reason and mentation   A/P: Patient has viral URI.  Provided her with precautions for return to ED - recommended to keep good PO hydration - sent mychart message with other home remedies to treat symptoms - f/u in clinic if not improving in a few days

## 2020-06-30 ENCOUNTER — Encounter: Payer: Self-pay | Admitting: Family Medicine

## 2020-07-04 ENCOUNTER — Ambulatory Visit: Payer: Medicaid Other

## 2020-08-09 IMAGING — DX CHEST - 2 VIEW
2 series · 2 of 2 positions shown · non-contrast
Comparison: 09/14/2015

CLINICAL DATA: Chest fluttering and shortness of breath for 3 days.

EXAM:
CHEST - 2 VIEW

[chest pa]
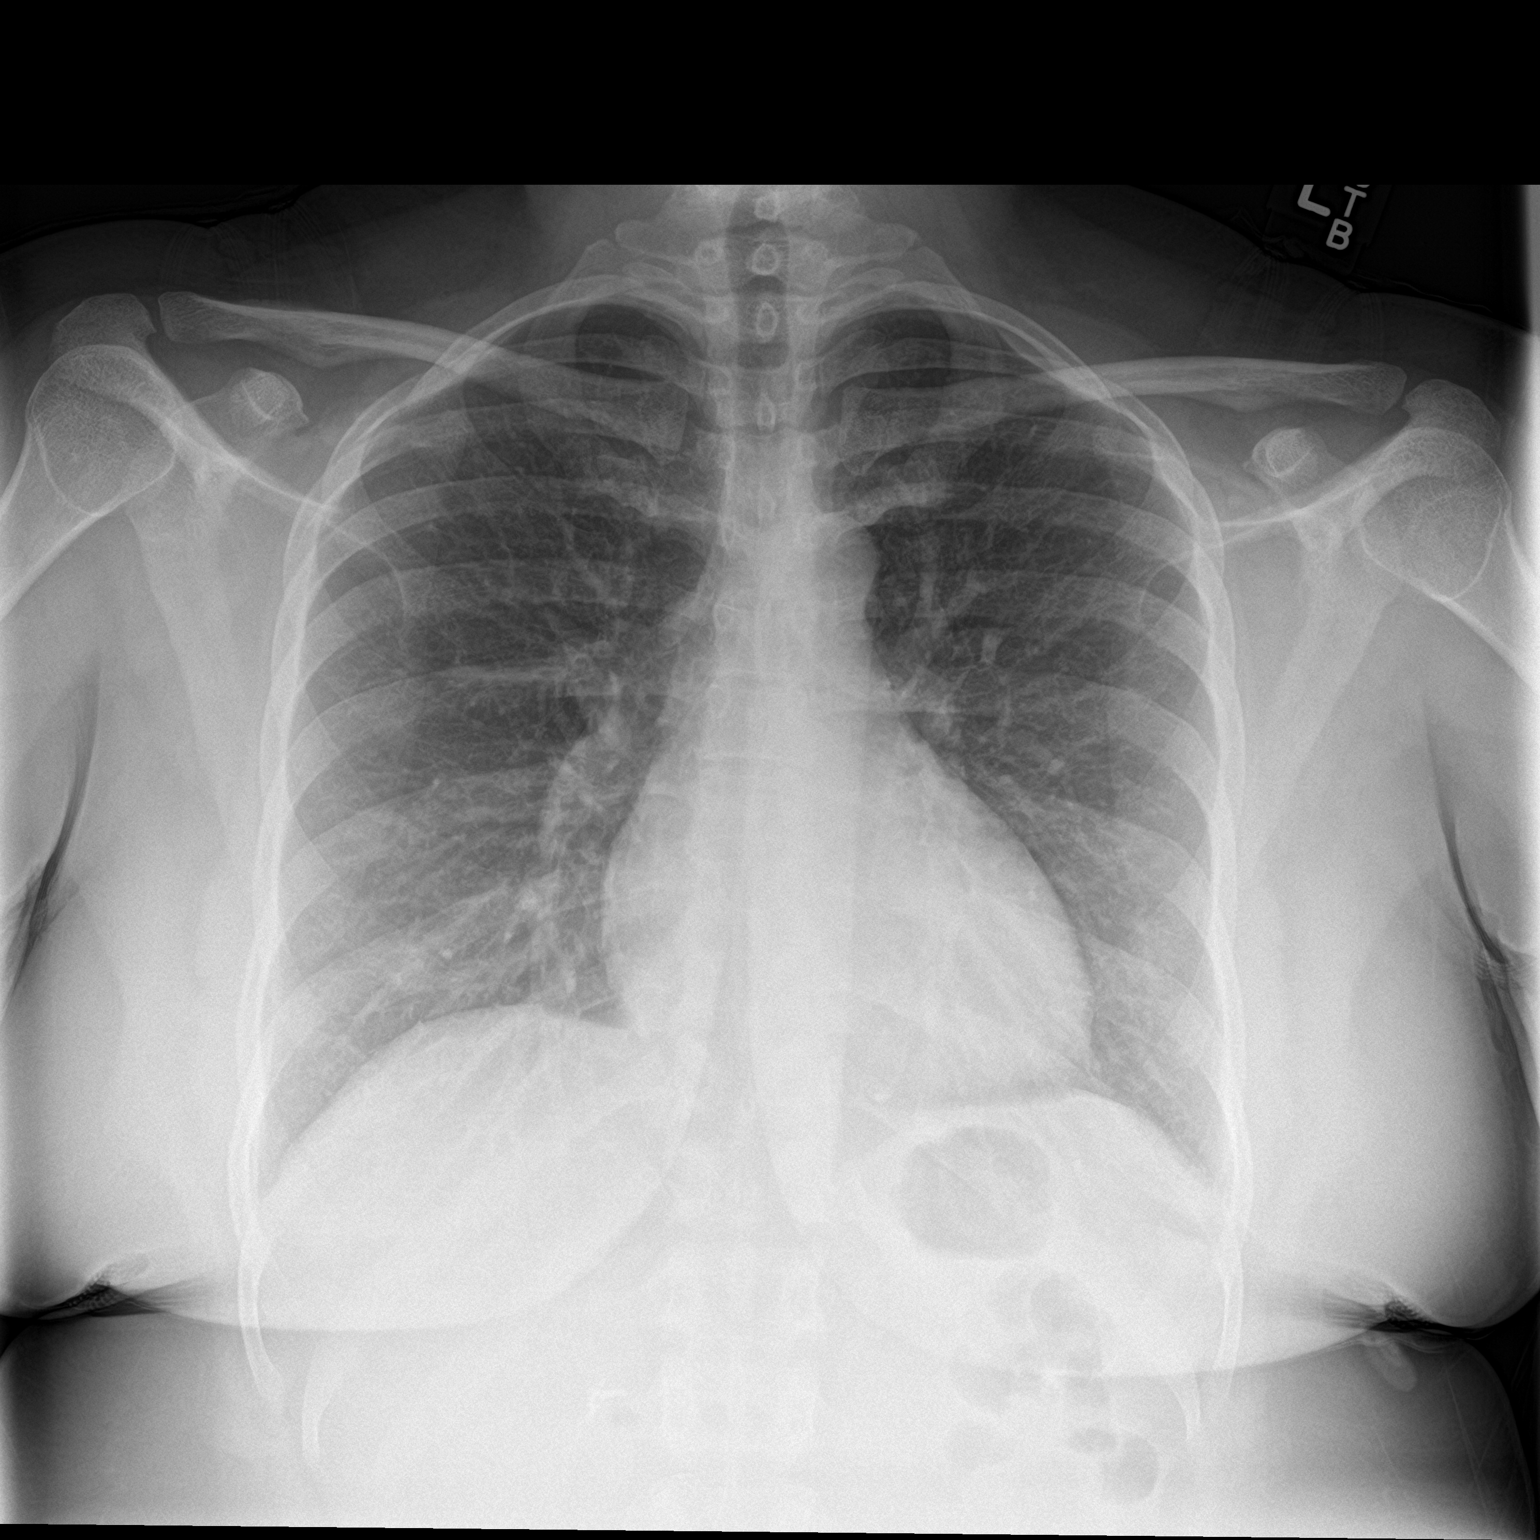

[chest lat]
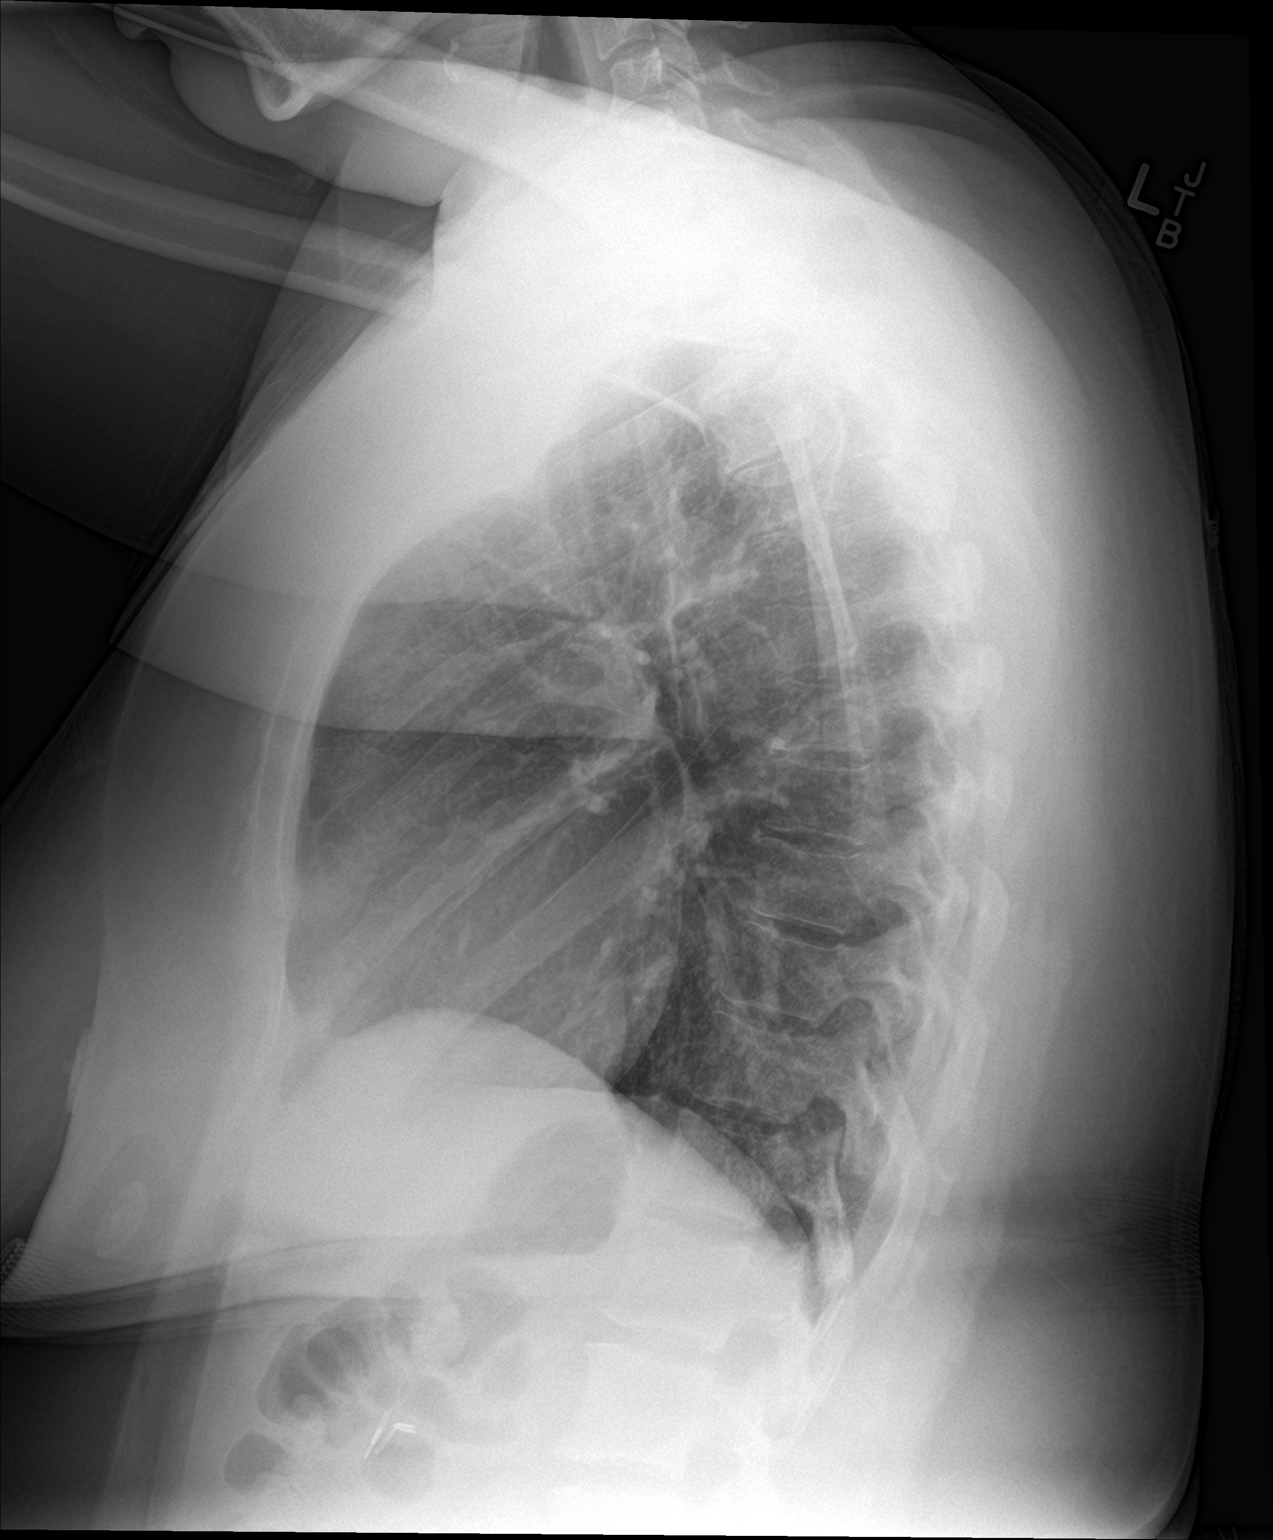

[2 of 2 positions shown; findings below may reference images not displayed]

FINDINGS: The heart size and mediastinal contours are within normal limits.
Both lungs are clear. The visualized skeletal structures are
unremarkable.
IMPRESSION: Normal chest x-ray.

## 2020-08-19 ENCOUNTER — Ambulatory Visit (HOSPITAL_COMMUNITY)
Admission: EM | Admit: 2020-08-19 | Discharge: 2020-08-19 | Disposition: A | Discharge status: Home / Self Care | Payer: Medicaid Other | Attending: Emergency Medicine | Admitting: Emergency Medicine

## 2020-08-19 ENCOUNTER — Encounter (HOSPITAL_COMMUNITY): Payer: Self-pay | Admitting: *Deleted

## 2020-08-19 ENCOUNTER — Other Ambulatory Visit: Payer: Self-pay

## 2020-08-19 DIAGNOSIS — R110 Nausea: Secondary | ICD-10-CM | POA: Diagnosis not present

## 2020-08-19 DIAGNOSIS — Z3202 Encounter for pregnancy test, result negative: Secondary | ICD-10-CM

## 2020-08-19 DIAGNOSIS — R1084 Generalized abdominal pain: Secondary | ICD-10-CM | POA: Insufficient documentation

## 2020-08-19 LAB — POCT URINALYSIS DIPSTICK, ED / UC
Glucose, UA: NEGATIVE mg/dL
Hgb urine dipstick: NEGATIVE
Ketones, ur: 15 mg/dL — AB
Nitrite: NEGATIVE
Protein, ur: 100 mg/dL — AB
Specific Gravity, Urine: >=1.03 (ref 1.005–1.030)
Urobilinogen, UA: 0.2 mg/dL (ref 0.0–1.0)
pH: 6 (ref 5.0–8.0)

## 2020-08-19 LAB — POC URINE PREG, ED: Preg Test, Ur: NEGATIVE

## 2020-08-19 MED ORDER — ONDANSETRON 4 MG PO TBDP: 4.0000 mg | ORAL_TABLET | Freq: Three times a day (TID) | ORAL | 0 refills | Status: DC | PRN

## 2020-08-19 NOTE — Discharge Instructions (Signed)
Continue taking the metronidazole as directed by your gynecologist.   Take the antinausea medication as directed.    Keep yourself hydrated with clear liquids, such as water, Gatorade, Pedialyte, Sprite, or ginger ale.    Go to the emergency department if you have acute worsening symptoms.    Follow up with your primary care provider if your symptoms are not improving.

## 2020-08-19 NOTE — ED Triage Notes (Signed)
Pt started metronidazole for vag DC. Pt went to her GYN DR for Vag dc. Pt has had ABD pain since starting the ant-bx. Pt reports decreased po intake .

## 2020-08-19 NOTE — ED Provider Notes (Signed)
[facility]    CSN: [id number] Arrival date & time: 08/19/20  4944      History   Chief Complaint Chief Complaint  Patient presents with  . Abdominal Pain    HPI Erin Osborn is a 40 y.o. female.   Patient presents with generalized abdominal pain x4 days.  She also reports nausea but no vomiting, diarrhea, constipation, fever, pelvic pain, or other symptoms.  Last bowel movement yesterday.  She was seen by her OB/GYN 4 days ago for vaginal discharge and started on metronidazole for bacterial vaginitis.  She states that her abdominal discomfort started after starting the metronidazole.  Her medical history includes hypertension, obesity, chronic headaches, depression.    The history is provided by the patient and medical records.    Past Medical History:  Diagnosis Date  . Migraines    otc med prn  . SVD (spontaneous vaginal delivery)    x 2    Patient Active Problem List   Diagnosis Date Noted  . Axillary abscess 04/25/2020  . Dizziness 12/10/2019  . COVID-19 virus infection 07/26/2019  . Hypertension 02/01/2019  . Chest wall pain 06/03/2017  . Allergic conjunctivitis 12/12/2015  . Elevated glucose tolerance test 04/28/2015  . Rash and nonspecific skin eruption 10/30/2012  . Blepharoconjunctivitis 03/30/2011  . Chronic headaches 10/08/2010  . ALLERGIC RHINITIS 10/31/2008  . OBESITY 08/02/2008  . GERD 06/21/2008  . DEPRESSIVE DISORDER, NOS 08/11/2006  . Hemorrhoids 08/11/2006    Past Surgical History:  Procedure Laterality Date  . CHOLECYSTECTOMY     Gallstones removed-Around 40 years of age  . DILATION AND CURETTAGE OF UTERUS N/A 10/18/2014   Procedure: SUCTION DILATATION AND CURETTAGE;  Surgeon: Woodroe Mode, MD;  Location: [facility];  Service: Gynecology;  Laterality: N/A;  . DILATION AND EVACUATION N/A 11/12/2016   Procedure: DILATATION AND EVACUATION;  Surgeon: Allyn Kenner, DO;  Location: [facility];  Service: Gynecology;  Laterality: N/A;     OB History   This patient's OB History needs to be verified. Open OB History to review and resolve any issues.  Gravida  5   Para  2   Term  2   Preterm      AB  3   Living  2     SAB  3   IAB      Ectopic      Multiple  0   Live Births  2        Obstetric Comments  D & C  After the last two miscarriages.  Approximate dates for the last two miscarriages.          Home Medications    Prior to Admission medications   Medication Sig Start Date End Date Taking? Authorizing Provider  ondansetron (ZOFRAN ODT) 4 MG disintegrating tablet Take 1 tablet (4 mg total) by mouth every 8 (eight) hours as needed for nausea or vomiting. 08/19/20  Yes Sharion Balloon, NP  amLODipine (NORVASC) 5 MG tablet TAKE 1 TABLET BY MOUTH EVERYDAY AT BEDTIME 03/07/20   Autry-Lott, Naaman Plummer, DO  Benzocaine-Menthol (CEPACOL SORE THROAT) 15-3.6 MG LOZG Use as directed 1 lozenge in the mouth or throat 4 (four) times daily as needed (sore throat). 06/27/20   Noe Gens, PA-C  benzonatate (TESSALON) 100 MG capsule Take 1 capsule (100 mg total) by mouth every 8 (eight) hours. 06/27/20   Noe Gens, PA-C  Blood Pressure Monitoring (BLOOD PRESSURE MONITOR AUTOMAT) DEVI 1 kit by Does not  apply route daily. Please call the office if you have any issues obtaining this monitor. 12/06/19   Wilber Oliphant, MD  cetirizine (ZYRTEC) 10 MG tablet Take 1 tablet (10 mg total) by mouth daily. 12/06/19   Wilber Oliphant, MD  hydrocortisone cream 1 % Apply 1 application topically 2 (two) times daily as needed (eczema).    [provider]  medroxyPROGESTERone (DEPO-PROVERA) 150 MG/ML injection Present to office next week with syringe for injection 11/12/16   Allyn Kenner, DO  naproxen (NAPROSYN) 375 MG tablet Take 1 tablet (375 mg total) by mouth 2 (two) times daily with a meal. 04/20/20   Jaynee Eagles, PA-C  sulfamethoxazole-trimethoprim (BACTRIM DS) 800-160 MG tablet Take 1 tablet by mouth 2 (two) times daily.  04/20/20   Jaynee Eagles, PA-C    Family History Family History  Problem Relation Age of Onset  . Hypertension Mother   . Diabetes Mother   . Hypertension Maternal Grandmother   . Diabetes Maternal Grandmother   . Alcohol abuse Father   . Drug abuse Father   . Cancer Father   . Hypertension Sister   . Drug abuse Brother   . Alcohol abuse Brother   . Acute myelogenous leukemia Neg Hx     Social History Social History   Tobacco Use  . Smoking status: Never Smoker  . Smokeless tobacco: Never Used  Substance Use Topics  . Alcohol use: No  . Drug use: No     Allergies   Patient has no known allergies.   Review of Systems Review of Systems  Constitutional: Negative for chills and fever.  HENT: Negative for ear pain and sore throat.   Eyes: Negative for pain and visual disturbance.  Respiratory: Negative for cough and shortness of breath.   Cardiovascular: Negative for chest pain and palpitations.  Gastrointestinal: Positive for abdominal pain and nausea. Negative for constipation, diarrhea and vomiting.  Genitourinary: Negative for dysuria, hematuria and pelvic pain.  Musculoskeletal: Negative for arthralgias and back pain.  Skin: Negative for color change and rash.  Neurological: Negative for seizures and syncope.  All other systems reviewed and are negative.    Physical Exam Triage Vital Signs ED Triage Vitals  Enc Vitals Group     BP      Pulse      Resp      Temp      Temp src      SpO2      Weight      Height      Head Circumference      Peak Flow      Pain Score      Pain Loc      Pain Edu?      Excl. in [place]?    No data found.  Updated Vital Signs BP 117/82 (BP Location: Left Arm)   Pulse 77   Temp 98.9 F (37.2 C) (Oral)   Resp 18   SpO2 97%   Visual Acuity Right Eye Distance:   Left Eye Distance:   Bilateral Distance:    Right Eye Near:   Left Eye Near:    Bilateral Near:     Physical Exam Vitals and nursing note reviewed.   Constitutional:      General: She is not in acute distress.    Appearance: She is well-developed and well-nourished. She is not ill-appearing.  HENT:     Head: Normocephalic and atraumatic.     Mouth/Throat:  Mouth: Mucous membranes are moist.  Eyes:     Conjunctiva/sclera: Conjunctivae normal.  Cardiovascular:     Rate and Rhythm: Normal rate and regular rhythm.     Heart sounds: Normal heart sounds.  Pulmonary:     Effort: Pulmonary effort is normal. No respiratory distress.     Breath sounds: Normal breath sounds.  Abdominal:     General: Bowel sounds are normal.     Palpations: Abdomen is soft.     Tenderness: There is generalized abdominal tenderness. There is no right CVA tenderness, left CVA tenderness, guarding or rebound.  Musculoskeletal:        General: No edema.     Cervical back: Neck supple.  Skin:    General: Skin is warm and dry.  Neurological:     General: No focal deficit present.     Mental Status: She is alert and oriented to person, place, and time.     Gait: Gait normal.  Psychiatric:        Mood and Affect: Mood and affect and mood normal.        Behavior: Behavior normal.      UC Treatments / Results  Labs (all labs ordered are listed, but only abnormal results are displayed) Labs Reviewed  POCT URINALYSIS DIPSTICK, ED / UC - Abnormal; Notable for the following components:      Result Value   Bilirubin Urine MODERATE (*)    Ketones, ur 15 (*)    Protein, ur 100 (*)    Leukocytes,Ua TRACE (*)    All other components within normal limits  URINE CULTURE  POC URINE PREG, ED    EKG   Radiology No results found.  Procedures Procedures (including critical care time)  Medications Ordered in UC Medications - No data to display  Initial Impression / Assessment and Plan / UC Course  I have reviewed the triage vital signs and the nursing notes.  Pertinent labs & imaging results that were available during my care of the patient were  reviewed by me and considered in my medical decision making (see chart for details).    Generalized abdominal pain, nausea.  Treating nausea with Zofran.  Instructed patient to continue taking the metronidazole as directed by her gynecologist.  Instructed her to remain hydrated with clear liquids.  Discussed going to the ED if she has acute abdominal pain or other concerning symptoms.  Instructed her to follow-up with her PCP or OB/GYN if her symptoms are not improving.  She agrees to plan of care.   Final Clinical Impressions(s) / UC Diagnoses   Final diagnoses:  Generalized abdominal pain  Nausea     Discharge Instructions     Continue taking the metronidazole as directed by your gynecologist.   Take the antinausea medication as directed.    Keep yourself hydrated with clear liquids, such as water, Gatorade, Pedialyte, Sprite, or ginger ale.    Go to the emergency department if you have acute worsening symptoms.    Follow up with your primary care provider if your symptoms are not improving.         ED Prescriptions    Medication Sig Dispense Auth. Provider   ondansetron (ZOFRAN ODT) 4 MG disintegrating tablet Take 1 tablet (4 mg total) by mouth every 8 (eight) hours as needed for nausea or vomiting. 20 tablet Sharion Balloon, NP     PDMP not reviewed this encounter.   Sharion Balloon, NP 08/19/20 [phone]

## 2020-08-20 LAB — URINE CULTURE: Culture: <10000 — AB

## 2020-09-24 ENCOUNTER — Encounter (HOSPITAL_COMMUNITY): Payer: Self-pay | Admitting: Emergency Medicine

## 2020-09-24 ENCOUNTER — Other Ambulatory Visit: Payer: Self-pay

## 2020-09-24 ENCOUNTER — Ambulatory Visit (HOSPITAL_COMMUNITY)
Admission: EM | Admit: 2020-09-24 | Discharge: 2020-09-24 | Disposition: A | Discharge status: Home / Self Care | Payer: Medicaid Other | Attending: Emergency Medicine | Admitting: Emergency Medicine

## 2020-09-24 DIAGNOSIS — N766 Ulceration of vulva: Secondary | ICD-10-CM

## 2020-09-24 MED ORDER — LIDOCAINE (ANORECTAL) 5 % EX GEL: CUTANEOUS | 0 refills | Status: DC

## 2020-09-24 NOTE — Discharge Instructions (Addendum)
I have swabbed the affected area to ensure this is not related to infection or sti.  I suspect this is related to local trauma and will heal in the next 10 days.  Limit agitation to the area and keep clean and dry.  Return for any worsening of symptoms.

## 2020-09-24 NOTE — ED Provider Notes (Signed)
[place]    CSN: [id number] Arrival date & time: 09/24/20  1315      History   Chief Complaint Chief Complaint  Patient presents with  . Abscess    HPI Erin Osborn is a 40 y.o. female.   Erin Osborn presents with complaints of vulvar pain she noted over the past few days which has worsened today. Has had an axillary abscess in the past and she is concerned this is similar. No previous genital complaints in the past. She had had intercourse prior to onset of symptoms. Screened for STD's, including RPR and hiv 08/15/20 with her PCP and was negative. No pelvic pain. No known drainage. She is uncertain if the affected area has increased in size or not. No fevers.     ROS per HPI, negative if not otherwise mentioned.      Past Medical History:  Diagnosis Date  . Migraines    otc med prn  . SVD (spontaneous vaginal delivery)    x 2    Patient Active Problem List   Diagnosis Date Noted  . Axillary abscess 04/25/2020  . Dizziness 12/10/2019  . COVID-19 virus infection 07/26/2019  . Hypertension 02/01/2019  . Chest wall pain 06/03/2017  . Allergic conjunctivitis 12/12/2015  . Elevated glucose tolerance test 04/28/2015  . Rash and nonspecific skin eruption 10/30/2012  . Blepharoconjunctivitis 03/30/2011  . Chronic headaches 10/08/2010  . ALLERGIC RHINITIS 10/31/2008  . OBESITY 08/02/2008  . GERD 06/21/2008  . DEPRESSIVE DISORDER, NOS 08/11/2006  . Hemorrhoids 08/11/2006    Past Surgical History:  Procedure Laterality Date  . CHOLECYSTECTOMY     Gallstones removed-Around 40 years of age  . DILATION AND CURETTAGE OF UTERUS N/A 10/18/2014   Procedure: SUCTION DILATATION AND CURETTAGE;  Surgeon: Woodroe Mode, MD;  Location: [facility];  Service: Gynecology;  Laterality: N/A;  . DILATION AND EVACUATION N/A 11/12/2016   Procedure: DILATATION AND EVACUATION;  Surgeon: Allyn Kenner, DO;  Location: [facility];  Service: Gynecology;  Laterality: N/A;     OB History   This patient's OB History needs to be verified. Open OB History to review and resolve any issues.  Gravida  5   Para  2   Term  2   Preterm      AB  3   Living  2     SAB  3   IAB      Ectopic      Multiple  0   Live Births  2        Obstetric Comments  D & C  After the last two miscarriages.  Approximate dates for the last two miscarriages.          Home Medications    Prior to Admission medications   Medication Sig Start Date End Date Taking? Authorizing Provider  Lidocaine, Anorectal, 5 % GEL Apply topically to affected area three times a day as needed for pain 09/24/20  Yes Der Gagliano B, NP  amLODipine (NORVASC) 5 MG tablet TAKE 1 TABLET BY MOUTH EVERYDAY AT BEDTIME 03/07/20   Autry-Lott, Naaman Plummer, DO  Benzocaine-Menthol (CEPACOL SORE THROAT) 15-3.6 MG LOZG Use as directed 1 lozenge in the mouth or throat 4 (four) times daily as needed (sore throat). 06/27/20   Noe Gens, PA-C  benzonatate (TESSALON) 100 MG capsule Take 1 capsule (100 mg total) by mouth every 8 (eight) hours. 06/27/20   Noe Gens, PA-C  Blood Pressure Monitoring (BLOOD PRESSURE  MONITOR AUTOMAT) DEVI 1 kit by Does not apply route daily. Please call the office if you have any issues obtaining this monitor. 12/06/19   Wilber Oliphant, MD  cetirizine (ZYRTEC) 10 MG tablet Take 1 tablet (10 mg total) by mouth daily. 12/06/19   Wilber Oliphant, MD  hydrocortisone cream 1 % Apply 1 application topically 2 (two) times daily as needed (eczema).    [provider]  medroxyPROGESTERone (DEPO-PROVERA) 150 MG/ML injection Present to office next week with syringe for injection 11/12/16   Allyn Kenner, DO  naproxen (NAPROSYN) 375 MG tablet Take 1 tablet (375 mg total) by mouth 2 (two) times daily with a meal. 04/20/20   Jaynee Eagles, PA-C  ondansetron (ZOFRAN ODT) 4 MG disintegrating tablet Take 1 tablet (4 mg total) by mouth every 8 (eight) hours as needed for nausea or vomiting.  08/19/20   Sharion Balloon, NP  sulfamethoxazole-trimethoprim (BACTRIM DS) 800-160 MG tablet Take 1 tablet by mouth 2 (two) times daily. 04/20/20   Jaynee Eagles, PA-C    Family History Family History  Problem Relation Age of Onset  . Hypertension Mother   . Diabetes Mother   . Hypertension Maternal Grandmother   . Diabetes Maternal Grandmother   . Alcohol abuse Father   . Drug abuse Father   . Cancer Father   . Hypertension Sister   . Drug abuse Brother   . Alcohol abuse Brother   . Acute myelogenous leukemia Neg Hx     Social History Social History   Tobacco Use  . Smoking status: Never Smoker  . Smokeless tobacco: Never Used  Substance Use Topics  . Alcohol use: No  . Drug use: No     Allergies   Patient has no known allergies.   Review of Systems Review of Systems   Physical Exam Triage Vital Signs ED Triage Vitals  Enc Vitals Group     BP 09/24/20 1426 (!) 143/89     Pulse Rate 09/24/20 1426 83     Resp 09/24/20 1426 17     Temp 09/24/20 1426 98.6 F (37 C)     Temp Source 09/24/20 1426 Oral     SpO2 09/24/20 1426 100 %     Weight --      Height --      Head Circumference --      Peak Flow --      Pain Score 09/24/20 1424 6     Pain Loc --      Pain Edu? --      Excl. in [place]? --    No data found.  Updated Vital Signs BP (!) 143/89 (BP Location: Right Arm)   Pulse 83   Temp 98.6 F (37 C) (Oral)   Resp 17   SpO2 100%   Visual Acuity Right Eye Distance:   Left Eye Distance:   Bilateral Distance:    Right Eye Near:   Left Eye Near:    Bilateral Near:     Physical Exam Constitutional:      General: She is not in acute distress.    Appearance: She is well-developed.  Cardiovascular:     Rate and Rhythm: Normal rate.  Pulmonary:     Effort: Pulmonary effort is normal.  Genitourinary:      Comments: Ulceration noted at posterior labial junction, approximately 0.75 cm in diameter; creamy white vaginal discharge noted on external exam   Skin:    General: Skin is warm and dry.  Neurological:     Mental Status: She is alert and oriented to person, place, and time.      UC Treatments / Results  Labs (all labs ordered are listed, but only abnormal results are displayed) Labs Reviewed  HSV CULTURE AND TYPING  CERVICOVAGINAL ANCILLARY ONLY    EKG   Radiology No results found.  Procedures Procedures (including critical care time)  Medications Ordered in UC Medications - No data to display  Initial Impression / Assessment and Plan / UC Course  I have reviewed the triage vital signs and the nursing notes.  Pertinent labs & imaging results that were available during my care of the patient were reviewed by me and considered in my medical decision making (see chart for details).     I suspect local injury with intercourse is source of this ulcer/lesion. hsv swab collected and pending for certainty, no other lesions noted, however. Vaginal cytology collected and pending. Negative RPR 08/15/20. Supportive cares for pain management discussed. Patient verbalized understanding and agreeable to plan.   Final Clinical Impressions(s) / UC Diagnoses   Final diagnoses:  Vulvar ulceration     Discharge Instructions     I have swabbed the affected area to ensure this is not related to infection or sti.  I suspect this is related to local trauma and will heal in the next 10 days.  Limit agitation to the area and keep clean and dry.  Return for any worsening of symptoms.    ED Prescriptions    Medication Sig Dispense Auth. Provider   Lidocaine, Anorectal, 5 % GEL Apply topically to affected area three times a day as needed for pain 30 g Augusto Gamble B, NP     PDMP not reviewed this encounter.   Zigmund Gottron, NP 09/24/20 [phone]

## 2020-09-24 NOTE — ED Triage Notes (Signed)
Pt presents with abscess in groin area xs 1 week.

## 2020-09-25 LAB — CERVICOVAGINAL ANCILLARY ONLY
Bacterial Vaginitis (gardnerella): NEGATIVE
Candida Glabrata: NEGATIVE
Candida Vaginitis: NEGATIVE
Chlamydia: NEGATIVE
Comment: NEGATIVE
Comment: NEGATIVE
Comment: NEGATIVE
Comment: NEGATIVE
Comment: NEGATIVE
Comment: NORMAL
Neisseria Gonorrhea: NEGATIVE
Trichomonas: NEGATIVE

## 2020-09-26 LAB — HSV CULTURE AND TYPING

## 2020-10-14 ENCOUNTER — Other Ambulatory Visit: Payer: Self-pay

## 2020-10-14 ENCOUNTER — Ambulatory Visit: Payer: Medicaid Other | Admitting: Family Medicine

## 2020-10-14 ENCOUNTER — Encounter: Payer: Self-pay | Admitting: Family Medicine

## 2020-10-14 VITALS — BP 128/70 | HR 84 | Ht 60.0 in | Wt 192.0 lb

## 2020-10-14 DIAGNOSIS — R300 Dysuria: Secondary | ICD-10-CM | POA: Diagnosis not present

## 2020-10-14 DIAGNOSIS — R102 Pelvic and perineal pain: Secondary | ICD-10-CM | POA: Diagnosis present

## 2020-10-14 LAB — POCT URINALYSIS DIP (MANUAL ENTRY)
Bilirubin, UA: NEGATIVE
Glucose, UA: NEGATIVE mg/dL
Leukocytes, UA: NEGATIVE
Nitrite, UA: NEGATIVE
Protein Ur, POC: NEGATIVE mg/dL
Spec Grav, UA: 1.025 (ref 1.010–1.025)
Urobilinogen, UA: 1 E.U./dL
pH, UA: 6 (ref 5.0–8.0)

## 2020-10-14 NOTE — Progress Notes (Signed)
    SUBJECTIVE:   CHIEF COMPLAINT / HPI:   Vaginal Burning Symptoms began 2 weeks ago. Described as vaginal burning that occurs only with urination, wiping, or cleaning the area. No urgency or frequency. No odor or discharge. When symptoms initially began, patient had pain with intercourse. Has not engaged in intercourse since that time. Has been using vaseline and OTC "itch-relief" cream without improvement. Patient was seen at Urgent Care on 09/24/20 when her symptoms first began. She had comprehensive testing at that time, which was negative for gonorrhea, chlamydia, trichomonas, BV, yeast, HSV or urinary tract infection. She was diagnosed with a vaginal ulceration and given Rx for topical lidocaine, although she has been unable to pick it up from her pharmacy.   PERTINENT  PMH / PSH: Obesity, HTN, GERD, chronic headaches  OBJECTIVE:   BP 128/70   Pulse 84   Ht 5' (1.524 m)   Wt 192 lb (87.1 kg)   SpO2 98%   BMI 37.50 kg/m   Physical Exam Exam conducted with a chaperone present.  Constitutional:      General: She is not in acute distress.    Appearance: Normal appearance.  Pulmonary:     Effort: Pulmonary effort is normal.  Genitourinary:      Comments: Small superficial fissure/erosion noted at inferior aspect of vaginal introitus which is tender to palpation. Remainder of external genitalia normal without lesions Neurological:     Mental Status: She is alert.      ASSESSMENT/PLAN:   Vaginal pain Symptoms present x2 weeks. Exam concerning for small fissure at the inferior aspect of the vaginal introitus. Testing performed 2 weeks ago at Urgent Care was negative for gonorrhea, chlamydia, trich, BV, yeast, HSV, or urinary tract infection. -UA negative for infection again today -Recommended topical lidocaine cream prn -Avoid intercourse or other local trauma until symptoms resolve -Return if no improvement in 2 weeks     Maury Dus, MD [facility]

## 2020-10-14 NOTE — Patient Instructions (Addendum)
It was great to see you!  It looks like you have a small fissure (cut) on the outside of your vagina. It should heal with time.  You can apply lidocaine to the area as needed for pain. You can also continue using vaseline.  You should refrain from sexual activity until the pain goes away completely.   If it's not improved in 2 weeks, please return to the office for additional testing.  Take care,  Dr. [facility]

## 2020-10-14 NOTE — Assessment & Plan Note (Addendum)
Symptoms present x2 weeks. Exam concerning for small fissure at the inferior aspect of the vaginal introitus. Testing performed 2 weeks ago at Urgent Care was negative for gonorrhea, chlamydia, trich, BV, yeast, HSV, or urinary tract infection. -UA negative for infection again today -Recommended topical lidocaine cream prn -Avoid intercourse or other local trauma until symptoms resolve -Return if no improvement in 2 weeks

## 2020-11-08 ENCOUNTER — Other Ambulatory Visit: Payer: Self-pay

## 2020-11-08 ENCOUNTER — Emergency Department (HOSPITAL_BASED_OUTPATIENT_CLINIC_OR_DEPARTMENT_OTHER)
Admission: EM | Admit: 2020-11-08 | Discharge: 2020-11-08 | Disposition: A | Discharge status: Home / Self Care | Payer: Medicaid Other | Attending: Emergency Medicine | Admitting: Emergency Medicine

## 2020-11-08 ENCOUNTER — Encounter (HOSPITAL_BASED_OUTPATIENT_CLINIC_OR_DEPARTMENT_OTHER): Payer: Self-pay | Admitting: Emergency Medicine

## 2020-11-08 DIAGNOSIS — J029 Acute pharyngitis, unspecified: Secondary | ICD-10-CM | POA: Insufficient documentation

## 2020-11-08 DIAGNOSIS — Z79899 Other long term (current) drug therapy: Secondary | ICD-10-CM | POA: Diagnosis not present

## 2020-11-08 DIAGNOSIS — Z8616 Personal history of COVID-19: Secondary | ICD-10-CM | POA: Diagnosis not present

## 2020-11-08 DIAGNOSIS — I10 Essential (primary) hypertension: Secondary | ICD-10-CM | POA: Diagnosis not present

## 2020-11-08 MED ORDER — SUCRALFATE 1 G PO TABS: 1.0000 g | ORAL_TABLET | Freq: Three times a day (TID) | ORAL | 0 refills | Status: DC

## 2020-11-08 MED ORDER — OMEPRAZOLE 20 MG PO CPDR: 20.0000 mg | DELAYED_RELEASE_CAPSULE | Freq: Every day | ORAL | 0 refills | Status: DC

## 2020-11-08 MED ORDER — ALUM & MAG HYDROXIDE-SIMETH 200-200-20 MG/5ML PO SUSP
30.0000 mL | Freq: Once | ORAL | Status: AC
  Administered 2020-11-08: 30 mL via ORAL
  Filled 2020-11-08: qty 30

## 2020-11-08 NOTE — ED Provider Notes (Signed)
[facility] EMERGENCY DEPT Provider Note   CSN: [id number] Arrival date & time: 11/08/20  1733     History Chief Complaint  Patient presents with  . Sore Throat    Erin Osborn is a 40 y.o. female.  Patient having heartburn symptoms with globus sensation when she eats at times.  Not having much of a sore throat or fever or chills.  Took some Protonix yesterday.  No food impaction symptoms.  No nausea or vomiting.  The history is provided by the patient.  Sore Throat This is a new problem. The problem occurs daily. Pertinent negatives include no chest pain, no abdominal pain, no headaches and no shortness of breath. Nothing aggravates the symptoms. Nothing relieves the symptoms. She has tried nothing for the symptoms. The treatment provided no relief.       Past Medical History:  Diagnosis Date  . Migraines    otc med prn  . SVD (spontaneous vaginal delivery)    x 2    Patient Active Problem List   Diagnosis Date Noted  . Vaginal pain 10/14/2020  . Axillary abscess 04/25/2020  . Dizziness 12/10/2019  . COVID-19 virus infection 07/26/2019  . Hypertension 02/01/2019  . Chest wall pain 06/03/2017  . Allergic conjunctivitis 12/12/2015  . Elevated glucose tolerance test 04/28/2015  . Rash and nonspecific skin eruption 10/30/2012  . Blepharoconjunctivitis 03/30/2011  . Chronic headaches 10/08/2010  . ALLERGIC RHINITIS 10/31/2008  . OBESITY 08/02/2008  . GERD 06/21/2008  . DEPRESSIVE DISORDER, NOS 08/11/2006  . Hemorrhoids 08/11/2006    Past Surgical History:  Procedure Laterality Date  . CHOLECYSTECTOMY     Gallstones removed-Around 40 years of age  . DILATION AND CURETTAGE OF UTERUS N/A 10/18/2014   Procedure: SUCTION DILATATION AND CURETTAGE;  Surgeon: Woodroe Mode, MD;  Location: [facility];  Service: Gynecology;  Laterality: N/A;  . DILATION AND EVACUATION N/A 11/12/2016   Procedure: DILATATION AND EVACUATION;  Surgeon: Allyn Kenner, DO;   Location: [facility];  Service: Gynecology;  Laterality: N/A;     OB History   This patient's OB History needs to be verified. Open OB History to review and resolve any issues.  Gravida  5   Para  2   Term  2   Preterm      AB  3   Living  2     SAB  3   IAB      Ectopic      Multiple  0   Live Births  2        Obstetric Comments  D & C  After the last two miscarriages.  Approximate dates for the last two miscarriages.         Family History  Problem Relation Age of Onset  . Hypertension Mother   . Diabetes Mother   . Hypertension Maternal Grandmother   . Diabetes Maternal Grandmother   . Alcohol abuse Father   . Drug abuse Father   . Cancer Father   . Hypertension Sister   . Drug abuse Brother   . Alcohol abuse Brother   . Acute myelogenous leukemia Neg Hx     Social History   Tobacco Use  . Smoking status: Never Smoker  . Smokeless tobacco: Never Used  Substance Use Topics  . Alcohol use: No  . Drug use: No    Home Medications Prior to Admission medications   Medication Sig Start Date End Date Taking? Authorizing Provider  omeprazole (PRILOSEC) 20  MG capsule Take 1 capsule (20 mg total) by mouth daily for 14 days. 11/08/20 11/22/20 Yes Curatolo, Adam, DO  sucralfate (CARAFATE) 1 g tablet Take 1 tablet (1 g total) by mouth 4 (four) times daily -  with meals and at bedtime for 14 days. 11/08/20 11/22/20 Yes Curatolo, Adam, DO  amLODipine (NORVASC) 5 MG tablet TAKE 1 TABLET BY MOUTH EVERYDAY AT BEDTIME 03/07/20   Autry-Lott, Naaman Plummer, DO  Benzocaine-Menthol (CEPACOL SORE THROAT) 15-3.6 MG LOZG Use as directed 1 lozenge in the mouth or throat 4 (four) times daily as needed (sore throat). 06/27/20   Noe Gens, PA-C  benzonatate (TESSALON) 100 MG capsule Take 1 capsule (100 mg total) by mouth every 8 (eight) hours. 06/27/20   Noe Gens, PA-C  Blood Pressure Monitoring (BLOOD PRESSURE MONITOR AUTOMAT) DEVI 1 kit by Does not apply route daily. Please call  the office if you have any issues obtaining this monitor. 12/06/19   Wilber Oliphant, MD  cetirizine (ZYRTEC) 10 MG tablet Take 1 tablet (10 mg total) by mouth daily. 12/06/19   Wilber Oliphant, MD  hydrocortisone cream 1 % Apply 1 application topically 2 (two) times daily as needed (eczema).    [provider]  Lidocaine, Anorectal, 5 % GEL Apply topically to affected area three times a day as needed for pain 09/24/20   Augusto Gamble B, NP  medroxyPROGESTERone (DEPO-PROVERA) 150 MG/ML injection Present to office next week with syringe for injection 11/12/16   Allyn Kenner, DO  naproxen (NAPROSYN) 375 MG tablet Take 1 tablet (375 mg total) by mouth 2 (two) times daily with a meal. 04/20/20   Jaynee Eagles, PA-C  ondansetron (ZOFRAN ODT) 4 MG disintegrating tablet Take 1 tablet (4 mg total) by mouth every 8 (eight) hours as needed for nausea or vomiting. 08/19/20   Sharion Balloon, NP  sulfamethoxazole-trimethoprim (BACTRIM DS) 800-160 MG tablet Take 1 tablet by mouth 2 (two) times daily. 04/20/20   Jaynee Eagles, PA-C    Allergies    Patient has no known allergies.  Review of Systems   Review of Systems  Constitutional: Negative for chills and fever.  HENT: Positive for trouble swallowing (globus sensation at times). Negative for congestion, ear discharge, ear pain, facial swelling and sore throat.   Eyes: Negative for pain and visual disturbance.  Respiratory: Negative for cough and shortness of breath.   Cardiovascular: Negative for chest pain and palpitations.  Gastrointestinal: Negative for abdominal pain and vomiting.  Genitourinary: Negative for dysuria and hematuria.  Musculoskeletal: Negative for arthralgias and back pain.  Skin: Negative for color change and rash.  Neurological: Negative for seizures, syncope and headaches.  All other systems reviewed and are negative.   Physical Exam Updated Vital Signs BP (!) 135/96 (BP Location: Right Arm)   Pulse 82   Temp 98.8 F (37.1 C)  (Oral)   Resp 18   Ht 5' (1.524 m)   Wt 86.2 kg   SpO2 98%   BMI 37.11 kg/m   Physical Exam Vitals and nursing note reviewed.  Constitutional:      General: She is not in acute distress.    Appearance: She is well-developed.  HENT:     Head: Normocephalic and atraumatic.     Mouth/Throat:     Mouth: Mucous membranes are moist. No oral lesions.     Pharynx: No pharyngeal swelling, oropharyngeal exudate, posterior oropharyngeal erythema or uvula swelling.     Tonsils: No tonsillar exudate or tonsillar  abscesses.  Eyes:     Conjunctiva/sclera: Conjunctivae normal.  Neck:     Thyroid: No thyroid mass, thyromegaly or thyroid tenderness.  Cardiovascular:     Rate and Rhythm: Normal rate and regular rhythm.     Heart sounds: No murmur heard.   Pulmonary:     Effort: Pulmonary effort is normal. No respiratory distress.     Breath sounds: Normal breath sounds.  Abdominal:     Palpations: Abdomen is soft.     Tenderness: There is no abdominal tenderness.  Musculoskeletal:     Cervical back: Full passive range of motion without pain, normal range of motion and neck supple. No torticollis. No pain with movement. Normal range of motion.  Lymphadenopathy:     Cervical: No cervical adenopathy.  Skin:    General: Skin is warm and dry.     Capillary Refill: Capillary refill takes less than 2 seconds.  Neurological:     Mental Status: She is alert.     ED Results / Procedures / Treatments   Labs (all labs ordered are listed, but only abnormal results are displayed) Labs Reviewed - No data to display  EKG None  Radiology No results found.  Procedures Procedures   Medications Ordered in ED Medications  alum & mag hydroxide-simeth (MAALOX/MYLANTA) 200-200-20 MG/5ML suspension 30 mL (has no administration in time range)    ED Course  I have reviewed the triage vital signs and the nursing notes.  Pertinent labs & imaging results that were available during my care of the  patient were reviewed by me and considered in my medical decision making (see chart for details).    MDM Rules/Calculators/A&P                          Constantina R Menzel is here mostly with heartburn symptoms.  Normal vitals.  No fever.  Feels like she is having difficulty with eating at times.  Feels like she is getting food stuck when she eats but is not having any trouble with drinking or keeping food down.  No food impaction symptoms.  Was able to eat and drink today without much issue.  Has had have history of reflux in the past and took Protonix yesterday with some relief.  No evidence of strep on exam.  Had a negative COVID test at home.  Normal vitals here, no fever.  No obvious thyromegaly.  No wheezing and clear breath sounds.  Overall suspect reflux type symptoms.  Given GI cocktail and will trial patient on PPI and Carafate and have her follow-up with her primary care doctor.  Discharged in good condition.  She had no lymphadenopathy on exam and no concern for infectious process.  This chart was dictated using voice recognition software.  Despite best efforts to proofread,  errors can occur which can change the documentation meaning.   Final Clinical Impression(s) / ED Diagnoses Final diagnoses:  Sore throat    Rx / DC Orders ED Discharge Orders         Ordered    omeprazole (PRILOSEC) 20 MG capsule  Daily        11/08/20 1840    sucralfate (CARAFATE) 1 g tablet  3 times daily with meals & bedtime        11/08/20 [place], [place], DO 11/08/20 1843

## 2020-11-08 NOTE — ED Triage Notes (Signed)
Reports sensation of "something stuck in my throat" for a couple of days. States no sore throat. States neg covid test this week.

## 2020-12-15 ENCOUNTER — Telehealth: Payer: Self-pay | Admitting: Family Medicine

## 2020-12-15 NOTE — Telephone Encounter (Signed)
**  After Hours/ Emergency Line Call**  Received a call to report that Erin Osborn for high blood pressure. She states her BP is 145/90-something. She states she has been off of blood pressure for a while.  She denies any symptoms other than stating that she has a mild headache.  She has no red flag symptoms.  I recommend that she take some Tylenol for her headache and that she be evaluated prior to restarting her blood pressure medication.  Patient is agreeable to this.  Did make appointment for patient on 7/7 where we can consider if she needs blood pressure medication or not.  Will forward to PCP as well as the provider scheduled to see her on 7/7  Jackelyn Poling, DO PGY-3, [facility] 12/15/2020 8:58 AM

## 2020-12-17 ENCOUNTER — Ambulatory Visit: Payer: Medicaid Other | Admitting: Family Medicine

## 2020-12-17 ENCOUNTER — Encounter: Payer: Self-pay | Admitting: Family Medicine

## 2020-12-17 ENCOUNTER — Other Ambulatory Visit: Payer: Self-pay

## 2020-12-17 VITALS — BP 112/82 | HR 82 | Ht 60.0 in | Wt 190.8 lb

## 2020-12-17 DIAGNOSIS — I10 Essential (primary) hypertension: Secondary | ICD-10-CM | POA: Diagnosis not present

## 2020-12-17 DIAGNOSIS — H6123 Impacted cerumen, bilateral: Secondary | ICD-10-CM | POA: Diagnosis not present

## 2020-12-17 DIAGNOSIS — R42 Dizziness and giddiness: Secondary | ICD-10-CM

## 2020-12-17 MED ORDER — CETIRIZINE HCL 10 MG PO TABS: 10.0000 mg | ORAL_TABLET | Freq: Every day | ORAL | 11 refills | Status: DC

## 2020-12-17 MED ORDER — AMLODIPINE BESYLATE 5 MG PO TABS: ORAL_TABLET | ORAL | 0 refills | Status: DC

## 2020-12-17 NOTE — Progress Notes (Signed)
    CHIEF COMPLAINT / HPI: 2 weeks of intermittent dizziness in the head ache.  Has a headache pretty much every day.  Symptoms come and go throughout the day.  She has had some dizziness particularly right after she stands and occasionally has to hold onto furniture in the house because she is wobbly.  Never had this before.  Notably she has not had blood pressure medicines for at least 2 months because she ran out.  Denies any spinning sensation, no blurred vision, no chest pains, no shortness of breath, no lower extremity edema.   PERTINENT  PMH / PSH: I have reviewed the patient's medications, allergies, past medical and surgical history, smoking status and updated in the EMR as appropriate.   OBJECTIVE:  BP 112/82   Pulse 82   Ht 5' (1.524 m)   Wt 190 lb 12.8 oz (86.5 kg)   SpO2 97%   BMI 37.26 kg/m   Vital signs reviewed. GENERAL: Well-developed, well-nourished, no acute distress. CARDIOVASCULAR: Regular rate and rhythm no murmur gallop or rub LUNGS: Clear to auscultation bilaterally, no rales or wheeze. ABDOMEN: Soft positive bowel sounds NEURO: No gross focal neurological deficits.  Normal gait.  No dizziness upon standing. MSK: Movement of extremity x 4. HEENT: Bilateral cerumen impaction.  Once that was removed TMs look relatively normal.  Pupils equal round reactive to light.  Extraocular muscles are intact.  No nystagmus.  ASSESSMENT / PLAN:   Hypertension Reviewed her blood pressure readings for the last 2 years.  Definitely think she needs to return to her medication even though her blood pressure today on check-in was not that abnormal.  May or may not be contributing to her dizziness.  I will have her follow-up in 2 weeks.  Dizziness Differential is broad.  Could be related to the cessation of the blood pressure medicine but since she stopped that about 2 months ago and did not have symptoms until the last 2 weeks I do not think this is necessarily the cause.   Could be related to the significant cerumen impaction and will see if that improves after removal of cerumen today.    I also told her to start back on her cetirizine.    We will check some labs today including hemoglobin to see if that could be contributing to dizziness.   Have her follow-up in 2 weeks to see if this is resolved.  Red flags reviewed specifically new symptoms, worsening dizziness.   Denny Levy MD

## 2020-12-17 NOTE — Assessment & Plan Note (Signed)
Reviewed her blood pressure readings for the last 2 years.  Definitely think she needs to return to her medication even though her blood pressure today on check-in was not that abnormal.  May or may not be contributing to her dizziness.  I will have her follow-up in 2 weeks.

## 2020-12-17 NOTE — Assessment & Plan Note (Addendum)
Differential is broad.  Could be related to the cessation of the blood pressure medicine but since she stopped that about 2 months ago and did not have symptoms until the last 2 weeks I do not think this is necessarily the cause.  Could be related to the significant cerumen impaction and will see if that improves after removal of cerumen today.    I also told her to start back on her cetirizine.    We will check some labs today including hemoglobin to see if that could be contributing to dizziness.   Have her follow-up in 2 weeks to see if this is resolved.  Red flags reviewed specifically new symptoms, worsening dizziness.

## 2020-12-17 NOTE — Patient Instructions (Signed)
For your dizziness, I would recommend you restart your blood pressure pill which I have called in refills.  I would also have you restart the Zyrtec which I have also called in.  We tried to clean out some of the wax in your ear today which can occasionally interfere with your balance.  I am going to do some laboratory work and have you schedule an appointment back to see  your primary care provider in a couple of weeks.  If there is anything on your lab work that is worrisome, I will call you otherwise I will send you a note in the mail.  If your symptoms get worse or you have new symptoms like chest pain or shortness of breath, please let us know as soon as possible or go to the emergency room.  Hopefully these interventions that were doing today and restarting her medicines will help.  Great to see you!

## 2020-12-18 ENCOUNTER — Ambulatory Visit: Payer: Medicaid Other | Admitting: Student

## 2020-12-18 LAB — CBC
Hematocrit: 37.3 % (ref 34.0–46.6)
Hemoglobin: 12.1 g/dL (ref 11.1–15.9)
MCH: 28.7 pg (ref 26.6–33.0)
MCHC: 32.4 g/dL (ref 31.5–35.7)
MCV: 89 fL (ref 79–97)
Platelets: 273 10*3/uL (ref 150–450)
RBC: 4.21 x10E6/uL (ref 3.77–5.28)
RDW: 13.1 % (ref 11.7–15.4)
WBC: 8.5 10*3/uL (ref 3.4–10.8)

## 2020-12-18 LAB — BASIC METABOLIC PANEL
BUN/Creatinine Ratio: 10 (ref 9–23)
BUN: 7 mg/dL (ref 6–20)
CO2: 23 mmol/L (ref 20–29)
Calcium: 9 mg/dL (ref 8.7–10.2)
Chloride: 104 mmol/L (ref 96–106)
Creatinine, Ser: 0.69 mg/dL (ref 0.57–1.00)
Glucose: 110 mg/dL — ABNORMAL HIGH (ref 65–99)
Potassium: 3.8 mmol/L (ref 3.5–5.2)
Sodium: 141 mmol/L (ref 134–144)
eGFR: 113 mL/min/{1.73_m2} (ref >59)

## 2020-12-18 LAB — TSH: TSH: 0.742 u[IU]/mL (ref 0.450–4.500)

## 2020-12-19 ENCOUNTER — Encounter: Payer: Self-pay | Admitting: Family Medicine

## 2021-03-10 ENCOUNTER — Ambulatory Visit: Payer: Medicaid Other | Admitting: Family Medicine

## 2021-03-10 VITALS — BP 122/82 | HR 76

## 2021-03-10 DIAGNOSIS — M25571 Pain in right ankle and joints of right foot: Secondary | ICD-10-CM

## 2021-03-10 MED ORDER — NAPROXEN 500 MG PO TABS: 500.0000 mg | ORAL_TABLET | Freq: Two times a day (BID) | ORAL | 0 refills | Status: DC

## 2021-03-10 NOTE — Progress Notes (Signed)
  Subjective:     Patient ID: Erin Osborn, female   DOB: August 12, 1980, 40 y.o.   MRN: [id number]  HPI Erin Osborn [facility] today for evaluation of right ankle pain x 2 days. She denies any known injury. She reports pain to right lateral ankle with walking, tender to touch. Tried taking otc aleve once. Has been using ace bandage. Denies swelling, redness or heat.   Past Medical History:  Diagnosis Date   Migraines    otc med prn   SVD (spontaneous vaginal delivery)    x 2   No Known Allergies  Current Outpatient Medications:    amLODipine (NORVASC) 5 MG tablet, TAKE 1 TABLET BY MOUTH EVERYDAY AT BEDTIME, Disp: 90 tablet, Rfl: 0   medroxyPROGESTERone (DEPO-PROVERA) 150 MG/ML injection, Present to office next week with syringe for injection, Disp: 1 mL, Rfl: 3   naproxen (NAPROSYN) 500 MG tablet, Take 1 tablet (500 mg total) by mouth 2 (two) times daily with a meal., Disp: 28 tablet, Rfl: 0   cetirizine (ZYRTEC) 10 MG tablet, Take 1 tablet (10 mg total) by mouth daily. (Patient not taking: Reported on 03/10/2021), Disp: 30 tablet, Rfl: 11    Review of Systems  Constitutional:  Negative for chills, fever and unexpected weight change.  Musculoskeletal:  Negative for joint swelling.       See HPI  Skin:  Negative for color change and rash.  Neurological:  Negative for weakness and numbness.      Objective:   Physical Exam Vitals reviewed.  Constitutional:      General: She is not in acute distress.    Appearance: Normal appearance.  HENT:     Head: Normocephalic and atraumatic.  Cardiovascular:     Rate and Rhythm: Normal rate.  Pulmonary:     Effort: Pulmonary effort is normal.  Musculoskeletal:     Right ankle: No swelling, deformity or ecchymosis. Tenderness present over the CF ligament and posterior TF ligament. Normal range of motion. Anterior drawer test negative. Normal pulse.     Right Achilles Tendon: Normal.     Left ankle:  Normal.     Left Achilles Tendon: Normal.     Comments: Antalgic gait  Skin:    General: Skin is warm and dry.     Findings: No bruising, erythema or rash.  Neurological:     Mental Status: She is alert and oriented to person, place, and time.  Psychiatric:        Mood and Affect: Mood normal.        Behavior: Behavior normal.   Today's Vitals   03/10/21 1031  BP: 122/82  Pulse: 76  SpO2: 99%   There is no height or weight on file to calculate BMI.     Assessment:     Acute right ankle pain     Plan:     Will treat conservatively with trial of antiinflammatory medication and RICE. If no improvement or worsening of symptoms over the next week recommend f/u with PCP.

## 2021-03-27 ENCOUNTER — Ambulatory Visit (INDEPENDENT_AMBULATORY_CARE_PROVIDER_SITE_OTHER): Payer: Medicaid Other | Admitting: Family Medicine

## 2021-03-27 ENCOUNTER — Other Ambulatory Visit: Payer: Self-pay

## 2021-03-27 VITALS — BP 120/80 | HR 75 | Wt 196.4 lb

## 2021-03-27 DIAGNOSIS — Z0000 Encounter for general adult medical examination without abnormal findings: Secondary | ICD-10-CM

## 2021-03-27 DIAGNOSIS — Z23 Encounter for immunization: Secondary | ICD-10-CM

## 2021-03-27 DIAGNOSIS — Z1159 Encounter for screening for other viral diseases: Secondary | ICD-10-CM

## 2021-03-27 DIAGNOSIS — M25561 Pain in right knee: Secondary | ICD-10-CM
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
## 2021-03-27 NOTE — Patient Instructions (Signed)
Patellar Tendinitis Patellar tendinitis is also called jumper's knee or patellar tendinopathy. This condition happens when there is damage to and inflammation of the patellar tendon. Tendons are cord-like tissues that connect muscles to bones. The patellar tendon connects the bottom of the kneecap (patella) to the top of the shin bone (tibia). Patellar tendinitis causes pain in the front of the knee. The condition happens in the following stages: Stage 1: In this stage, you have pain only after activity. Stage 2: In this stage, you have pain during and after activity. Stage 3: In this stage, you have pain at rest as well as during and after activity. The pain limits your ability to do the activity. Stage 4: In this stage, the tendon tears and severely limits your activity. What are the causes? This condition is caused by repeated (repetitive) stress on the tendon. This stress may cause the tendon to stretch, swell, thicken, or tear. What increases the risk? The following factors may make you more likely to develop this condition: Participating in sports that involve running, kicking, and jumping, especially on hard surfaces. These include: Basketball. Volleyball. Soccer. Track and field. Training too hard. Having tight thigh muscles. Having received steroid injections in the tendon. Having had knee surgery. Being 43-50 years old. Having rheumatoid arthritis, diabetes, or kidney disease. These conditions interrupt blood flow to the knee, causing the tendon to weaken. What are the signs or symptoms? The main symptom of this condition is pain and swelling in the front of the knee. The pain usually starts slowly and gradually gets worse. It may become painful to straighten your leg. The pain may get worse when you walk, run, or jump. How is this diagnosed? This condition may be diagnosed based on: Your symptoms. Your medical history. A physical exam. During the physical exam, your health care  provider may check for: Tenderness in your patella. Tightness in your thigh muscles. Pain when you straighten your knee. Imaging tests, including: X-rays. These will show the position and condition of your patella. An MRI. This will show any abnormality of the tendon. Ultrasound. This will show any swelling or other abnormalities of the tendon. How is this treated? Treatment for this condition depends on the stage of the condition. It may involve: Avoiding activities that cause pain, such as jumping. Icing and elevating your knee. Having sound wave stimulation to promote healing. Doing stretching and strengthening exercises (physical therapy) when pain and swelling improve. Wearing a knee brace. This may be needed if your condition does not improve with treatment. Using crutches or a walker. This may be needed if your condition does not improve with treatment. Surgery. This may be done if you have stage 4 tendinitis. Follow these instructions at home: If you have a brace: Wear the brace as told by your health care provider. Remove it only as told by your health care provider. Loosen the brace if your toes tingle, become numb, or turn cold and blue. Keep the brace clean. If the brace is not waterproof: Do not let it get wet. Cover it with a watertight covering when you take a bath or shower. Ask your health care provider when it is safe for you to drive. Managing pain, stiffness, and swelling  If directed, put ice on the injured area. If you have a removable brace, remove it as told by your health care provider. Put ice in a plastic bag. Place a towel between your skin and the bag. Leave the ice on for 20  minutes, 2-3 times a day. Move your toes often to reduce stiffness and swelling. Raise (elevate) your knee above the level of your heart while you are sitting or lying down. Activity Do not use the injured limb to support your body weight until your health care provider says that  you can. Use your crutches or a walker as told by your health care provider. Return to your normal activities as told by your health care provider. Ask your health care provider what activities are safe for you. Do exercises as told by your health care provider or physical therapist. General instructions Take over-the-counter and prescription medicines only as told by your health care provider. Do not use any products that contain nicotine or tobacco, such as cigarettes, e-cigarettes, and chewing tobacco. These can delay healing. If you need help quitting, ask your health care provider. Keep all follow-up visits as told by your health care provider. This is important. How is this prevented? Warm up and stretch before being active. Cool down and stretch after being active. Give your body time to rest between periods of activity. You may need to reduce how often you play a sport that requires frequent jumping. Make sure to use equipment that fits you. Be safe and responsible while being active. This will help you avoid falls which can damage the tendon. Do at least 150 minutes of moderate-intensity exercise each week, such as brisk walking or water aerobics. Maintain physical fitness, including: Strength. Flexibility. Cardiovascular fitness. Endurance. Contact a health care provider if: Your symptoms have not improved in 6 weeks. Your symptoms get worse. Summary Patellar tendinitis is also called jumper's knee or patellar tendinopathy. This condition happens when there is damage to and inflammation of the patellar tendon. Treatment for this condition depends on the stage of the condition and may include rest, ice, exercises, medicines, and surgery. Do not use the injured limb to support your body weight until your health care provider says that you can. Take over-the-counter and prescription medicines only as told by your health care provider. Keep all follow-up visits as told by your  health care provider. This is important. This information is not intended to replace advice given to you by your health care provider. Make sure you discuss any questions you have with your health care provider. Document Revised: 09/21/2018 Document Reviewed: 04/24/2018 Elsevier Patient Education  2022 [employer].

## 2021-03-28 LAB — LIPID PANEL
Chol/HDL Ratio: 3.1 ratio (ref 0.0–4.4)
Cholesterol, Total: 130 mg/dL (ref 100–199)
HDL: 42 mg/dL (ref >39)
LDL Chol Calc (NIH): 77 mg/dL (ref 0–99)
Triglycerides: 47 mg/dL (ref 0–149)
VLDL Cholesterol Cal: 11 mg/dL (ref 5–40)

## 2021-03-28 LAB — HCV INTERPRETATION

## 2021-03-28 LAB — HIV ANTIBODY (ROUTINE TESTING W REFLEX): HIV Screen 4th Generation wRfx: NONREACTIVE

## 2021-03-28 LAB — HCV AB W REFLEX TO QUANT PCR: HCV Ab: <0.1 s/co ratio (ref 0.0–0.9)

## 2021-07-22 ENCOUNTER — Other Ambulatory Visit: Payer: Self-pay

## 2021-07-22 ENCOUNTER — Ambulatory Visit
Admission: RE | Admit: 2021-07-22 | Discharge: 2021-07-22 | Disposition: A | Discharge status: Home / Self Care | Payer: Medicaid Other | Source: Ambulatory Visit | Attending: Urgent Care | Admitting: Urgent Care

## 2021-07-22 VITALS — BP 127/84 | HR 68 | Temp 98.2°F | Resp 18

## 2021-07-22 DIAGNOSIS — R350 Frequency of micturition: Secondary | ICD-10-CM

## 2021-07-22 DIAGNOSIS — M545 Low back pain, unspecified: Secondary | ICD-10-CM | POA: Diagnosis present

## 2021-07-22 HISTORY — DX: Essential (primary) hypertension: I10

## 2021-07-22 LAB — POCT URINALYSIS DIP (MANUAL ENTRY)
Bilirubin, UA: NEGATIVE
Blood, UA: NEGATIVE
Glucose, UA: NEGATIVE mg/dL
Ketones, POC UA: NEGATIVE mg/dL
Nitrite, UA: NEGATIVE
Protein Ur, POC: NEGATIVE mg/dL
Spec Grav, UA: 1.02 (ref 1.010–1.025)
Urobilinogen, UA: 1 E.U./dL
pH, UA: 7 (ref 5.0–8.0)

## 2021-07-22 MED ORDER — TIZANIDINE HCL 4 MG PO TABS: 4.0000 mg | ORAL_TABLET | Freq: Every day | ORAL | 0 refills | Status: DC

## 2021-07-22 MED ORDER — IBUPROFEN 600 MG PO TABS: 600.0000 mg | ORAL_TABLET | Freq: Four times a day (QID) | ORAL | 0 refills | Status: DC | PRN

## 2021-07-22 NOTE — Discharge Instructions (Addendum)
Make sure you hydrate very well with plain water and a quantity of 64 ounces of water a day.  Please limit drinks that are considered urinary irritants such as soda, sweet tea, coffee, energy drinks, alcohol.  These can worsen your urinary and genital symptoms but also be the source of them.  I will let you know about your urine culture results through MyChart to see if we need to prescribe or change your antibiotics based off of those results.  In the meantime, use ibuprofen for your back pain and also tizanidine as a muscle relaxant.

## 2021-07-22 NOTE — ED Provider Notes (Signed)
[facility]   MRN: [id number] DOB: Jan 23, 1981  Subjective:   Erin Osborn is a 41 y.o. female presenting for 1 week history of persistent intermittent urinary frequency, urinary urgency, low back pain.  Denies fever, nausea, vomiting, dysuria, fall, trauma, weakness, numbness or tingling.  No history of any back issues or musculoskeletal disorders.  Patient does not perform strenuous lifting activities.  Does not hydrate with water at all.  Drinks soda, juice, pink lemonade, coffee.  Patient receives Depo injections for contraception.  No chance of pregnancy.  No current facility-administered medications for this encounter.  Current Outpatient Medications:    amLODipine (NORVASC) 5 MG tablet, TAKE 1 TABLET BY MOUTH EVERYDAY AT BEDTIME, Disp: 90 tablet, Rfl: 0   cetirizine (ZYRTEC) 10 MG tablet, Take 1 tablet (10 mg total) by mouth daily. (Patient not taking: Reported on 03/10/2021), Disp: 30 tablet, Rfl: 11   medroxyPROGESTERone (DEPO-PROVERA) 150 MG/ML injection, Present to office next week with syringe for injection, Disp: 1 mL, Rfl: 3   naproxen (NAPROSYN) 500 MG tablet, Take 1 tablet (500 mg total) by mouth 2 (two) times daily with a meal., Disp: 28 tablet, Rfl: 0   No Known Allergies  Past Medical History:  Diagnosis Date   Hypertension    Migraines    otc med prn   SVD (spontaneous vaginal delivery)    x 2     Past Surgical History:  Procedure Laterality Date   CHOLECYSTECTOMY     Gallstones removed-Around 41 years of age   DILATION AND CURETTAGE OF UTERUS N/A 10/18/2014   Procedure: SUCTION DILATATION AND CURETTAGE;  Surgeon: Adam Phenix, MD;  Location: [facility];  Service: Gynecology;  Laterality: N/A;   DILATION AND EVACUATION N/A 11/12/2016   Procedure: DILATATION AND EVACUATION;  Surgeon: Philip Aspen, DO;  Location: [facility];  Service: Gynecology;  Laterality: N/A;    Family History  Problem Relation Age of Onset   Hypertension Mother    Diabetes  Mother    Hypertension Maternal Grandmother    Diabetes Maternal Grandmother    Alcohol abuse Father    Drug abuse Father    Cancer Father    Hypertension Sister    Drug abuse Brother    Alcohol abuse Brother    Acute myelogenous leukemia Neg Hx     Social History   Tobacco Use   Smoking status: Never   Smokeless tobacco: Never  Substance Use Topics   Alcohol use: No   Drug use: No    ROS   Objective:   Vitals: BP 127/84 (BP Location: Left Arm)    Pulse 68    Temp 98.2 F (36.8 C) (Oral)    Resp 18    SpO2 99%   Physical Exam Constitutional:      General: She is not in acute distress.    Appearance: Normal appearance. She is well-developed. She is not ill-appearing, toxic-appearing or diaphoretic.  HENT:     Head: Normocephalic and atraumatic.     Nose: Nose normal.     Mouth/Throat:     Mouth: Mucous membranes are moist.  Eyes:     General: No scleral icterus.       Right eye: No discharge.        Left eye: No discharge.     Extraocular Movements: Extraocular movements intact.  Cardiovascular:     Rate and Rhythm: Normal rate.  Pulmonary:     Effort: Pulmonary effort is normal.  Musculoskeletal:  Lumbar back: Spasms and tenderness present. No swelling, edema, deformity, signs of trauma, lacerations or bony tenderness. Normal range of motion. Negative right straight leg raise test and negative left straight leg raise test. No scoliosis.     Comments: Patient ambulates without assistance at an expected pace.  No midline tenderness.  Skin:    General: Skin is warm and dry.  Neurological:     General: No focal deficit present.     Mental Status: She is alert and oriented to person, place, and time.  Psychiatric:        Mood and Affect: Mood normal.        Behavior: Behavior normal.    Results for orders placed or performed during the hospital encounter of 07/22/21 (from the past 24 hour(s))  POCT urinalysis dipstick     Status: Abnormal   Collection  Time: 07/22/21 11:33 AM  Result Value Ref Range   Color, UA yellow yellow   Clarity, UA clear clear   Glucose, UA negative negative mg/dL   Bilirubin, UA negative negative   Ketones, POC UA negative negative mg/dL   Spec Grav, UA 3.500 9.381 - 1.025   Blood, UA negative negative   pH, UA 7.0 5.0 - 8.0   Protein Ur, POC negative negative mg/dL   Urobilinogen, UA 1.0 0.2 or 1.0 E.U./dL   Nitrite, UA Negative Negative   Leukocytes, UA Trace (A) Negative    Assessment and Plan :   PDMP not reviewed this encounter.  1. Urinary frequency   2. Acute bilateral low back pain without sciatica     Urine culture pending.  We will hold off on antibiotic use for now.  Recommended hydrating much better with plain water.  Limit urinary irritants.  Use ibuprofen and tizanidine for the back pain which I suspect is related to dehydration, possible deconditioning. Counseled patient on potential for adverse effects with medications prescribed/recommended today, ER and return-to-clinic precautions discussed, patient verbalized understanding.    Wallis Bamberg, [place] 07/22/21 1232

## 2021-07-22 NOTE — ED Triage Notes (Signed)
Pt c/o urinary frequency, severe lower back pain,   Denies hematuria, dysuria, discharge, malodor.   Onset ~ 1 week ago

## 2021-07-24 ENCOUNTER — Telehealth: Payer: Self-pay

## 2021-07-24 LAB — URINE CULTURE

## 2021-07-24 NOTE — Telephone Encounter (Signed)
Pt called inquiring about urine culture results. Results explained to pt, verbalized understanding. Will continue taking current medications and return to clinic of symptoms worsen or no improvement was tx is completed.

## 2021-08-07 ENCOUNTER — Ambulatory Visit: Payer: Self-pay

## 2021-08-27 ENCOUNTER — Other Ambulatory Visit: Payer: Self-pay | Admitting: Family Medicine

## 2021-11-17 ENCOUNTER — Encounter: Payer: Self-pay | Admitting: *Deleted

## 2022-03-30 ENCOUNTER — Other Ambulatory Visit: Payer: Self-pay

## 2022-03-30 ENCOUNTER — Ambulatory Visit: Payer: Medicaid Other | Admitting: Student

## 2022-03-30 VITALS — BP 119/93 | HR 75 | Wt 197.8 lb

## 2022-03-30 DIAGNOSIS — J069 Acute upper respiratory infection, unspecified: Secondary | ICD-10-CM

## 2022-03-30 NOTE — Progress Notes (Signed)
    SUBJECTIVE:   CHIEF COMPLAINT / HPI:   Patient is a 41 year old female who presents today for 1 week of cough denies having any fever but associated symptoms include runny nose and congestion.She has normal appetite and drinking adequately. Known recent sick contact in daughter who has had coughh and running nose for about 2 weeks.  Patient denies chest pain, shortness of breath, ear pain, vomiting or diarrhea.  PERTINENT  PMH / PSH: Reviewed   OBJECTIVE:   BP (!) 119/93   Pulse 75   Wt 197 lb 12.8 oz (89.7 kg)   SpO2 99%   BMI 38.63 kg/m    Physical Exam General: Alert, well appearing, NAD HEENT: No pharyngeal erythema without tonsillar exudate, no cervical lymphadenopathy Cardiovascular: RRR, No Murmurs, Normal S2/S2 Respiratory: CTAB, No wheezing or Rales Abdomen: No distension or tenderness   ASSESSMENT/PLAN:   Viral illness 41 year old female with cough, congestion and rhinorrhea. She is afebrile today and on exam has oropharyngeal erythema,  good work of breathing on RA and clear breath sounds bilaterally. Overall presentation and exam is consistent with viral URI.  Discussed conservative management. Recommended tylenol or ibuprofen for fever. Encouraged adequate hydration for patient. Outline signs and symptoms that will warrant ED visit or return for further assessment.     Alen Bleacher, MD [place]

## 2022-03-30 NOTE — Patient Instructions (Signed)

## 2022-08-16 ENCOUNTER — Ambulatory Visit
Admission: RE | Admit: 2022-08-16 | Discharge: 2022-08-16 | Disposition: A | Discharge status: Home / Self Care | Payer: Medicaid Other | Source: Ambulatory Visit | Attending: Internal Medicine | Admitting: Internal Medicine

## 2022-08-16 VITALS — BP 123/82 | HR 113 | Temp 99.3°F | Resp 18 | Ht 60.0 in | Wt 180.0 lb

## 2022-08-16 DIAGNOSIS — Z1152 Encounter for screening for COVID-19: Secondary | ICD-10-CM | POA: Insufficient documentation

## 2022-08-16 DIAGNOSIS — J069 Acute upper respiratory infection, unspecified: Secondary | ICD-10-CM

## 2022-08-16 DIAGNOSIS — J029 Acute pharyngitis, unspecified: Secondary | ICD-10-CM | POA: Diagnosis not present

## 2022-08-16 DIAGNOSIS — R059 Cough, unspecified: Secondary | ICD-10-CM | POA: Diagnosis not present

## 2022-08-16 LAB — POCT RAPID STREP A (OFFICE): Rapid Strep A Screen: NEGATIVE

## 2022-08-16 MED ORDER — FLUTICASONE PROPIONATE 50 MCG/ACT NA SUSP: 1.0000 | Freq: Every day | NASAL | 0 refills | Status: DC

## 2022-08-16 MED ORDER — BENZONATATE 100 MG PO CAPS: 100.0000 mg | ORAL_CAPSULE | Freq: Three times a day (TID) | ORAL | 0 refills | Status: DC | PRN

## 2022-08-16 MED ORDER — IBUPROFEN 800 MG PO TABS
800.0000 mg | ORAL_TABLET | Freq: Once | ORAL | Status: AC
  Administered 2022-08-16: 800 mg via ORAL

## 2022-08-16 NOTE — Discharge Instructions (Signed)
It appears that you have a viral illness causing your symptoms.  COVID test is pending.  Will call if it is abnormal.  Your rapid strep is negative.  Throat culture is pending.  I have prescribed you medications to alleviate symptoms.  Recommend adequate fluids and rest and following up tomorrow to have vital signs rechecked.

## 2022-08-16 NOTE — ED Provider Notes (Signed)
[facility]    CSN: [id number] Arrival date & time: 08/16/22  1653      History   Chief Complaint Chief Complaint  Patient presents with   Cough    Need to be tested for covid and flu - Entered by patient    HPI Erin Osborn is a 42 y.o. female.   Patient presents with cough, body aches, headache, nasal congestion, sore throat that started about 4 days ago.  Patient has taken Mucinex, Tylenol, Delsym with no improvement in symptoms.  Denies history of asthma or COPD.  Denies chest pain, shortness of breath, nausea, vomiting, diarrhea, abdominal pain.  Patient does not smoke cigarettes.  Denies any known sick contacts or fever.   Cough   Past Medical History:  Diagnosis Date   Hypertension    Migraines    otc med prn   SVD (spontaneous vaginal delivery)    x 2    Patient Active Problem List   Diagnosis Date Noted   Dizziness 12/10/2019   Hypertension 02/01/2019   Allergic conjunctivitis 12/12/2015   Elevated glucose tolerance test 04/28/2015   Rash and nonspecific skin eruption 10/30/2012   Blepharoconjunctivitis 03/30/2011   Chronic headaches 10/08/2010   ALLERGIC RHINITIS 10/31/2008   OBESITY 08/02/2008   GERD 06/21/2008   DEPRESSIVE DISORDER, NOS 08/11/2006   Hemorrhoids 08/11/2006    Past Surgical History:  Procedure Laterality Date   CHOLECYSTECTOMY     Gallstones removed-Around 42 years of age   86 AND CURETTAGE OF UTERUS N/A 10/18/2014   Procedure: SUCTION DILATATION AND CURETTAGE;  Surgeon: Woodroe Mode, MD;  Location: [facility];  Service: Gynecology;  Laterality: N/A;   DILATION AND EVACUATION N/A 11/12/2016   Procedure: DILATATION AND EVACUATION;  Surgeon: Allyn Kenner, DO;  Location: [facility];  Service: Gynecology;  Laterality: N/A;    OB History   This patient's OB History needs to be verified. Open OB History to review and resolve any issues.  Gravida  5   Para  2   Term  2   Preterm      AB  3   Living  2       SAB  3   IAB      Ectopic      Multiple  0   Live Births  2        Obstetric Comments  D & C  After the last two miscarriages.  Approximate dates for the last two miscarriages.           Home Medications    Prior to Admission medications   Medication Sig Start Date End Date Taking? Authorizing Provider  amLODipine (NORVASC) 5 MG tablet TAKE 1 TABLET BY MOUTH EVERYDAY AT BEDTIME 08/27/21  Yes Autry-Lott, Simone, DO  benzonatate (TESSALON) 100 MG capsule Take 1 capsule (100 mg total) by mouth every 8 (eight) hours as needed for cough. 08/16/22  Yes Oley Lahaie, Hildred Alamin E, FNP  fluticasone (FLONASE) 50 MCG/ACT nasal spray Place 1 spray into both nostrils daily. 08/16/22  Yes Michaiah Holsopple, Hildred Alamin E, FNP  ibuprofen (ADVIL) 600 MG tablet Take 1 tablet (600 mg total) by mouth every 6 (six) hours as needed. 07/22/21  Yes Jaynee Eagles, PA-C  cetirizine (ZYRTEC) 10 MG tablet Take 1 tablet (10 mg total) by mouth daily. Patient not taking: Reported on 03/10/2021 12/17/20   Dickie La, MD  medroxyPROGESTERone (DEPO-PROVERA) 150 MG/ML injection Present to office next week with syringe for injection 11/12/16  Allyn Kenner, DO  naproxen (NAPROSYN) 500 MG tablet Take 1 tablet (500 mg total) by mouth 2 (two) times daily with a meal. 03/10/21   Cheyenne Adas, NP  tiZANidine (ZANAFLEX) 4 MG tablet Take 1 tablet (4 mg total) by mouth at bedtime. 07/22/21   Jaynee Eagles, PA-C    Family History Family History  Problem Relation Age of Onset   Hypertension Mother    Diabetes Mother    Hypertension Maternal Grandmother    Diabetes Maternal Grandmother    Alcohol abuse Father    Drug abuse Father    Cancer Father    Hypertension Sister    Drug abuse Brother    Alcohol abuse Brother    Acute myelogenous leukemia Neg Hx     Social History Social History   Tobacco Use   Smoking status: Never   Smokeless tobacco: Never  Vaping Use   Vaping Use: Never used  Substance Use Topics   Alcohol use: No   Drug  use: No     Allergies   Patient has no known allergies.   Review of Systems Review of Systems Per HPI  Physical Exam Triage Vital Signs ED Triage Vitals  Enc Vitals Group     BP 08/16/22 1712 125/84     Pulse Rate 08/16/22 1712 (!) 124     Resp 08/16/22 1712 18     Temp 08/16/22 1712 98.5 F (36.9 C)     Temp Source 08/16/22 1712 Oral     SpO2 08/16/22 1712 97 %     Weight 08/16/22 1714 180 lb (81.6 kg)     Height 08/16/22 1714 5' (1.524 m)     Head Circumference --      Peak Flow --      Pain Score 08/16/22 1714 7     Pain Loc --      Pain Edu? --      Excl. in [place]? --    No data found.  Updated Vital Signs BP 123/82 (BP Location: Right Arm)   Pulse (!) 113   Temp 99.3 F (37.4 C) (Oral)   Resp 18   Ht 5' (1.524 m)   Wt 180 lb (81.6 kg)   SpO2 96%   BMI 35.15 kg/m   Visual Acuity Right Eye Distance:   Left Eye Distance:   Bilateral Distance:    Right Eye Near:   Left Eye Near:    Bilateral Near:     Physical Exam Constitutional:      General: She is not in acute distress.    Appearance: Normal appearance. She is not toxic-appearing or diaphoretic.  HENT:     Head: Normocephalic and atraumatic.     Right Ear: Tympanic membrane and ear canal normal.     Left Ear: Tympanic membrane and ear canal normal.     Nose: Congestion present.     Mouth/Throat:     Mouth: Mucous membranes are moist.     Pharynx: Posterior oropharyngeal erythema present.  Eyes:     Extraocular Movements: Extraocular movements intact.     Conjunctiva/sclera: Conjunctivae normal.     Pupils: Pupils are equal, round, and reactive to light.  Cardiovascular:     Rate and Rhythm: Normal rate and regular rhythm.     Pulses: Normal pulses.     Heart sounds: Normal heart sounds.  Pulmonary:     Effort: Pulmonary effort is normal. No respiratory distress.     Breath sounds: Normal breath sounds.  No stridor. No wheezing, rhonchi or rales.  Abdominal:     General: Abdomen is flat.  Bowel sounds are normal.     Palpations: Abdomen is soft.  Musculoskeletal:        General: Normal range of motion.     Cervical back: Normal range of motion.  Skin:    General: Skin is warm and dry.  Neurological:     General: No focal deficit present.     Mental Status: She is alert and oriented to person, place, and time. Mental status is at baseline.  Psychiatric:        Mood and Affect: Mood normal.        Behavior: Behavior normal.      UC Treatments / Results  Labs (all labs ordered are listed, but only abnormal results are displayed) Labs Reviewed  CULTURE, GROUP A STREP ([place])  SARS CORONAVIRUS 2 (TAT 6-24 HRS)  BASIC METABOLIC PANEL  CBC  POCT RAPID STREP A (OFFICE)    EKG   Radiology No results found.  Procedures Procedures (including critical care time)  Medications Ordered in UC Medications  ibuprofen (ADVIL) tablet 800 mg (800 mg Oral Given 08/16/22 1724)    Initial Impression / Assessment and Plan / UC Course  I have reviewed the triage vital signs and the nursing notes.  Pertinent labs & imaging results that were available during my care of the patient were reviewed by me and considered in my medical decision making (see chart for details).     Patient presents with symptoms likely from a viral upper respiratory infection. Do not suspect underlying cardiopulmonary process. Symptoms seem unlikely related to ACS, CHF or COPD exacerbations, pneumonia, pneumothorax. Patient is nontoxic appearing and not in need of emergent medical intervention.  Rapid strep was negative.  Throat culture and COVID test pending.  Rapid flu was deferred given duration of symptoms as it would not change treatment.  Patient was tachycardic on initial triage.  Administered ibuprofen to decrease inflammation/fever.  Heart rate mildly decreased to 113 from 125 after ibuprofen administration.  Suspect tachycardia is due to inflammation/acute viral illness.  EKG was completed that  was unremarkable and showed no acute abnormality.  Advised patient to ensure adequate fluid hydration, alternate antipyretics, and follow-up tomorrow to have vital signs rechecked.  She is not in any acute distress so do not think that emergent evaluation is necessary.  Will obtain BMP and CBC as well.  Recommended symptom control with medications and supportive care as well.  Patient states understanding and is agreeable.  Discharged with PCP followup.  Final Clinical Impressions(s) / UC Diagnoses   Final diagnoses:  Viral upper respiratory tract infection with cough  Sore throat     Discharge Instructions      It appears that you have a viral illness causing your symptoms.  COVID test is pending.  Will call if it is abnormal.  Your rapid strep is negative.  Throat culture is pending.  I have prescribed you medications to alleviate symptoms.  Recommend adequate fluids and rest and following up tomorrow to have vital signs rechecked.     ED Prescriptions     Medication Sig Dispense Auth. Provider   benzonatate (TESSALON) 100 MG capsule Take 1 capsule (100 mg total) by mouth every 8 (eight) hours as needed for cough. 21 capsule [place], [place] E, [place]   fluticasone [facility]) 50 MCG/ACT nasal spray Place 1 spray into both nostrils daily. 16 g Teodora Medici, [place]  PDMP not reviewed this encounter.   Teodora Medici, [place] 08/16/22 2204

## 2022-08-16 NOTE — ED Triage Notes (Signed)
Patient c/o cough, body aches, headaches, congestion, sore throat x 2 days.  Patient has taken Mucinex, Tylenol and Delsym.

## 2022-08-17 ENCOUNTER — Other Ambulatory Visit: Payer: Self-pay

## 2022-08-17 ENCOUNTER — Ambulatory Visit
Admission: EM | Admit: 2022-08-17 | Discharge: 2022-08-17 | Disposition: A | Discharge status: Home / Self Care | Payer: Medicaid Other | Attending: Family Medicine | Admitting: Family Medicine

## 2022-08-17 VITALS — BP 142/79 | HR 95 | Temp 98.7°F | Resp 18

## 2022-08-17 DIAGNOSIS — Z09 Encounter for follow-up examination after completed treatment for conditions other than malignant neoplasm: Secondary | ICD-10-CM

## 2022-08-17 DIAGNOSIS — J069 Acute upper respiratory infection, unspecified: Secondary | ICD-10-CM

## 2022-08-17 LAB — SARS CORONAVIRUS 2 (TAT 6-24 HRS): SARS Coronavirus 2: NEGATIVE

## 2022-08-17 NOTE — ED Triage Notes (Signed)
Pt here for recheck of vital signs after being seen last night

## 2022-08-17 NOTE — ED Provider Notes (Signed)
[facility]    CSN: [id number] Arrival date & time: 08/17/22  1139      History   Chief Complaint Chief Complaint  Patient presents with   Follow-up    Entered by patient    HPI Kymberlie R Parde is a 42 y.o. female.   Patient presents today for follow-up examination and recheck of vital signs.  She presented yesterday and was diagnosed with viral illness.  Her heart rate was elevated to the 120s.  She was advised of supportive care and following up today to have vital signs rechecked.  She reports that she is feeling slightly improved.  Denies any recent fevers.  Has been taking medication as prescribed.     Past Medical History:  Diagnosis Date   Hypertension    Migraines    otc med prn   SVD (spontaneous vaginal delivery)    x 2    Patient Active Problem List   Diagnosis Date Noted   Dizziness 12/10/2019   Hypertension 02/01/2019   Allergic conjunctivitis 12/12/2015   Elevated glucose tolerance test 04/28/2015   Rash and nonspecific skin eruption 10/30/2012   Blepharoconjunctivitis 03/30/2011   Chronic headaches 10/08/2010   ALLERGIC RHINITIS 10/31/2008   OBESITY 08/02/2008   GERD 06/21/2008   DEPRESSIVE DISORDER, NOS 08/11/2006   Hemorrhoids 08/11/2006    Past Surgical History:  Procedure Laterality Date   CHOLECYSTECTOMY     Gallstones removed-Around 42 years of age   23 AND CURETTAGE OF UTERUS N/A 10/18/2014   Procedure: SUCTION DILATATION AND CURETTAGE;  Surgeon: Woodroe Mode, MD;  Location: [facility];  Service: Gynecology;  Laterality: N/A;   DILATION AND EVACUATION N/A 11/12/2016   Procedure: DILATATION AND EVACUATION;  Surgeon: Allyn Kenner, DO;  Location: [facility];  Service: Gynecology;  Laterality: N/A;    OB History   This patient's OB History needs to be verified. Open OB History to review and resolve any issues.  Gravida  5   Para  2   Term  2   Preterm      AB  3   Living  2      SAB  3   IAB      Ectopic       Multiple  0   Live Births  2        Obstetric Comments  D & C  After the last two miscarriages.  Approximate dates for the last two miscarriages.           Home Medications    Prior to Admission medications   Medication Sig Start Date End Date Taking? Authorizing Provider  amLODipine (NORVASC) 5 MG tablet TAKE 1 TABLET BY MOUTH EVERYDAY AT BEDTIME 08/27/21   Autry-Lott, Naaman Plummer, DO  benzonatate (TESSALON) 100 MG capsule Take 1 capsule (100 mg total) by mouth every 8 (eight) hours as needed for cough. 08/16/22   Teodora Medici, FNP  cetirizine (ZYRTEC) 10 MG tablet Take 1 tablet (10 mg total) by mouth daily. Patient not taking: Reported on 03/10/2021 12/17/20   Dickie La, MD  fluticasone [facility]) 50 MCG/ACT nasal spray Place 1 spray into both nostrils daily. 08/16/22   Teodora Medici, FNP  ibuprofen (ADVIL) 600 MG tablet Take 1 tablet (600 mg total) by mouth every 6 (six) hours as needed. 07/22/21   Jaynee Eagles, PA-C  medroxyPROGESTERone (DEPO-PROVERA) 150 MG/ML injection Present to office next week with syringe for injection 11/12/16   Allyn Kenner, DO  naproxen (  NAPROSYN) 500 MG tablet Take 1 tablet (500 mg total) by mouth 2 (two) times daily with a meal. 03/10/21   Cheyenne Adas, NP  tiZANidine (ZANAFLEX) 4 MG tablet Take 1 tablet (4 mg total) by mouth at bedtime. 07/22/21   Jaynee Eagles, PA-C    Family History Family History  Problem Relation Age of Onset   Hypertension Mother    Diabetes Mother    Hypertension Maternal Grandmother    Diabetes Maternal Grandmother    Alcohol abuse Father    Drug abuse Father    Cancer Father    Hypertension Sister    Drug abuse Brother    Alcohol abuse Brother    Acute myelogenous leukemia Neg Hx     Social History Social History   Tobacco Use   Smoking status: Never   Smokeless tobacco: Never  Vaping Use   Vaping Use: Never used  Substance Use Topics   Alcohol use: No   Drug use: No     Allergies   Patient has no  known allergies.   Review of Systems Review of Systems Per HPI  Physical Exam Triage Vital Signs ED Triage Vitals  Enc Vitals Group     BP 08/17/22 1150 (!) 142/79     Pulse Rate 08/17/22 1150 95     Resp 08/17/22 1150 18     Temp 08/17/22 1150 98.7 F (37.1 C)     Temp Source 08/17/22 1150 Oral     SpO2 08/17/22 1150 97 %     Weight --      Height --      Head Circumference --      Peak Flow --      Pain Score 08/17/22 1151 7     Pain Loc --      Pain Edu? --      Excl. in [place]? --    No data found.  Updated Vital Signs BP (!) 142/79 (BP Location: Left Arm)   Pulse 95   Temp 98.7 F (37.1 C) (Oral)   Resp 18   SpO2 97%   Visual Acuity Right Eye Distance:   Left Eye Distance:   Bilateral Distance:    Right Eye Near:   Left Eye Near:    Bilateral Near:     Physical Exam Constitutional:      General: She is not in acute distress.    Appearance: Normal appearance. She is not toxic-appearing or diaphoretic.  HENT:     Head: Normocephalic and atraumatic.     Right Ear: Tympanic membrane and ear canal normal.     Left Ear: Tympanic membrane and ear canal normal.     Nose: Congestion present.     Mouth/Throat:     Mouth: Mucous membranes are moist.     Pharynx: No posterior oropharyngeal erythema.  Eyes:     Extraocular Movements: Extraocular movements intact.     Conjunctiva/sclera: Conjunctivae normal.     Pupils: Pupils are equal, round, and reactive to light.  Cardiovascular:     Rate and Rhythm: Normal rate and regular rhythm.     Pulses: Normal pulses.     Heart sounds: Normal heart sounds.  Pulmonary:     Effort: Pulmonary effort is normal. No respiratory distress.     Breath sounds: Normal breath sounds. No stridor. No wheezing, rhonchi or rales.  Abdominal:     General: Abdomen is flat. Bowel sounds are normal.     Palpations: Abdomen is soft.  Musculoskeletal:        General: Normal range of motion.     Cervical back: Normal range of motion.   Skin:    General: Skin is warm and dry.  Neurological:     General: No focal deficit present.     Mental Status: She is alert and oriented to person, place, and time. Mental status is at baseline.  Psychiatric:        Mood and Affect: Mood normal.        Behavior: Behavior normal.      UC Treatments / Results  Labs (all labs ordered are listed, but only abnormal results are displayed) Labs Reviewed - No data to display  EKG   Radiology No results found.  Procedures Procedures (including critical care time)  Medications Ordered in UC Medications - No data to display  Initial Impression / Assessment and Plan / UC Course  I have reviewed the triage vital signs and the nursing notes.  Pertinent labs & imaging results that were available during my care of the patient were reviewed by me and considered in my medical decision making (see chart for details).     Vital signs are much improved and stable. Patient is safe for discharge home. Encouraged supportive care and symptom management.  Discussed return precautions.  Patient verbalized understanding and was agreeable with plan. Final Clinical Impressions(s) / UC Diagnoses   Final diagnoses:  Viral upper respiratory tract infection with cough  Follow-up examination   Discharge Instructions   None    ED Prescriptions   None    PDMP not reviewed this encounter.   Teodora Medici, [place] 08/17/22 1216

## 2022-08-18 LAB — CBC
Hematocrit: 40.9 % (ref 34.0–46.6)
Hemoglobin: 13.3 g/dL (ref 11.1–15.9)
MCH: 29.3 pg (ref 26.6–33.0)
MCHC: 32.5 g/dL (ref 31.5–35.7)
MCV: 90 fL (ref 79–97)
Platelets: 250 10*3/uL (ref 150–450)
RBC: 4.54 x10E6/uL (ref 3.77–5.28)
RDW: 13.1 % (ref 11.7–15.4)
WBC: 9.4 10*3/uL (ref 3.4–10.8)

## 2022-08-18 LAB — BASIC METABOLIC PANEL
BUN/Creatinine Ratio: 14 (ref 9–23)
BUN: 9 mg/dL (ref 6–24)
CO2: 21 mmol/L (ref 20–29)
Calcium: 9 mg/dL (ref 8.7–10.2)
Chloride: 106 mmol/L (ref 96–106)
Creatinine, Ser: 0.65 mg/dL (ref 0.57–1.00)
Glucose: 103 mg/dL — ABNORMAL HIGH (ref 70–99)
Potassium: 3.8 mmol/L (ref 3.5–5.2)
Sodium: 143 mmol/L (ref 134–144)
eGFR: 114 mL/min/{1.73_m2} (ref >59)

## 2022-08-19 LAB — CULTURE, GROUP A STREP (THRC)

## 2022-08-23 ENCOUNTER — Ambulatory Visit (HOSPITAL_COMMUNITY): Payer: Self-pay

## 2022-12-23 ENCOUNTER — Encounter (HOSPITAL_COMMUNITY): Payer: Self-pay

## 2022-12-23 ENCOUNTER — Ambulatory Visit (HOSPITAL_COMMUNITY)
Admission: RE | Admit: 2022-12-23 | Discharge: 2022-12-23 | Disposition: A | Discharge status: Home / Self Care | Payer: Medicaid Other | Source: Ambulatory Visit | Attending: Internal Medicine | Admitting: Internal Medicine

## 2022-12-23 VITALS — BP 138/85 | Temp 98.6°F | Resp 18 | Ht 60.0 in | Wt 179.9 lb

## 2022-12-23 DIAGNOSIS — B349 Viral infection, unspecified: Secondary | ICD-10-CM | POA: Diagnosis not present

## 2022-12-23 DIAGNOSIS — Z1152 Encounter for screening for COVID-19: Secondary | ICD-10-CM | POA: Diagnosis not present

## 2022-12-23 DIAGNOSIS — R42 Dizziness and giddiness: Secondary | ICD-10-CM | POA: Insufficient documentation

## 2022-12-23 MED ORDER — MECLIZINE HCL 12.5 MG PO TABS: 12.5000 mg | ORAL_TABLET | Freq: Three times a day (TID) | ORAL | 0 refills | Status: DC | PRN

## 2022-12-23 NOTE — ED Triage Notes (Signed)
Dizziness onset for a few days. Has been exposed to COVID as well, she found out yesterday and was around the person Monday. Patient having runny nose, body aches, and chills.   Took a home test that was negative. Felt worse this morning.

## 2022-12-23 NOTE — Discharge Instructions (Signed)
Today you were evaluated for dizziness -Unknown cause, it is common that some people experience dizziness without cause I do not believe at this time is related to your heart or your brain function -You may use meclizine 3 times daily as needed (every 8 hours) if helpful but does not resolve symptoms you may take 2 tablets at a time -Continue to change positions slowly, waiting at least 10 seconds between movements to help the body stabilize  -Ensure adequate intake of water as dehydration will make your symptoms worse -Schedule follow-up appointment with your primary doctor in 1 week if you continue to experience symptoms   -Your symptoms today are most likely being caused by a virus and should steadily improve in time it can take up to 7 to 10 days before you truly start to see a turnaround however things will get better -COVID test is pending up to 24 hours, you will be notified of positive test results only, current quarantine guidelines are only if having fever, if positive wear mask until all symptoms have resolved   -You can take Tylenol and/or Ibuprofen as needed for fever reduction and pain relief.  -For cough: honey 1/2 to 1 teaspoon (you can dilute the honey in water or another fluid).  You can also use guaifenesin and dextromethorphan for cough. You can use a humidifier for chest congestion and cough.  If you don't have a humidifier, you can sit in the bathroom with the hot shower running.     -For sore throat: try warm salt water gargles, cepacol lozenges, throat spray, warm tea or water with lemon/honey, popsicles or ice, or OTC cold relief medicine for throat discomfort.  -For congestion: take a daily anti-histamine like Zyrtec, Claritin, and a oral decongestant, such as pseudoephedrine.  You can also use Flonase 1-2 sprays in each nostril daily.  -It is important to stay hydrated: drink plenty of fluids (water, gatorade/powerade/pedialyte, juices, or teas) to keep your throat  moisturized and help further relieve irritation/discomfort.

## 2022-12-23 NOTE — ED Provider Notes (Signed)
[facility]    CSN: [id number] Arrival date & time: 12/23/22  4098      History   Chief Complaint Chief Complaint  Patient presents with   Dizziness    Entered by patient   Covid Exposure    HPI Erin Osborn is a 42 y.o. female.   Patient presents for evaluation of dizziness, nasal congestion, rhinorrhea, chills and bodyaches present for 3 days.  Dizziness is described as feeling imbalance, worsened with changes in positions, started abruptly.  Feels symptoms worsened today.  abdominal pain Exposure to COVID on Monday, briefly around the person that she will pass them in a small hallway.  Endorses that while on vacation within the last 2 weeks that she did miss a couple days of blood pressure medications but restarted upon return home.  Denies fever, nausea vomiting, visual changes, headache, lightheadedness, syncope, shortness of breath, wheeze.Marland Kitchen     Patient Active Problem List   Diagnosis Date Noted   Dizziness 12/10/2019   Hypertension 02/01/2019   Allergic conjunctivitis 12/12/2015   Elevated glucose tolerance test 04/28/2015   Rash and nonspecific skin eruption 10/30/2012   Blepharoconjunctivitis 03/30/2011   Chronic headaches 10/08/2010   ALLERGIC RHINITIS 10/31/2008   OBESITY 08/02/2008   GERD 06/21/2008   DEPRESSIVE DISORDER, NOS 08/11/2006   Hemorrhoids 08/11/2006    Past Surgical History:  Procedure Laterality Date   CHOLECYSTECTOMY     Gallstones removed-Around 42 years of age   DILATION AND CURETTAGE OF UTERUS N/A 10/18/2014   Procedure: SUCTION DILATATION AND CURETTAGE;  Surgeon: Adam Phenix, MD;  Location: [facility];  Service: Gynecology;  Laterality: N/A;   DILATION AND EVACUATION N/A 11/12/2016   Procedure: DILATATION AND EVACUATION;  Surgeon: Philip Aspen, DO;  Location: [facility];  Service: Gynecology;  Laterality: N/A;    OB History   This patient's OB History needs to be verified. Open OB History to review and resolve any issues.   Gravida  5   Para  2   Term  2   Preterm      AB  3   Living  2      SAB  3   IAB      Ectopic      Multiple  0   Live Births  2        Obstetric Comments  D & C  After the last two miscarriages.  Approximate dates for the last two miscarriages.           Home Medications    Prior to Admission medications   Medication Sig Start Date End Date Taking? Authorizing Provider  amLODipine (NORVASC) 5 MG tablet TAKE 1 TABLET BY MOUTH EVERYDAY AT BEDTIME 08/27/21  Yes Autry-Lott, Simone, DO  benzonatate (TESSALON) 100 MG capsule Take 1 capsule (100 mg total) by mouth every 8 (eight) hours as needed for cough. 08/16/22   Gustavus Bryant, FNP  cetirizine (ZYRTEC) 10 MG tablet Take 1 tablet (10 mg total) by mouth daily. Patient not taking: Reported on 03/10/2021 12/17/20   Nestor Ramp, MD  fluticasone [facility]) 50 MCG/ACT nasal spray Place 1 spray into both nostrils daily. 08/16/22   Gustavus Bryant, FNP  ibuprofen (ADVIL) 600 MG tablet Take 1 tablet (600 mg total) by mouth every 6 (six) hours as needed. 07/22/21   Wallis Bamberg, PA-C  medroxyPROGESTERone (DEPO-PROVERA) 150 MG/ML injection Present to office next week with syringe for injection 11/12/16   Philip Aspen, DO  naproxen (NAPROSYN) 500 MG tablet Take 1 tablet (500 mg total) by mouth 2 (two) times daily with a meal. 03/10/21   Jeannine Boga, NP  tiZANidine (ZANAFLEX) 4 MG tablet Take 1 tablet (4 mg total) by mouth at bedtime. 07/22/21   Wallis Bamberg, PA-C    Family History Family History  Problem Relation Age of Onset   Hypertension Mother    Diabetes Mother    Hypertension Maternal Grandmother    Diabetes Maternal Grandmother    Alcohol abuse Father    Drug abuse Father    Cancer Father    Hypertension Sister    Drug abuse Brother    Alcohol abuse Brother    Acute myelogenous leukemia Neg Hx     Social History Social History   Tobacco Use   Smoking status: Never   Smokeless tobacco: Never  Vaping Use    Vaping status: Never Used  Substance Use Topics   Alcohol use: No   Drug use: No     Allergies   Patient has no known allergies.   Review of Systems Review of Systems Defer HPI    Physical Exam Triage Vital Signs ED Triage Vitals  Encounter Vitals Group     BP 12/23/22 0857 138/85     Systolic BP Percentile --      Diastolic BP Percentile --      Pulse --      Resp 12/23/22 0857 18     Temp 12/23/22 0857 98.6 F (37 C)     Temp Source 12/23/22 0857 Oral     SpO2 12/23/22 0857 97 %     Weight 12/23/22 0856 179 lb 14.3 oz (81.6 kg)     Height 12/23/22 0856 5' (1.524 m)     Head Circumference --      Peak Flow --      Pain Score 12/23/22 0854 4     Pain Loc --      Pain Education --      Exclude from Growth Chart --    No data found.  Updated Vital Signs BP 138/85 (BP Location: Left Arm)   Temp 98.6 F (37 C) (Oral)   Resp 18   Ht 5' (1.524 m)   Wt 179 lb 14.3 oz (81.6 kg)   LMP 11/23/2022 (Exact Date)   SpO2 97%   BMI 35.13 kg/m   Visual Acuity Right Eye Distance:   Left Eye Distance:   Bilateral Distance:    Right Eye Near:   Left Eye Near:    Bilateral Near:     Physical Exam Constitutional:      Appearance: Normal appearance.  HENT:     Right Ear: Tympanic membrane, ear canal and external ear normal.     Left Ear: Tympanic membrane, ear canal and external ear normal.     Nose: Congestion present. No rhinorrhea.     Mouth/Throat:     Mouth: Mucous membranes are moist.     Pharynx: Oropharynx is clear. No posterior oropharyngeal erythema.  Eyes:     Extraocular Movements: Extraocular movements intact.     Conjunctiva/sclera: Conjunctivae normal.     Pupils: Pupils are equal, round, and reactive to light.  Cardiovascular:     Rate and Rhythm: Normal rate and regular rhythm.     Pulses: Normal pulses.     Heart sounds: Normal heart sounds.  Pulmonary:     Effort: Pulmonary effort is normal.     Breath sounds: Normal breath  sounds.   Skin:    General: Skin is warm and dry.  Neurological:     General: No focal deficit present.     Mental Status: She is alert and oriented to person, place, and time.     Cranial Nerves: No cranial nerve deficit.     Sensory: No sensory deficit.     Motor: No weakness.     Coordination: Coordination normal.     Gait: Gait normal.      UC Treatments / Results  Labs (all labs ordered are listed, but only abnormal results are displayed) Labs Reviewed  SARS CORONAVIRUS 2 (TAT 6-24 HRS)    EKG   Radiology No results found.  Procedures Procedures (including critical care time)  Medications Ordered in UC Medications - No data to display  Initial Impression / Assessment and Plan / UC Course  I have reviewed the triage vital signs and the nursing notes.  Pertinent labs & imaging results that were available during my care of the patient were reviewed by me and considered in my medical decision making (see chart for details).  Dizziness , Viral Illness  Unknown etiology of dizziness, most likely vertigo, no neurological deficits, blood pressure stable at this time, no signs of hypertensive urgency, no abnormalities to the ears, prescribed meclizine and recommended supportive measures for home management, symptoms persist an additional week advised follow-up with PCP for reevaluation  Vital signs are stable patient is in no signs of distress, COVID test is pending, discussed quarantine guidelines per CDC, recommended over-the-counter medications for supportive management, work note given, Follow-up with urgent care as needed Final Clinical Impressions(s) / UC Diagnoses   Final diagnoses:  None   Discharge Instructions   None    ED Prescriptions   None    PDMP not reviewed this encounter.   Valinda Hoar, NP 12/23/22 [phone]

## 2022-12-24 LAB — SARS CORONAVIRUS 2 (TAT 6-24 HRS): SARS Coronavirus 2: NEGATIVE

## 2023-01-11 ENCOUNTER — Encounter: Payer: Self-pay | Admitting: Family Medicine

## 2023-01-11 ENCOUNTER — Other Ambulatory Visit: Payer: Self-pay

## 2023-01-11 ENCOUNTER — Ambulatory Visit (INDEPENDENT_AMBULATORY_CARE_PROVIDER_SITE_OTHER): Payer: Medicaid Other | Admitting: Family Medicine

## 2023-01-11 VITALS — BP 128/78 | HR 79 | Ht 60.0 in | Wt 212.8 lb

## 2023-01-11 DIAGNOSIS — R1031 Right lower quadrant pain: Secondary | ICD-10-CM | POA: Diagnosis present

## 2023-01-11 DIAGNOSIS — R300 Dysuria: Secondary | ICD-10-CM | POA: Diagnosis not present

## 2023-01-11 DIAGNOSIS — Z131 Encounter for screening for diabetes mellitus: Secondary | ICD-10-CM

## 2023-01-11 LAB — POCT URINALYSIS DIP (MANUAL ENTRY)
Bilirubin, UA: NEGATIVE
Glucose, UA: NEGATIVE mg/dL
Ketones, POC UA: NEGATIVE mg/dL
Leukocytes, UA: NEGATIVE
Nitrite, UA: NEGATIVE
Protein Ur, POC: NEGATIVE mg/dL
Spec Grav, UA: >=1.03 — AB (ref 1.010–1.025)
Urobilinogen, UA: 1 E.U./dL
pH, UA: 6 (ref 5.0–8.0)

## 2023-01-11 LAB — POCT GLYCOSYLATED HEMOGLOBIN (HGB A1C): Hemoglobin A1C: 5.9 % — AB (ref 4.0–5.6)

## 2023-01-11 NOTE — Progress Notes (Signed)
SUBJECTIVE:   CHIEF COMPLAINT / HPI:  Erin Osborn is a 42 y.o. female with a pertinent past medical history of HTN, obesity, and GERD presenting to the clinic for back and lower abdominal pain.  Right back and right pelvic pain Went to [place] last Saturday, was told she has some lumbar arthritis after an Xray. Pain has improved since going to [place], was given 7 days of "pain medicine" that started with P, likely prednisone. For the past week, has been urinating more, "every few minutes," some lower right abdomen and lower right back pain.  Has cut back on sodas and tea. No blood in stool or urine.  No constipation.  No nausea/vomiting/diarrhea. No systemic symptoms such as fevers, bone aches, night sweats. Not interested in STI testing.  PERTINENT PMH / PSH:  Sexually active with a single monogamous partner, uses OCPs for contraception, no barrier method.   OBJECTIVE:   BP 128/78   Pulse 79   Ht 5' (1.524 m)   Wt 212 lb 12.8 oz (96.5 kg)   LMP 11/23/2022 (Exact Date)   SpO2 100%   BMI 41.56 kg/m   General: Age-appropriate, resting comfortably in chair, NAD, alert and at baseline. Abdominal: No tenderness to deep or light palpation. No rebound or guarding. No HSM.  No suprapubic tenderness.  Moderate tenderness over R lateral lumbar region, possibly CVA tenderness.  Negative Rovsing. MSK: Pain with slouching, bending over in R lumbar region.  Tender over R lateral lumbar muscles.  No pain in L lateral lumbar region. Skin: Warm and dry. Extremities: No peripheral edema bilaterally. Capillary refill <2 seconds.  Results for orders placed or performed in visit on 01/11/23 (from the past 48 hour(s))  POCT urinalysis dipstick     Status: Abnormal   Collection Time: 01/11/23  4:15 PM  Result Value Ref Range   Color, UA yellow yellow   Clarity, UA clear clear   Glucose, UA negative negative mg/dL   Bilirubin, UA negative negative   Ketones, POC UA negative negative  mg/dL   Spec Grav, UA >=1.308 (A) 1.010 - 1.025   Blood, UA trace-intact (A) negative   pH, UA 6.0 5.0 - 8.0   Protein Ur, POC negative negative mg/dL   Urobilinogen, UA 1.0 0.2 or 1.0 E.U./dL   Nitrite, UA Negative Negative   Leukocytes, UA Negative Negative  HgB A1c     Status: Abnormal   Collection Time: 01/11/23  4:45 PM  Result Value Ref Range   Hemoglobin A1C 5.9 (A) 4.0 - 5.6 %   HbA1c POC (<> result, manual entry)     HbA1c, POC (prediabetic range)     HbA1c, POC (controlled diabetic range)      ASSESSMENT/PLAN:   Problem List Items Addressed This Visit       Other   Right lower quadrant abdominal pain - Primary    Differential includes passed kidney stone versus other unclear abdominal pain.  Suspect back pain is more due to chronic arthritis and MSK issues than renal etiology.  Suspicion for kidney stone given colicky abdominal pain, worse exacerbation last week followed by improvement, and small blood in UA.  In absence of urinary or vaginal symptoms, low concern for STI and UTI.  Urine further clean, suggesting no UTI/pyelo.  A1c not diabetic range.  Will CTM, obtain labs to rule out more concerning renal etiologies. - CMP - Instructed patient to keep symptom diary and follow up in 1 month, or sooner if worsening  Relevant Orders   Comprehensive metabolic panel   Urinalysis, microscopic only   Other Visit Diagnoses     Dysuria       Relevant Orders   POCT urinalysis dipstick (Completed)   Diabetes mellitus screening       Relevant Orders   HgB A1c (Completed)      Return in about 1 month (around 02/11/2023) for R L abdomen pain f/u.  Erin Espiritu Sharion Dove, MD [facility]

## 2023-01-11 NOTE — Patient Instructions (Addendum)
It was great to see you today! Thank you for choosing [facility] for your primary care.  Today we addressed:  Right lower quadrant abdominal pain We are not sure what is causing this pain, though it may have been a kidney stone that you have passed.  Your urine is reassuring because I do not think you have an infection.  Please keep a diary of when your stomach symptoms happen (what you were doing, eating, etc) and come back in a month to reassess.  If the symptoms get WORSE before then, call and come back sooner.  We are checking some labs today, including metabolic labs.  You will get a MyChart message or a letter if results are normal. Otherwise, you will get a call from [place].  You should return to our clinic in 1 month.  Thank you for coming to see [place] at [facility] and for the opportunity to care for you! Toshio Slusher, MD 01/11/2023, 4:39 PM

## 2023-01-11 NOTE — Assessment & Plan Note (Signed)
Differential includes passed kidney stone versus other unclear abdominal pain.  Suspect back pain is more due to chronic arthritis and MSK issues than renal etiology.  Suspicion for kidney stone given colicky abdominal pain, worse exacerbation last week followed by improvement, and small blood in UA.  In absence of urinary or vaginal symptoms, low concern for STI and UTI.  Urine further clean, suggesting no UTI/pyelo.  A1c not diabetic range.  Will CTM, obtain labs to rule out more concerning renal etiologies. - CMP - Instructed patient to keep symptom diary and follow up in 1 month, or sooner if worsening

## 2023-04-19 ENCOUNTER — Ambulatory Visit (INDEPENDENT_AMBULATORY_CARE_PROVIDER_SITE_OTHER): Payer: Medicaid Other

## 2023-04-19 DIAGNOSIS — Z23 Encounter for immunization: Secondary | ICD-10-CM | POA: Diagnosis present

## 2023-04-19 NOTE — Progress Notes (Signed)
Patient presents with daughter to visit today with Sherrilee Gilles. She requested to get her Flu vaccine.  Vaccine given without complication. See admin for details.

## 2023-06-24 ENCOUNTER — Ambulatory Visit: Payer: Self-pay

## 2024-02-03 ENCOUNTER — Ambulatory Visit: Payer: Self-pay

## 2024-02-16 ENCOUNTER — Encounter: Payer: Self-pay | Admitting: Nurse Practitioner

## 2024-02-16 ENCOUNTER — Ambulatory Visit: Admitting: Nurse Practitioner

## 2024-02-16 VITALS — BP 124/78 | HR 73 | Temp 97.8°F

## 2024-02-16 DIAGNOSIS — I10 Essential (primary) hypertension: Secondary | ICD-10-CM

## 2024-02-16 DIAGNOSIS — J069 Acute upper respiratory infection, unspecified: Secondary | ICD-10-CM

## 2024-02-16 MED ORDER — AMLODIPINE BESYLATE 5 MG PO TABS: 5.0000 mg | ORAL_TABLET | Freq: Every day | ORAL | 0 refills | Status: AC

## 2024-02-16 MED ORDER — BENZONATATE 100 MG PO CAPS: 100.0000 mg | ORAL_CAPSULE | Freq: Three times a day (TID) | ORAL | 0 refills | Status: AC | PRN

## 2024-02-16 NOTE — Progress Notes (Signed)
 [facility]  Acute Office Visit  Subjective:     Patient ID: Erin Osborn, female    DOB: [birth year], 43 y.o.   MRN: [id number]  Chief Complaint  Patient presents with   URI   Patient is in today for upper respiratory infection since last Friday.  Endorsed cough, runny nose, sore throat. Denies fever or chills. She was seen at [facility] on Friday and tested negative for flu and COVID. Was encouraged to take sudafed and ibuprofen .  Symptoms have improved but she is still congested and has dry cough.  She taking robitussin but this has not been helpful.   Would also like refill on amlodipine . Has been on this for 5 years. No concerns. She does have a PCP who has been filling this.   Review of Systems  Constitutional:  Negative for chills, fever and malaise/fatigue.  HENT:  Positive for congestion. Negative for sinus pain and sore throat.   Respiratory:  Positive for cough. Negative for sputum production and shortness of breath.   Cardiovascular:  Negative for chest pain.  Musculoskeletal:  Negative for myalgias.  Neurological:  Negative for weakness and headaches.        Objective:    BP 124/78 (BP Location: Left Arm, Patient Position: Sitting, Cuff Size: Normal)   Pulse 73   Temp 97.8 F (36.6 C) (Temporal)   LMP 02/01/2024 (Approximate)   SpO2 98%    Physical Exam Constitutional:      General: She is not in acute distress. HENT:     Head: Normocephalic.     Right Ear: Tympanic membrane, ear canal and external ear normal.     Left Ear: Tympanic membrane, ear canal and external ear normal.     Nose: Congestion present.     Mouth/Throat:     Mouth: Mucous membranes are moist.     Pharynx: Oropharynx is clear. No oropharyngeal exudate or posterior oropharyngeal erythema.  Eyes:     Pupils: Pupils are equal, round, and reactive to light.  Cardiovascular:     Rate and Rhythm: Normal rate and regular rhythm.     Heart sounds: Normal  heart sounds.  Pulmonary:     Effort: Pulmonary effort is normal. No respiratory distress.     Breath sounds: Normal breath sounds.  Musculoskeletal:        General: Normal range of motion.     Cervical back: Normal range of motion.  Lymphadenopathy:     Cervical: No cervical adenopathy.  Skin:    General: Skin is warm.  Neurological:     General: No focal deficit present.     Mental Status: She is alert and oriented to person, place, and time.     No results found for any visits on 02/16/24.      Assessment & Plan:   Problem List Items Addressed This Visit       Cardiovascular and Mediastinum   Hypertension - Primary   Relevant Medications   amLODipine  (NORVASC ) 5 MG tablet   Other Visit Diagnoses       Viral upper respiratory tract infection       Relevant Medications   benzonatate  (TESSALON ) 100 MG capsule  BP is stable. Amlodipine  refilled today.  URI likely viral. Symptoms are improving. Encouraged continued supportive measures including use of mucinex, flonase , and tessalon  perles as needed.      Meds ordered this encounter  Medications   amLODipine  (NORVASC ) 5 MG tablet    Sig:  Take 1 tablet (5 mg total) by mouth daily.    Dispense:  90 tablet    Refill:  0    Supervising Provider:   BACIGALUPO, ANGELA M [[id number]]   benzonatate  (TESSALON ) 100 MG capsule    Sig: Take 1 capsule (100 mg total) by mouth 3 (three) times daily as needed for cough.    Dispense:  20 capsule    Refill:  0    Supervising Provider:   MYRLA JON HERO [[id number]]    Return if symptoms worsen or fail to improve.  Hubert Derstine Flores Keilly Fatula, NP
